# Patient Record
Sex: Female | Born: 1965 | Race: Black or African American | Hispanic: No | Marital: Single | Smoking: Current every day smoker
Health system: Southern US, Community
[De-identification: ages and names within clinical notes are randomized; demographics above are authoritative.]

## PROBLEM LIST (undated history)

## (undated) DIAGNOSIS — E8881 Metabolic syndrome: Secondary | ICD-10-CM

## (undated) DIAGNOSIS — Z923 Personal history of irradiation: Secondary | ICD-10-CM

## (undated) DIAGNOSIS — G4700 Insomnia, unspecified: Secondary | ICD-10-CM

## (undated) DIAGNOSIS — R519 Headache, unspecified: Secondary | ICD-10-CM

## (undated) DIAGNOSIS — R51 Headache: Secondary | ICD-10-CM

## (undated) DIAGNOSIS — C50919 Malignant neoplasm of unspecified site of unspecified female breast: Secondary | ICD-10-CM

## (undated) DIAGNOSIS — G629 Polyneuropathy, unspecified: Secondary | ICD-10-CM

## (undated) DIAGNOSIS — Z9221 Personal history of antineoplastic chemotherapy: Secondary | ICD-10-CM

## (undated) DIAGNOSIS — E669 Obesity, unspecified: Secondary | ICD-10-CM

## (undated) DIAGNOSIS — T7840XA Allergy, unspecified, initial encounter: Secondary | ICD-10-CM

## (undated) DIAGNOSIS — M1990 Unspecified osteoarthritis, unspecified site: Secondary | ICD-10-CM

## (undated) DIAGNOSIS — I10 Essential (primary) hypertension: Secondary | ICD-10-CM

## (undated) DIAGNOSIS — J449 Chronic obstructive pulmonary disease, unspecified: Secondary | ICD-10-CM

## (undated) HISTORY — DX: Insomnia, unspecified: G47.00

## (undated) HISTORY — DX: Polyneuropathy, unspecified: G62.9

## (undated) HISTORY — DX: Allergy, unspecified, initial encounter: T78.40XA

## (undated) HISTORY — DX: Metabolic syndrome: E88.81

## (undated) HISTORY — DX: Metabolic syndrome: E88.810

## (undated) HISTORY — PX: BREAST SURGERY: SHX581

## (undated) HISTORY — DX: Unspecified osteoarthritis, unspecified site: M19.90

## (undated) HISTORY — DX: Chronic obstructive pulmonary disease, unspecified: J44.9

## (undated) HISTORY — DX: Essential (primary) hypertension: I10

## (undated) HISTORY — DX: Obesity, unspecified: E66.9

---

## 2005-07-23 HISTORY — PX: BLADDER SURGERY: SHX569

## 2006-01-10 ENCOUNTER — Ambulatory Visit: Payer: Self-pay

## 2008-03-19 ENCOUNTER — Ambulatory Visit: Payer: Self-pay | Admitting: Family Medicine

## 2011-02-21 DIAGNOSIS — C50212 Malignant neoplasm of upper-inner quadrant of left female breast: Secondary | ICD-10-CM | POA: Insufficient documentation

## 2011-05-02 ENCOUNTER — Ambulatory Visit: Payer: Self-pay | Admitting: Family Medicine

## 2011-07-03 ENCOUNTER — Ambulatory Visit: Payer: Self-pay | Admitting: Surgery

## 2011-07-12 LAB — PATHOLOGY REPORT

## 2011-07-13 ENCOUNTER — Ambulatory Visit: Payer: Self-pay | Admitting: Oncology

## 2011-07-24 ENCOUNTER — Ambulatory Visit: Payer: Self-pay | Admitting: Oncology

## 2011-07-24 DIAGNOSIS — C50919 Malignant neoplasm of unspecified site of unspecified female breast: Secondary | ICD-10-CM

## 2011-07-24 HISTORY — PX: BREAST LUMPECTOMY: SHX2

## 2011-07-24 HISTORY — PX: BREAST BIOPSY: SHX20

## 2011-07-24 HISTORY — DX: Malignant neoplasm of unspecified site of unspecified female breast: C50.919

## 2011-07-27 ENCOUNTER — Ambulatory Visit: Payer: Self-pay | Admitting: Surgery

## 2011-07-27 DIAGNOSIS — I10 Essential (primary) hypertension: Secondary | ICD-10-CM

## 2011-07-27 LAB — CBC WITH DIFFERENTIAL/PLATELET
Basophil #: 0 10*3/uL (ref 0.0–0.1)
HGB: 12.6 g/dL (ref 12.0–16.0)
Lymphocyte #: 2.3 10*3/uL (ref 1.0–3.6)
MCHC: 32.9 g/dL (ref 32.0–36.0)
MCV: 86 fL (ref 80–100)
Monocyte %: 7.4 %
Neutrophil #: 4.7 10*3/uL (ref 1.4–6.5)
Neutrophil %: 59.1 %
RBC: 4.48 10*6/uL (ref 3.80–5.20)
RDW: 15.4 % — ABNORMAL HIGH (ref 11.5–14.5)

## 2011-07-27 LAB — PREGNANCY, URINE: Pregnancy Test, Urine: NEGATIVE m[IU]/mL

## 2011-07-27 LAB — BASIC METABOLIC PANEL
Anion Gap: 8 (ref 7–16)
BUN: 10 mg/dL (ref 7–18)
Co2: 27 mmol/L (ref 21–32)
Creatinine: 0.84 mg/dL (ref 0.60–1.30)
EGFR (African American): >60
EGFR (Non-African Amer.): >60
Glucose: 101 mg/dL — ABNORMAL HIGH (ref 65–99)
Osmolality: 284 (ref 275–301)
Sodium: 143 mmol/L (ref 136–145)

## 2011-08-24 ENCOUNTER — Ambulatory Visit: Payer: Self-pay | Admitting: Oncology

## 2011-08-30 ENCOUNTER — Ambulatory Visit: Payer: Self-pay | Admitting: Surgery

## 2011-08-30 LAB — CBC WITH DIFFERENTIAL/PLATELET
Basophil #: 0.1 10*3/uL (ref 0.0–0.1)
Basophil %: 0.6 %
Eosinophil #: 0.4 10*3/uL (ref 0.0–0.7)
Eosinophil %: 3.3 %
HGB: 12.9 g/dL (ref 12.0–16.0)
Lymphocyte #: 3.2 10*3/uL (ref 1.0–3.6)
Lymphocyte %: 29.3 %
MCH: 28.2 pg (ref 26.0–34.0)
Monocyte #: 0.6 10*3/uL (ref 0.0–0.7)
Monocyte %: 5.3 %
Neutrophil %: 61.5 %
Platelet: 275 10*3/uL (ref 150–440)
RBC: 4.56 10*6/uL (ref 3.80–5.20)
RDW: 14.8 % — ABNORMAL HIGH (ref 11.5–14.5)
WBC: 10.8 10*3/uL (ref 3.6–11.0)

## 2011-08-30 LAB — BASIC METABOLIC PANEL
Calcium, Total: 8.9 mg/dL (ref 8.5–10.1)
Chloride: 106 mmol/L (ref 98–107)
EGFR (Non-African Amer.): >60

## 2011-08-30 LAB — PREGNANCY, URINE: Pregnancy Test, Urine: NEGATIVE m[IU]/mL

## 2011-09-03 ENCOUNTER — Ambulatory Visit: Payer: Self-pay | Admitting: Surgery

## 2011-09-03 LAB — POTASSIUM: Potassium: 3.9 mmol/L (ref 3.5–5.1)

## 2011-09-03 LAB — PREGNANCY, URINE: Pregnancy Test, Urine: NEGATIVE m[IU]/mL

## 2011-09-14 LAB — BUN: BUN: 14 mg/dL (ref 7–18)

## 2011-09-14 LAB — CREATININE, SERUM
EGFR (African American): >60
EGFR (Non-African Amer.): >60

## 2011-09-19 ENCOUNTER — Ambulatory Visit: Payer: Self-pay | Admitting: Vascular Surgery

## 2011-09-21 ENCOUNTER — Ambulatory Visit: Payer: Self-pay | Admitting: Oncology

## 2011-09-21 LAB — CBC CANCER CENTER
Basophil #: 0.2 x10 3/mm — ABNORMAL HIGH (ref 0.0–0.1)
Basophil %: 1.8 %
Eosinophil #: 0.9 x10 3/mm — ABNORMAL HIGH (ref 0.0–0.7)
Eosinophil %: 9.8 %
HCT: 39.4 % (ref 35.0–47.0)
Lymphocyte #: 2.3 x10 3/mm (ref 1.0–3.6)
Lymphocyte %: 25.7 %
MCH: 27.9 pg (ref 26.0–34.0)
MCV: 84 fL (ref 80–100)
Monocyte #: 0.4 x10 3/mm (ref 0.0–0.7)
Platelet: 253 x10 3/mm (ref 150–440)
RBC: 4.72 10*6/uL (ref 3.80–5.20)
RDW: 15.3 % — ABNORMAL HIGH (ref 11.5–14.5)
WBC: 9.1 x10 3/mm (ref 3.6–11.0)

## 2011-09-21 LAB — COMPREHENSIVE METABOLIC PANEL
Albumin: 3.4 g/dL (ref 3.4–5.0)
Alkaline Phosphatase: 90 U/L (ref 50–136)
BUN: 11 mg/dL (ref 7–18)
Bilirubin,Total: 0.2 mg/dL (ref 0.2–1.0)
Calcium, Total: 8.6 mg/dL (ref 8.5–10.1)
Co2: 25 mmol/L (ref 21–32)
Creatinine: 0.99 mg/dL (ref 0.60–1.30)
EGFR (African American): >60
Osmolality: 282 (ref 275–301)
SGOT(AST): 20 U/L (ref 15–37)
SGPT (ALT): 35 U/L
Total Protein: 7 g/dL (ref 6.4–8.2)

## 2011-09-28 LAB — CBC CANCER CENTER
Basophil #: 0.1 x10 3/mm (ref 0.0–0.1)
Basophil %: 1.3 %
Eosinophil #: 0.2 x10 3/mm (ref 0.0–0.7)
HCT: 37.2 % (ref 35.0–47.0)
HGB: 12.6 g/dL (ref 12.0–16.0)
Lymphocyte #: 2.1 x10 3/mm (ref 1.0–3.6)
Lymphocyte %: 45.6 %
MCH: 28.1 pg (ref 26.0–34.0)
MCHC: 33.8 g/dL (ref 32.0–36.0)
Monocyte #: 0.1 x10 3/mm (ref 0.0–0.7)
Neutrophil #: 2.1 x10 3/mm (ref 1.4–6.5)
Neutrophil %: 46 %
RDW: 14.8 % — ABNORMAL HIGH (ref 11.5–14.5)

## 2011-09-28 LAB — BASIC METABOLIC PANEL
BUN: 11 mg/dL (ref 7–18)
Calcium, Total: 8.3 mg/dL — ABNORMAL LOW (ref 8.5–10.1)
Co2: 24 mmol/L (ref 21–32)
EGFR (Non-African Amer.): >60
Glucose: 120 mg/dL — ABNORMAL HIGH (ref 65–99)
Potassium: 3.5 mmol/L (ref 3.5–5.1)
Sodium: 139 mmol/L (ref 136–145)

## 2011-10-05 LAB — CBC CANCER CENTER
Basophil #: 0 x10 3/mm (ref 0.0–0.1)
Eosinophil #: 0 x10 3/mm (ref 0.0–0.7)
HCT: 36.5 % (ref 35.0–47.0)
Lymphocyte #: 1.9 x10 3/mm (ref 1.0–3.6)
Lymphocyte %: 14.8 %
MCHC: 32.9 g/dL (ref 32.0–36.0)
MCV: 84 fL (ref 80–100)
Neutrophil #: 10.5 x10 3/mm — ABNORMAL HIGH (ref 1.4–6.5)
RDW: 14.9 % — ABNORMAL HIGH (ref 11.5–14.5)

## 2011-10-05 LAB — COMPREHENSIVE METABOLIC PANEL
Albumin: 3.3 g/dL — ABNORMAL LOW (ref 3.4–5.0)
Alkaline Phosphatase: 93 U/L (ref 50–136)
Anion Gap: 11 (ref 7–16)
Calcium, Total: 8 mg/dL — ABNORMAL LOW (ref 8.5–10.1)
EGFR (African American): >60
Glucose: 136 mg/dL — ABNORMAL HIGH (ref 65–99)
SGOT(AST): 20 U/L (ref 15–37)
SGPT (ALT): 32 U/L

## 2011-10-18 LAB — CBC CANCER CENTER
Basophil #: 0 x10 3/mm (ref 0.0–0.1)
Eosinophil #: 0 x10 3/mm (ref 0.0–0.7)
Eosinophil %: 0.1 %
HCT: 37.4 % (ref 35.0–47.0)
HGB: 12.5 g/dL (ref 12.0–16.0)
Lymphocyte %: 12.2 %
MCH: 27.9 pg (ref 26.0–34.0)
MCHC: 33.5 g/dL (ref 32.0–36.0)
Monocyte #: 0.9 x10 3/mm — ABNORMAL HIGH (ref 0.0–0.7)
Monocyte %: 5.7 %
Neutrophil #: 13.2 x10 3/mm — ABNORMAL HIGH (ref 1.4–6.5)
Neutrophil %: 82 %
Platelet: 199 x10 3/mm (ref 150–440)
RBC: 4.48 10*6/uL (ref 3.80–5.20)
RDW: 15.7 % — ABNORMAL HIGH (ref 11.5–14.5)
WBC: 16.1 x10 3/mm — ABNORMAL HIGH (ref 3.6–11.0)

## 2011-10-18 LAB — COMPREHENSIVE METABOLIC PANEL
Albumin: 3.6 g/dL (ref 3.4–5.0)
BUN: 10 mg/dL (ref 7–18)
Bilirubin,Total: 0.2 mg/dL (ref 0.2–1.0)
Calcium, Total: 8.6 mg/dL (ref 8.5–10.1)
Chloride: 106 mmol/L (ref 98–107)
Co2: 27 mmol/L (ref 21–32)
Creatinine: 0.82 mg/dL (ref 0.60–1.30)
EGFR (African American): >60
Osmolality: 282 (ref 275–301)
Potassium: 4 mmol/L (ref 3.5–5.1)
SGPT (ALT): 38 U/L
Sodium: 142 mmol/L (ref 136–145)
Total Protein: 6.8 g/dL (ref 6.4–8.2)

## 2011-10-22 ENCOUNTER — Ambulatory Visit: Payer: Self-pay | Admitting: Oncology

## 2011-10-25 LAB — CBC CANCER CENTER
Basophil #: 0 x10 3/mm (ref 0.0–0.1)
Eosinophil #: 0 x10 3/mm (ref 0.0–0.7)
HCT: 35.3 % (ref 35.0–47.0)
HGB: 12 g/dL (ref 12.0–16.0)
MCH: 28.1 pg (ref 26.0–34.0)
MCHC: 33.9 g/dL (ref 32.0–36.0)
MCV: 83 fL (ref 80–100)
Monocyte #: 0.2 x10 3/mm (ref 0.0–0.7)
Monocyte %: 3.4 %
Neutrophil #: 3.4 x10 3/mm (ref 1.4–6.5)
Neutrophil %: 70.8 %
Platelet: 224 x10 3/mm (ref 150–440)
RBC: 4.26 10*6/uL (ref 3.80–5.20)
RDW: 16 % — ABNORMAL HIGH (ref 11.5–14.5)

## 2011-11-01 LAB — COMPREHENSIVE METABOLIC PANEL
Alkaline Phosphatase: 94 U/L (ref 50–136)
BUN: 11 mg/dL (ref 7–18)
Bilirubin,Total: 0.1 mg/dL — ABNORMAL LOW (ref 0.2–1.0)
Calcium, Total: 8.6 mg/dL (ref 8.5–10.1)
Chloride: 107 mmol/L (ref 98–107)
Co2: 27 mmol/L (ref 21–32)
Creatinine: 0.7 mg/dL (ref 0.60–1.30)
EGFR (Non-African Amer.): >60
Glucose: 97 mg/dL (ref 65–99)
Osmolality: 282 (ref 275–301)
Potassium: 4 mmol/L (ref 3.5–5.1)
SGOT(AST): 13 U/L — ABNORMAL LOW (ref 15–37)
SGPT (ALT): 24 U/L
Sodium: 142 mmol/L (ref 136–145)

## 2011-11-01 LAB — CBC CANCER CENTER
Basophil #: 0.1 x10 3/mm (ref 0.0–0.1)
Eosinophil #: 0.1 x10 3/mm (ref 0.0–0.7)
Lymphocyte #: 1.8 x10 3/mm (ref 1.0–3.6)
Lymphocyte %: 11.4 %
MCH: 27.5 pg (ref 26.0–34.0)
MCHC: 32.7 g/dL (ref 32.0–36.0)
Monocyte %: 8 %
Neutrophil #: 12.9 x10 3/mm — ABNORMAL HIGH (ref 1.4–6.5)
Neutrophil %: 79.9 %
Platelet: 218 x10 3/mm (ref 150–440)
RBC: 4.24 10*6/uL (ref 3.80–5.20)
WBC: 16.2 x10 3/mm — ABNORMAL HIGH (ref 3.6–11.0)

## 2011-11-08 LAB — CBC CANCER CENTER
Basophil %: 1 %
Eosinophil %: 0.6 %
HGB: 11.4 g/dL — ABNORMAL LOW (ref 12.0–16.0)
Lymphocyte #: 1.2 x10 3/mm (ref 1.0–3.6)
Lymphocyte %: 27.2 %
MCH: 27.4 pg (ref 26.0–34.0)
MCHC: 32.9 g/dL (ref 32.0–36.0)
MCV: 83 fL (ref 80–100)
Monocyte #: 0.3 x10 3/mm (ref 0.2–0.9)
Monocyte %: 6.7 %
Neutrophil #: 2.8 x10 3/mm (ref 1.4–6.5)
Neutrophil %: 64.5 %
Platelet: 222 x10 3/mm (ref 150–440)
WBC: 4.3 x10 3/mm (ref 3.6–11.0)

## 2011-11-15 LAB — CBC CANCER CENTER
Basophil #: 0 x10 3/mm (ref 0.0–0.1)
Eosinophil #: 0 x10 3/mm (ref 0.0–0.7)
HCT: 35.4 % (ref 35.0–47.0)
HGB: 11.1 g/dL — ABNORMAL LOW (ref 12.0–16.0)
Lymphocyte #: 1.8 x10 3/mm (ref 1.0–3.6)
MCHC: 31.5 g/dL — ABNORMAL LOW (ref 32.0–36.0)
Monocyte #: 1.4 x10 3/mm — ABNORMAL HIGH (ref 0.2–0.9)
Monocyte %: 9.5 %
Neutrophil #: 11.3 x10 3/mm — ABNORMAL HIGH (ref 1.4–6.5)
Neutrophil %: 77.4 %
Platelet: 203 x10 3/mm (ref 150–440)
RDW: 17.5 % — ABNORMAL HIGH (ref 11.5–14.5)
WBC: 14.6 x10 3/mm — ABNORMAL HIGH (ref 3.6–11.0)

## 2011-11-15 LAB — COMPREHENSIVE METABOLIC PANEL
Albumin: 3.4 g/dL (ref 3.4–5.0)
Alkaline Phosphatase: 92 U/L (ref 50–136)
BUN: 10 mg/dL (ref 7–18)
Bilirubin,Total: 0.2 mg/dL (ref 0.2–1.0)
Calcium, Total: 8.5 mg/dL (ref 8.5–10.1)
Chloride: 106 mmol/L (ref 98–107)
Co2: 26 mmol/L (ref 21–32)
Creatinine: 0.66 mg/dL (ref 0.60–1.30)
EGFR (Non-African Amer.): >60
Glucose: 87 mg/dL (ref 65–99)
Potassium: 3.8 mmol/L (ref 3.5–5.1)
SGOT(AST): 12 U/L — ABNORMAL LOW (ref 15–37)
SGPT (ALT): 23 U/L
Total Protein: 6.4 g/dL (ref 6.4–8.2)

## 2011-11-21 ENCOUNTER — Ambulatory Visit: Payer: Self-pay | Admitting: Oncology

## 2011-11-22 LAB — CBC CANCER CENTER
Basophil #: 0.1 x10 3/mm (ref 0.0–0.1)
Eosinophil #: 0.1 x10 3/mm (ref 0.0–0.7)
HGB: 11.2 g/dL — ABNORMAL LOW (ref 12.0–16.0)
Lymphocyte #: 1.4 x10 3/mm (ref 1.0–3.6)
Lymphocyte %: 23.4 %
MCH: 27.7 pg (ref 26.0–34.0)
Neutrophil #: 3.8 x10 3/mm (ref 1.4–6.5)
RDW: 18.2 % — ABNORMAL HIGH (ref 11.5–14.5)

## 2011-11-22 LAB — COMPREHENSIVE METABOLIC PANEL
Alkaline Phosphatase: 77 U/L (ref 50–136)
Anion Gap: 9 (ref 7–16)
BUN: 14 mg/dL (ref 7–18)
Bilirubin,Total: 0.4 mg/dL (ref 0.2–1.0)
Co2: 28 mmol/L (ref 21–32)
Creatinine: 0.82 mg/dL (ref 0.60–1.30)
EGFR (Non-African Amer.): >60
Glucose: 109 mg/dL — ABNORMAL HIGH (ref 65–99)
Sodium: 141 mmol/L (ref 136–145)
Total Protein: 6.5 g/dL (ref 6.4–8.2)

## 2011-11-29 LAB — CBC CANCER CENTER
Basophil %: 1.4 %
Eosinophil #: 0.3 x10 3/mm (ref 0.0–0.7)
Eosinophil %: 3.2 %
HGB: 11.5 g/dL — ABNORMAL LOW (ref 12.0–16.0)
Lymphocyte %: 20.3 %
MCH: 28.5 pg (ref 26.0–34.0)
Monocyte %: 8.3 %
Neutrophil #: 5.4 x10 3/mm (ref 1.4–6.5)
Neutrophil %: 66.8 %
RDW: 20.1 % — ABNORMAL HIGH (ref 11.5–14.5)
WBC: 8.1 x10 3/mm (ref 3.6–11.0)

## 2011-11-29 LAB — COMPREHENSIVE METABOLIC PANEL
BUN: 11 mg/dL (ref 7–18)
Bilirubin,Total: 0.3 mg/dL (ref 0.2–1.0)
Calcium, Total: 8.4 mg/dL — ABNORMAL LOW (ref 8.5–10.1)
Chloride: 105 mmol/L (ref 98–107)
Co2: 27 mmol/L (ref 21–32)
Glucose: 112 mg/dL — ABNORMAL HIGH (ref 65–99)
Potassium: 3.9 mmol/L (ref 3.5–5.1)
SGOT(AST): 37 U/L (ref 15–37)
SGPT (ALT): 82 U/L — ABNORMAL HIGH

## 2011-12-06 LAB — CBC CANCER CENTER
Basophil #: 0 x10 3/mm (ref 0.0–0.1)
Basophil %: 0.2 %
Eosinophil #: 0.4 x10 3/mm (ref 0.0–0.7)
Eosinophil %: 3.9 %
HCT: 35.6 % (ref 35.0–47.0)
HGB: 11.6 g/dL — ABNORMAL LOW (ref 12.0–16.0)
Lymphocyte #: 1.9 x10 3/mm (ref 1.0–3.6)
MCH: 28.4 pg (ref 26.0–34.0)
MCV: 87 fL (ref 80–100)
Monocyte #: 0.8 x10 3/mm (ref 0.2–0.9)
Neutrophil #: 5.9 x10 3/mm (ref 1.4–6.5)
Neutrophil %: 65.8 %
Platelet: 278 x10 3/mm (ref 150–440)
RBC: 4.1 10*6/uL (ref 3.80–5.20)
RDW: 20 % — ABNORMAL HIGH (ref 11.5–14.5)
WBC: 9 x10 3/mm (ref 3.6–11.0)

## 2011-12-06 LAB — COMPREHENSIVE METABOLIC PANEL
Anion Gap: 10 (ref 7–16)
BUN: 12 mg/dL (ref 7–18)
Creatinine: 0.77 mg/dL (ref 0.60–1.30)
EGFR (Non-African Amer.): >60
SGOT(AST): 39 U/L — ABNORMAL HIGH (ref 15–37)
Total Protein: 6.9 g/dL (ref 6.4–8.2)

## 2011-12-13 LAB — CBC CANCER CENTER
Basophil %: 0.8 %
Eosinophil #: 0.4 x10 3/mm (ref 0.0–0.7)
Eosinophil %: 3.9 %
HCT: 37 % (ref 35.0–47.0)
MCH: 27.8 pg (ref 26.0–34.0)
Monocyte #: 0.8 x10 3/mm (ref 0.2–0.9)
Monocyte %: 8.4 %
Neutrophil #: 6.5 x10 3/mm (ref 1.4–6.5)
Neutrophil %: 69 %
Platelet: 268 x10 3/mm (ref 150–440)
RDW: 19.8 % — ABNORMAL HIGH (ref 11.5–14.5)

## 2011-12-13 LAB — COMPREHENSIVE METABOLIC PANEL
Alkaline Phosphatase: 89 U/L (ref 50–136)
Bilirubin,Total: 0.3 mg/dL (ref 0.2–1.0)
Calcium, Total: 8.9 mg/dL (ref 8.5–10.1)
Creatinine: 0.72 mg/dL (ref 0.60–1.30)
EGFR (African American): >60
EGFR (Non-African Amer.): >60
Glucose: 124 mg/dL — ABNORMAL HIGH (ref 65–99)
Osmolality: 285 (ref 275–301)
SGOT(AST): 28 U/L (ref 15–37)
Total Protein: 6.9 g/dL (ref 6.4–8.2)

## 2011-12-21 LAB — CBC CANCER CENTER
Basophil #: 0.1 x10 3/mm (ref 0.0–0.1)
Basophil %: 0.5 %
Eosinophil %: 2.4 %
HCT: 36 % (ref 35.0–47.0)
HGB: 11.7 g/dL — ABNORMAL LOW (ref 12.0–16.0)
Lymphocyte #: 1.9 x10 3/mm (ref 1.0–3.6)
Lymphocyte %: 18.8 %
Monocyte %: 6.1 %
Neutrophil %: 72.2 %
Platelet: 318 x10 3/mm (ref 150–440)
WBC: 10.1 x10 3/mm (ref 3.6–11.0)

## 2011-12-21 LAB — COMPREHENSIVE METABOLIC PANEL
Albumin: 3.5 g/dL (ref 3.4–5.0)
Anion Gap: 9 (ref 7–16)
BUN: 10 mg/dL (ref 7–18)
Chloride: 108 mmol/L — ABNORMAL HIGH (ref 98–107)
Creatinine: 0.65 mg/dL (ref 0.60–1.30)
EGFR (African American): >60
Glucose: 137 mg/dL — ABNORMAL HIGH (ref 65–99)
Osmolality: 284 (ref 275–301)
SGOT(AST): 35 U/L (ref 15–37)
SGPT (ALT): 58 U/L

## 2011-12-22 ENCOUNTER — Ambulatory Visit: Payer: Self-pay | Admitting: Oncology

## 2011-12-28 LAB — CBC CANCER CENTER
Basophil #: 0.1 x10 3/mm (ref 0.0–0.1)
Eosinophil %: 3.5 %
HGB: 11.9 g/dL — ABNORMAL LOW (ref 12.0–16.0)
Lymphocyte #: 1.6 x10 3/mm (ref 1.0–3.6)
MCHC: 32.3 g/dL (ref 32.0–36.0)
MCV: 89 fL (ref 80–100)
Neutrophil #: 4.1 x10 3/mm (ref 1.4–6.5)
Neutrophil %: 62.8 %
Platelet: 307 x10 3/mm (ref 150–440)
RBC: 4.13 10*6/uL (ref 3.80–5.20)
RDW: 18.7 % — ABNORMAL HIGH (ref 11.5–14.5)

## 2011-12-28 LAB — COMPREHENSIVE METABOLIC PANEL
Albumin: 3.5 g/dL (ref 3.4–5.0)
Anion Gap: 7 (ref 7–16)
Calcium, Total: 8.9 mg/dL (ref 8.5–10.1)
Creatinine: 0.68 mg/dL (ref 0.60–1.30)
EGFR (African American): >60
Potassium: 3.9 mmol/L (ref 3.5–5.1)
SGOT(AST): 29 U/L (ref 15–37)
SGPT (ALT): 61 U/L
Total Protein: 6.7 g/dL (ref 6.4–8.2)

## 2012-01-04 LAB — CBC CANCER CENTER
HCT: 37.1 % (ref 35.0–47.0)
HGB: 12 g/dL (ref 12.0–16.0)
Lymphocyte #: 1.8 x10 3/mm (ref 1.0–3.6)
Lymphocyte %: 24 %
MCH: 28.9 pg (ref 26.0–34.0)
Monocyte #: 0.5 x10 3/mm (ref 0.2–0.9)
Neutrophil #: 4.9 x10 3/mm (ref 1.4–6.5)
Platelet: 309 x10 3/mm (ref 150–440)
RBC: 4.15 10*6/uL (ref 3.80–5.20)
RDW: 18.8 % — ABNORMAL HIGH (ref 11.5–14.5)

## 2012-01-04 LAB — COMPREHENSIVE METABOLIC PANEL
BUN: 11 mg/dL (ref 7–18)
Calcium, Total: 8.7 mg/dL (ref 8.5–10.1)
Chloride: 105 mmol/L (ref 98–107)
Co2: 27 mmol/L (ref 21–32)
EGFR (Non-African Amer.): >60
Glucose: 128 mg/dL — ABNORMAL HIGH (ref 65–99)
Osmolality: 280 (ref 275–301)
Potassium: 3.9 mmol/L (ref 3.5–5.1)
SGOT(AST): 39 U/L — ABNORMAL HIGH (ref 15–37)
SGPT (ALT): 77 U/L
Sodium: 140 mmol/L (ref 136–145)
Total Protein: 6.8 g/dL (ref 6.4–8.2)

## 2012-01-11 LAB — COMPREHENSIVE METABOLIC PANEL
Albumin: 3.6 g/dL (ref 3.4–5.0)
Alkaline Phosphatase: 85 U/L (ref 50–136)
Anion Gap: 7 (ref 7–16)
BUN: 15 mg/dL (ref 7–18)
Bilirubin,Total: 0.3 mg/dL (ref 0.2–1.0)
Chloride: 103 mmol/L (ref 98–107)
Creatinine: 0.87 mg/dL (ref 0.60–1.30)
Osmolality: 279 (ref 275–301)
Potassium: 4 mmol/L (ref 3.5–5.1)
SGOT(AST): 21 U/L (ref 15–37)
Total Protein: 7 g/dL (ref 6.4–8.2)

## 2012-01-11 LAB — CBC CANCER CENTER
Basophil #: 0 x10 3/mm (ref 0.0–0.1)
Eosinophil #: 0.1 x10 3/mm (ref 0.0–0.7)
Eosinophil %: 1.6 %
HCT: 39.3 % (ref 35.0–47.0)
Lymphocyte %: 27.5 %
MCHC: 33 g/dL (ref 32.0–36.0)
MCV: 90 fL (ref 80–100)
Monocyte %: 6.7 %
Neutrophil %: 64 %
Platelet: 345 x10 3/mm (ref 150–440)
RBC: 4.39 10*6/uL (ref 3.80–5.20)
RDW: 18.3 % — ABNORMAL HIGH (ref 11.5–14.5)
WBC: 8.5 x10 3/mm (ref 3.6–11.0)

## 2012-01-17 LAB — COMPREHENSIVE METABOLIC PANEL
Albumin: 3.4 g/dL (ref 3.4–5.0)
Alkaline Phosphatase: 87 U/L (ref 50–136)
Anion Gap: 8 (ref 7–16)
BUN: 13 mg/dL (ref 7–18)
Calcium, Total: 8.5 mg/dL (ref 8.5–10.1)
Chloride: 105 mmol/L (ref 98–107)
Co2: 27 mmol/L (ref 21–32)
Creatinine: 0.77 mg/dL (ref 0.60–1.30)
EGFR (African American): >60
EGFR (Non-African Amer.): >60
Osmolality: 281 (ref 275–301)
SGOT(AST): 21 U/L (ref 15–37)
SGPT (ALT): 43 U/L
Sodium: 140 mmol/L (ref 136–145)
Total Protein: 6.5 g/dL (ref 6.4–8.2)

## 2012-01-17 LAB — CBC CANCER CENTER
Basophil #: 0.1 x10 3/mm (ref 0.0–0.1)
Basophil %: 1 %
Eosinophil %: 1.5 %
HCT: 37.7 % (ref 35.0–47.0)
Lymphocyte #: 2 x10 3/mm (ref 1.0–3.6)
MCH: 29 pg (ref 26.0–34.0)
MCHC: 31.9 g/dL — ABNORMAL LOW (ref 32.0–36.0)
MCV: 91 fL (ref 80–100)
Monocyte #: 0.4 x10 3/mm (ref 0.2–0.9)
Neutrophil %: 60.9 %
Platelet: 306 x10 3/mm (ref 150–440)
WBC: 6.5 x10 3/mm (ref 3.6–11.0)

## 2012-01-21 ENCOUNTER — Ambulatory Visit: Payer: Self-pay | Admitting: Oncology

## 2012-01-25 LAB — CBC CANCER CENTER
Basophil #: 0 x10 3/mm (ref 0.0–0.1)
Basophil %: 0.4 %
Eosinophil #: 0.2 x10 3/mm (ref 0.0–0.7)
Eosinophil %: 2.2 %
HGB: 11.6 g/dL — ABNORMAL LOW (ref 12.0–16.0)
Lymphocyte %: 21.3 %
MCV: 91 fL (ref 80–100)
Monocyte %: 5.3 %
Neutrophil %: 70.8 %
RBC: 3.98 10*6/uL (ref 3.80–5.20)
WBC: 8.4 x10 3/mm (ref 3.6–11.0)

## 2012-01-25 LAB — COMPREHENSIVE METABOLIC PANEL
Anion Gap: 9 (ref 7–16)
Bilirubin,Total: 0.2 mg/dL (ref 0.2–1.0)
Calcium, Total: 8.7 mg/dL (ref 8.5–10.1)
Chloride: 105 mmol/L (ref 98–107)
Co2: 25 mmol/L (ref 21–32)
Creatinine: 0.87 mg/dL (ref 0.60–1.30)
EGFR (African American): >60
EGFR (Non-African Amer.): >60
Glucose: 148 mg/dL — ABNORMAL HIGH (ref 65–99)
Osmolality: 279 (ref 275–301)
Potassium: 3.6 mmol/L (ref 3.5–5.1)
Sodium: 139 mmol/L (ref 136–145)

## 2012-02-20 LAB — CBC CANCER CENTER
Basophil #: 0.1 x10 3/mm (ref 0.0–0.1)
Eosinophil %: 6.5 %
HGB: 13.3 g/dL (ref 12.0–16.0)
Lymphocyte #: 2.2 x10 3/mm (ref 1.0–3.6)
Lymphocyte %: 26.1 %
MCH: 29.9 pg (ref 26.0–34.0)
MCV: 88 fL (ref 80–100)
Monocyte %: 6.1 %
Neutrophil %: 60.6 %
Platelet: 327 x10 3/mm (ref 150–440)
RBC: 4.45 10*6/uL (ref 3.80–5.20)
RDW: 14.3 % (ref 11.5–14.5)
WBC: 8.4 x10 3/mm (ref 3.6–11.0)

## 2012-02-21 ENCOUNTER — Ambulatory Visit: Payer: Self-pay | Admitting: Oncology

## 2012-03-05 LAB — CBC CANCER CENTER
Basophil %: 1.1 %
Eosinophil #: 0.3 x10 3/mm (ref 0.0–0.7)
HCT: 41.5 % (ref 35.0–47.0)
HGB: 13.2 g/dL (ref 12.0–16.0)
Lymphocyte %: 22.6 %
MCHC: 31.8 g/dL — ABNORMAL LOW (ref 32.0–36.0)
MCV: 88 fL (ref 80–100)
Monocyte %: 5.6 %
Neutrophil #: 4.6 x10 3/mm (ref 1.4–6.5)
Neutrophil %: 65.8 %
WBC: 6.9 x10 3/mm (ref 3.6–11.0)

## 2012-03-12 LAB — CBC CANCER CENTER
Basophil #: 0 x10 3/mm (ref 0.0–0.1)
Eosinophil #: 0.3 x10 3/mm (ref 0.0–0.7)
Eosinophil %: 5 %
HCT: 41.1 % (ref 35.0–47.0)
MCH: 28.2 pg (ref 26.0–34.0)
MCHC: 32.2 g/dL (ref 32.0–36.0)
MCV: 88 fL (ref 80–100)
Monocyte #: 0.3 x10 3/mm (ref 0.2–0.9)
Neutrophil %: 71 %
Platelet: 258 x10 3/mm (ref 150–440)
RDW: 14.1 % (ref 11.5–14.5)
WBC: 6.6 x10 3/mm (ref 3.6–11.0)

## 2012-03-19 LAB — CBC CANCER CENTER
Eosinophil %: 5.3 %
HCT: 41.2 % (ref 35.0–47.0)
Lymphocyte #: 1.1 x10 3/mm (ref 1.0–3.6)
Lymphocyte %: 17.2 %
MCV: 87 fL (ref 80–100)
Monocyte %: 6.2 %
Neutrophil #: 4.5 x10 3/mm (ref 1.4–6.5)
Platelet: 251 x10 3/mm (ref 150–440)
RBC: 4.72 10*6/uL (ref 3.80–5.20)
RDW: 14.4 % (ref 11.5–14.5)
WBC: 6.3 x10 3/mm (ref 3.6–11.0)

## 2012-03-23 ENCOUNTER — Ambulatory Visit: Payer: Self-pay | Admitting: Oncology

## 2012-03-26 LAB — CBC CANCER CENTER
Basophil #: 0.1 x10 3/mm (ref 0.0–0.1)
Eosinophil #: 0.3 x10 3/mm (ref 0.0–0.7)
Eosinophil %: 4.9 %
HCT: 40.9 % (ref 35.0–47.0)
Lymphocyte #: 1.1 x10 3/mm (ref 1.0–3.6)
MCHC: 31.8 g/dL — ABNORMAL LOW (ref 32.0–36.0)
MCV: 87 fL (ref 80–100)
Monocyte %: 8.5 %
Neutrophil #: 4.7 x10 3/mm (ref 1.4–6.5)
Neutrophil %: 69.9 %
Platelet: 264 x10 3/mm (ref 150–440)
RDW: 14.4 % (ref 11.5–14.5)
WBC: 6.7 x10 3/mm (ref 3.6–11.0)

## 2012-04-02 LAB — CBC CANCER CENTER
Basophil %: 1.3 %
Eosinophil #: 0.3 x10 3/mm (ref 0.0–0.7)
Eosinophil %: 4.7 %
HCT: 40.3 % (ref 35.0–47.0)
HGB: 12.9 g/dL (ref 12.0–16.0)
MCH: 27.8 pg (ref 26.0–34.0)
MCHC: 32.1 g/dL (ref 32.0–36.0)
Monocyte #: 0.6 x10 3/mm (ref 0.2–0.9)
Monocyte %: 10 %
Neutrophil #: 4.2 x10 3/mm (ref 1.4–6.5)
Neutrophil %: 66.7 %
RBC: 4.65 10*6/uL (ref 3.80–5.20)
RDW: 14.4 % (ref 11.5–14.5)

## 2012-04-09 LAB — CBC CANCER CENTER
Basophil %: 0.8 %
Eosinophil #: 0.3 x10 3/mm (ref 0.0–0.7)
Eosinophil %: 3.8 %
HCT: 39.2 % (ref 35.0–47.0)
Lymphocyte #: 1.2 x10 3/mm (ref 1.0–3.6)
MCV: 85 fL (ref 80–100)
Monocyte #: 0.7 x10 3/mm (ref 0.2–0.9)
Neutrophil #: 4.7 x10 3/mm (ref 1.4–6.5)
Neutrophil %: 67.9 %
Platelet: 248 x10 3/mm (ref 150–440)
RBC: 4.6 10*6/uL (ref 3.80–5.20)
RDW: 14.7 % — ABNORMAL HIGH (ref 11.5–14.5)
WBC: 6.9 x10 3/mm (ref 3.6–11.0)

## 2012-04-22 ENCOUNTER — Ambulatory Visit: Payer: Self-pay | Admitting: Oncology

## 2012-05-08 ENCOUNTER — Ambulatory Visit: Payer: Self-pay | Admitting: Family Medicine

## 2012-05-17 IMAGING — US US NEEDLE LOCALIZATION*L*
1 series · 4 of 4 positions shown · non-contrast
Comparison: none

REASON FOR EXAM: L  breast mass L partial mastectomy SN bx  422am
surgery at 11am
COMMENTS:

[Series 1: us needle localization*left* · 4 of 4 slices shown]
[im 1/4]
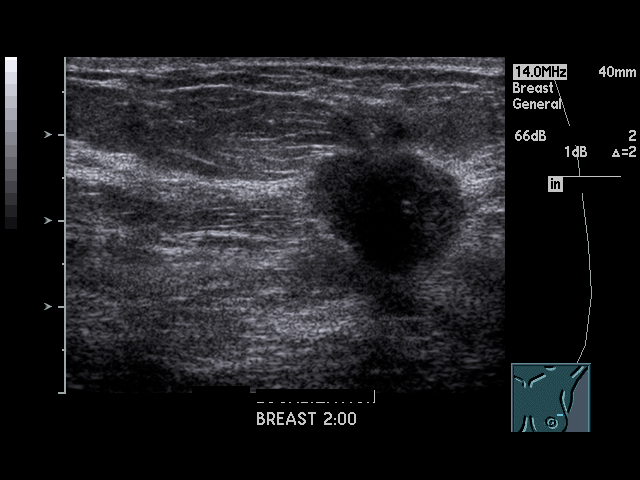
[im 2/4]
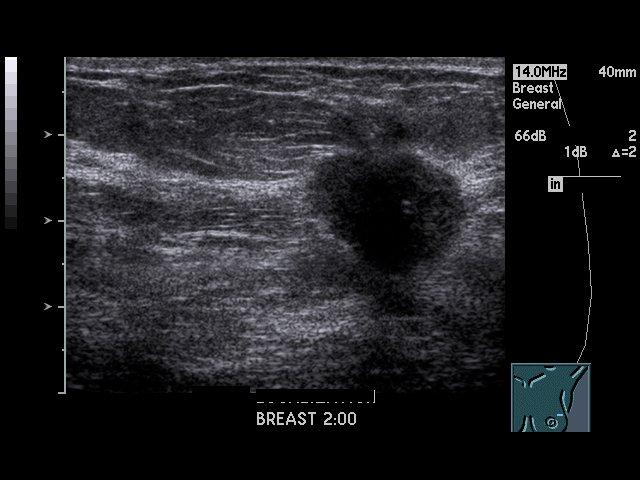
[im 3/4]
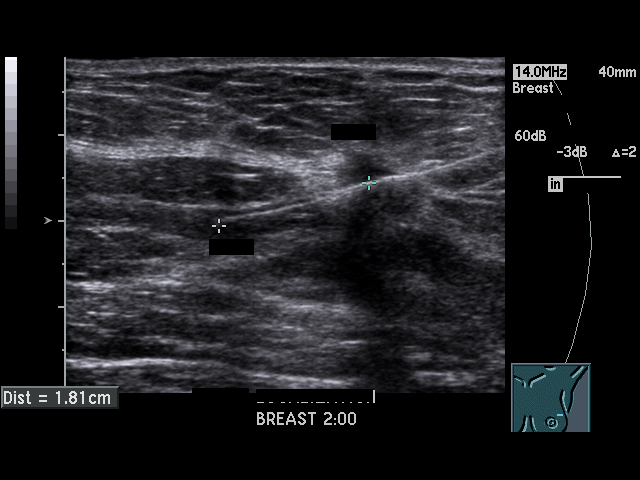
[im 4/4]
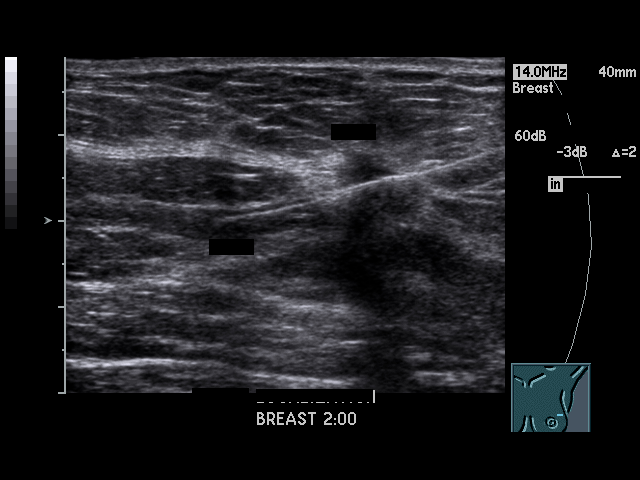

[4 of 4 positions shown; findings below may reference images not displayed]

PROCEDURE:     US  - US GUIDED NEEDLE LOCAL L BREAST  - September 03, 2011 [DATE]

RESULT:

Ultrasound directed needle localization was performed for a left breast
lesion. This was performed under sterile technique with local anesthesia of
1% Lidocaine. Kopans needle hook-wire was advanced into the left breast
lesion and the hook-wire left in place. There were no complications.
IMPRESSION: Successful needle localization.

## 2012-05-23 ENCOUNTER — Ambulatory Visit: Payer: Self-pay | Admitting: Oncology

## 2012-05-29 ENCOUNTER — Encounter: Payer: Self-pay | Admitting: Radiation Oncology

## 2012-06-22 ENCOUNTER — Ambulatory Visit: Payer: Self-pay | Admitting: Oncology

## 2012-06-22 ENCOUNTER — Encounter: Payer: Self-pay | Admitting: Radiation Oncology

## 2012-07-23 ENCOUNTER — Ambulatory Visit: Payer: Self-pay | Admitting: Oncology

## 2012-07-23 ENCOUNTER — Encounter: Payer: Self-pay | Admitting: Radiation Oncology

## 2012-08-12 ENCOUNTER — Ambulatory Visit: Payer: Self-pay | Admitting: Surgery

## 2012-08-23 ENCOUNTER — Ambulatory Visit: Payer: Self-pay | Admitting: Oncology

## 2012-08-23 ENCOUNTER — Encounter: Payer: Self-pay | Admitting: Radiation Oncology

## 2012-09-09 LAB — LIPID PANEL
Cholesterol: 149 mg/dL (ref 0–200)
HDL: 40 mg/dL (ref 35–70)
LDL Cholesterol: 94 mg/dL
TRIGLYCERIDES: 77 mg/dL (ref 40–160)

## 2012-09-20 ENCOUNTER — Ambulatory Visit: Payer: Self-pay | Admitting: Oncology

## 2012-10-21 ENCOUNTER — Ambulatory Visit: Payer: Self-pay | Admitting: Oncology

## 2012-11-20 ENCOUNTER — Ambulatory Visit: Payer: Self-pay | Admitting: Oncology

## 2012-12-11 LAB — HM PAP SMEAR: HM Pap smear: NORMAL

## 2012-12-21 ENCOUNTER — Ambulatory Visit: Payer: Self-pay | Admitting: Oncology

## 2013-01-20 ENCOUNTER — Ambulatory Visit: Payer: Self-pay | Admitting: Oncology

## 2013-02-20 ENCOUNTER — Ambulatory Visit: Payer: Self-pay | Admitting: Oncology

## 2013-04-21 ENCOUNTER — Ambulatory Visit: Payer: Self-pay | Admitting: Oncology

## 2013-05-05 ENCOUNTER — Ambulatory Visit: Payer: Self-pay | Admitting: Oncology

## 2013-05-06 LAB — CANCER ANTIGEN 27.29: CA 27.29: 9.7 U/mL (ref 0.0–38.6)

## 2013-05-14 ENCOUNTER — Ambulatory Visit: Payer: Self-pay | Admitting: Vascular Surgery

## 2013-05-23 ENCOUNTER — Ambulatory Visit: Payer: Self-pay | Admitting: Oncology

## 2013-08-13 ENCOUNTER — Ambulatory Visit: Payer: Self-pay | Admitting: Oncology

## 2013-08-16 LAB — HM MAMMOGRAPHY: HM MAMMO: NORMAL

## 2013-09-04 ENCOUNTER — Ambulatory Visit: Payer: Self-pay | Admitting: Oncology

## 2013-09-07 LAB — CANCER ANTIGEN 27.29: CA 27.29: 13.6 U/mL

## 2013-09-20 ENCOUNTER — Ambulatory Visit: Payer: Self-pay | Admitting: Oncology

## 2013-10-06 LAB — HEMOGLOBIN A1C: Hgb A1c MFr Bld: 5.8 % (ref 4.0–6.0)

## 2014-03-19 ENCOUNTER — Ambulatory Visit: Payer: Self-pay | Admitting: Oncology

## 2014-03-22 LAB — CANCER ANTIGEN 27.29: CA 27.29: 6.6 U/mL (ref 0.0–38.6)

## 2014-03-23 ENCOUNTER — Ambulatory Visit: Payer: Self-pay | Admitting: Oncology

## 2014-09-29 ENCOUNTER — Ambulatory Visit
Admit: 2014-09-29 | Disposition: A | Discharge status: Home / Self Care | Payer: Self-pay | Attending: Oncology | Admitting: Oncology

## 2014-10-22 ENCOUNTER — Ambulatory Visit
Admit: 2014-10-22 | Disposition: A | Discharge status: Home / Self Care | Payer: Self-pay | Attending: Oncology | Admitting: Oncology

## 2014-11-12 NOTE — Op Note (Signed)
PATIENT NAME:  Brenda Jones, Brenda Jones MR#:  [id number] DATE OF BIRTH:  [birth year]  DATE OF PROCEDURE:  05/14/2013  PREOPERATIVE DIAGNOSIS: 1.  Breast cancer, completed chemotherapy.   POSTOPERATIVE DIAGNOSIS:   1.  Breast cancer, completed chemotherapy.  PROCEDURE: Removal of right jugular Port-A-Cath.   SURGEON: Leotis Pain, M.D.   ANESTHESIA: Local with monitored conscious sedation.   ESTIMATED BLOOD LOSS: Minimal.   INDICATION FOR PROCEDURE: A 49 year old [place] American female who has completed her therapy for breast cancer and desires to have her port removed.   DESCRIPTION OF PROCEDURE: The patient's right chest was sterilely prepped and draped and a sterile surgical field was created. The area was copiously anesthetized with 1% lidocaine. The previous subclavicular incision was then reopened. The catheter and port were dissected out, and the sutures securing the port to the chest wall were removed. The port and the catheter were then removed in their entirety without difficulty with gentle traction. The wound was then closed with 3-0 Vicryl and 4-0 Monocryl, and Dermabond was placed as a dressing. The patient tolerated the procedure well and was taken to the recovery room in stable condition.    ____________________________ Algernon Huxley, MD jsd:dmm D: 05/14/2013 09:42:25 ET T: 05/14/2013 10:04:06 ET JOB#: [id number]  cc: Algernon Huxley, MD, <Dictator> Algernon Huxley MD ELECTRONICALLY SIGNED 05/20/2013 13:49

## 2014-11-14 NOTE — Op Note (Signed)
PATIENT NAME:  Brenda Jones, Brenda Jones MR#:  [id number] DATE OF BIRTH:  1966-05-02  DATE OF PROCEDURE:  09/03/2011  PREOPERATIVE DIAGNOSIS: Left breast carcinoma.   POSTOPERATIVE DIAGNOSIS: Left breast carcinoma.   PROCEDURE:  1. Left breast partial mastectomy with ultrasound-guided needle placement.  2. Left axillary lymph node dissection.   SURGEON: Hortencia Conradi, M.D.   ASSISTANT: Nestor Lewandowsky, M.D.   TYPE OF ANESTHESIA: General and local.   SPECIMENS:  Portion of left breast, axillary contents.   DRAINS: Jackson-Pratt axilla.   ESTIMATED BLOOD LOSS: 50 mL.   DESCRIPTION OF PROCEDURE: With the patient in the supine position, general anesthesia with LMA airway was induced. The patient's left arm, axilla, and left breast were sterilely prepped and draped with ChloraPrep solution. Time-out procedure was observed.   Interrogation with the hand-held Neoprobe in the axilla demonstrated no counts through the skin. This correlated to the absence of radiotracer seen in the axilla following injection of the breast.   The needle entrance site was in the left upper outer quadrant. A curvilinear incision was fashioned over the area of the mass with a scalpel and flaps were raised. The wire was incorporated into the incision. A circumferential excision of breast tissue surrounding the mass and encompassing the wire entrance site was fashioned with electrocautery and the specimen was oriented for pathology with margin markers.   An additional anterior margin was excised and submitted to pathology following a 2-mm margin.   The axillary contents were then delivered through the same incision by incising the lateral border of the pectoralis major muscle and identifying the course of the axillary vein and its inferior border. Subscapularis muscle was exposed along with the course of the thoracodorsal nerve and long thoracic nerve. Bulky lymphadenopathy was encountered and this was swept inferiorly with care to  avoid injury to the nerves and vessels. The axillary contents were delivered with a combination of cautery and point application of the Harmonic focus device.   With the axillary contents delivered and hemostasis being obtained on the operative field, a 19-mm Blake drain was directed into the axillary contents and exited the lower chest. Drain site was secured with nylon. Several hemoclips were placed at the base of the biopsy cavity for future mammographic detection. With hemostasis being ensured on the operative field, the wound was then closed with interrupted 3-0 Vicryls in the breast layer and interrupted inverted 3-0 Vicryl in the deep dermal layer followed by running 4-0 Monocryl. Benzoin, Steri-Strips, 4 x 4's, and Tegaderm, and a compressive postmastectomy bra was applied and the patient was subsequently taken to the recovery room, extubated, and taken to anesthesia in satisfactory and stable condition.  ____________________________ Jeannette How Marina Gravel, MD mab:bjt D: 09/04/2011 09:42:15 ET T: 09/04/2011 10:15:33 ET JOB#: [id number]  cc: Elta Guadeloupe A. Marina Gravel, MD, <Dictator> Kathlene November. Grayland Ormond, MD Hortencia Conradi MD ELECTRONICALLY SIGNED 09/05/2011 16:11

## 2014-11-14 NOTE — Op Note (Signed)
PATIENT NAME:  Brenda Jones, BLATZ MR#:  [id number] DATE OF BIRTH:  22-Jul-1966  DATE OF PROCEDURE:  09/19/2011  PREOPERATIVE DIAGNOSIS: Breast cancer.   POSTOPERATIVE DIAGNOSIS:  Breast cancer.  PROCEDURE: 1. Ultrasound guidance for vascular access, right jugular vein.  2. Fluoroscopic guidance for placement of catheter.  3. Placement of CT compatible Infuse-a-Port, right jugular vein.   SURGEON: Algernon Huxley, M.D.   ANESTHESIA: Local with moderate conscious sedation.   ESTIMATED BLOOD LOSS: Minimal.   INDICATION FOR PROCEDURE: This is a 49 year old [place] American female with recently diagnosed left breast cancer. She needs a Port-A-Cath for chemotherapy. Risks and benefits were discussed. Informed consent was obtained.   DESCRIPTION OF PROCEDURE: The patient was brought to the vascular/interventional radiology suite. The right neck and chest were sterilely prepped and draped and a sterile surgical field was created. Due to poor anatomic landmarks, ultrasound was used to visualize a patent right jugular vein. This was then accessed under direct ultrasound guidance without difficulty with a Seldinger needle and a J-wire was placed. Permanent image was recorded. A peel-away sheath was placed over the wire and the wire and dilator were removed. I then anesthetized an area approximately two fingerbreadths below the right clavicle. A transverse incision was created and dissected down creating an inferior pocket. The port was then secured with two Prolene sutures to the chest wall and the catheter was connected to the port and tunneled from the subclavicular incision to the access site. Fluoroscopic guidance was used to cut the catheter to an appropriate length. This was then placed through the peel-away sheath and the peel-away sheath was removed. The catheter tip was found to be in good location just into the right atrium without kink. The incision was copiously irrigated with antibiotic-impregnated  saline and then closed with a running 3-0 Vicryl and 4-0 Monocryl. A single 4-0 Monocryl was used to close the access incision. The Breese needle was then used to access the port. It withdrew blood well and flushed easily with heparinized saline. Dermabond was placed as a dressing. The patient tolerated the procedure well and was taken to the recovery room in stable condition.     ____________________________ Algernon Huxley, MD jsd:bjt D: 09/19/2011 13:39:32 ET T: 09/19/2011 14:02:38 ET JOB#: [id number]  cc: Algernon Huxley, MD, <Dictator> Kathlene November. Grayland Ormond, MD Bethena Roys. Ancil Boozer, MD Algernon Huxley MD ELECTRONICALLY SIGNED 09/20/2011 12:08

## 2015-01-08 ENCOUNTER — Encounter: Payer: Self-pay | Admitting: Family Medicine

## 2015-01-08 DIAGNOSIS — T451X5A Adverse effect of antineoplastic and immunosuppressive drugs, initial encounter: Secondary | ICD-10-CM | POA: Insufficient documentation

## 2015-01-08 DIAGNOSIS — G4700 Insomnia, unspecified: Secondary | ICD-10-CM | POA: Insufficient documentation

## 2015-01-08 DIAGNOSIS — G620 Drug-induced polyneuropathy: Secondary | ICD-10-CM | POA: Insufficient documentation

## 2015-01-08 DIAGNOSIS — E785 Hyperlipidemia, unspecified: Secondary | ICD-10-CM | POA: Insufficient documentation

## 2015-01-08 DIAGNOSIS — E8881 Metabolic syndrome: Secondary | ICD-10-CM | POA: Insufficient documentation

## 2015-01-08 DIAGNOSIS — G63 Polyneuropathy in diseases classified elsewhere: Secondary | ICD-10-CM

## 2015-01-08 DIAGNOSIS — M2550 Pain in unspecified joint: Secondary | ICD-10-CM | POA: Insufficient documentation

## 2015-01-08 DIAGNOSIS — I10 Essential (primary) hypertension: Secondary | ICD-10-CM | POA: Insufficient documentation

## 2015-01-08 DIAGNOSIS — J449 Chronic obstructive pulmonary disease, unspecified: Secondary | ICD-10-CM | POA: Insufficient documentation

## 2015-01-08 DIAGNOSIS — J302 Other seasonal allergic rhinitis: Secondary | ICD-10-CM | POA: Insufficient documentation

## 2015-01-08 DIAGNOSIS — Z6835 Body mass index (BMI) 35.0-35.9, adult: Secondary | ICD-10-CM

## 2015-01-11 ENCOUNTER — Encounter: Payer: Self-pay | Admitting: Family Medicine

## 2015-02-08 ENCOUNTER — Ambulatory Visit: Payer: Self-pay | Admitting: Family Medicine

## 2015-02-10 ENCOUNTER — Other Ambulatory Visit: Payer: Self-pay

## 2015-02-10 MED ORDER — ZOLPIDEM TARTRATE 10 MG PO TABS: 10.0000 mg | ORAL_TABLET | Freq: Every day | ORAL | Status: DC

## 2015-02-10 NOTE — Telephone Encounter (Signed)
Pt would like refill

## 2015-02-10 NOTE — Telephone Encounter (Signed)
Patient requesting refill. 

## 2015-02-12 MED ORDER — ZOLPIDEM TARTRATE 10 MG PO TABS: 10.0000 mg | ORAL_TABLET | Freq: Every day | ORAL | Status: DC

## 2015-03-14 ENCOUNTER — Encounter: Payer: Self-pay | Admitting: Family Medicine

## 2015-03-17 ENCOUNTER — Ambulatory Visit (INDEPENDENT_AMBULATORY_CARE_PROVIDER_SITE_OTHER): Payer: PRIVATE HEALTH INSURANCE | Admitting: Family Medicine

## 2015-03-17 ENCOUNTER — Encounter: Payer: Self-pay | Admitting: Family Medicine

## 2015-03-17 VITALS — BP 134/70 | HR 109 | Temp 98.6°F | Resp 20 | Ht 65.0 in | Wt 213.0 lb

## 2015-03-17 DIAGNOSIS — Z23 Encounter for immunization: Secondary | ICD-10-CM

## 2015-03-17 DIAGNOSIS — E668 Other obesity: Secondary | ICD-10-CM | POA: Diagnosis not present

## 2015-03-17 DIAGNOSIS — Z13220 Encounter for screening for lipoid disorders: Secondary | ICD-10-CM

## 2015-03-17 DIAGNOSIS — Z716 Tobacco abuse counseling: Secondary | ICD-10-CM

## 2015-03-17 DIAGNOSIS — I10 Essential (primary) hypertension: Secondary | ICD-10-CM

## 2015-03-17 DIAGNOSIS — E8881 Metabolic syndrome: Secondary | ICD-10-CM

## 2015-03-17 DIAGNOSIS — Z1211 Encounter for screening for malignant neoplasm of colon: Secondary | ICD-10-CM | POA: Diagnosis not present

## 2015-03-17 DIAGNOSIS — Z79899 Other long term (current) drug therapy: Secondary | ICD-10-CM

## 2015-03-17 DIAGNOSIS — C50912 Malignant neoplasm of unspecified site of left female breast: Secondary | ICD-10-CM | POA: Diagnosis not present

## 2015-03-17 DIAGNOSIS — IMO0002 Reserved for concepts with insufficient information to code with codable children: Secondary | ICD-10-CM

## 2015-03-17 DIAGNOSIS — Z0000 Encounter for general adult medical examination without abnormal findings: Secondary | ICD-10-CM

## 2015-03-17 DIAGNOSIS — Z114 Encounter for screening for human immunodeficiency virus [HIV]: Secondary | ICD-10-CM | POA: Diagnosis not present

## 2015-03-17 DIAGNOSIS — Z124 Encounter for screening for malignant neoplasm of cervix: Secondary | ICD-10-CM | POA: Diagnosis not present

## 2015-03-17 DIAGNOSIS — Z01419 Encounter for gynecological examination (general) (routine) without abnormal findings: Secondary | ICD-10-CM

## 2015-03-17 MED ORDER — TELMISARTAN-HCTZ 40-12.5 MG PO TABS: 1.0000 | ORAL_TABLET | Freq: Every day | ORAL | Status: DC

## 2015-03-17 MED ORDER — CYCLOBENZAPRINE HCL 10 MG PO TABS: 10.0000 mg | ORAL_TABLET | Freq: Every day | ORAL | Status: DC

## 2015-03-17 MED ORDER — ZOLPIDEM TARTRATE 10 MG PO TABS: 10.0000 mg | ORAL_TABLET | Freq: Every day | ORAL | Status: DC

## 2015-03-17 MED ORDER — ANASTROZOLE 1 MG PO TABS: 1.0000 mg | ORAL_TABLET | Freq: Every day | ORAL | Status: DC

## 2015-03-17 NOTE — Progress Notes (Signed)
Name: Brenda Jones   MRN: [id number]    DOB: 06-23-1966   Date:03/17/2015       Progress Note  Subjective  Chief Complaint  Chief Complaint  Patient presents with  . 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 Grayland Ormond yearly and he orders her mammogram. Due for colon cancer screening. She missed her last regular follow and needs some prescriptions filled.   Patient Active Problem List   Diagnosis Date Noted  . Breast carcinoma 01/08/2015  . Benign essential HTN 01/08/2015  . Insomnia, persistent 01/08/2015  . COPD, mild 01/08/2015  . HLD (hyperlipidemia) 01/08/2015  . Dysmetabolic syndrome 30/86/5784  . Adult BMI 30+ 01/08/2015  . Allergic rhinitis 01/08/2015  . Secondary peripheral neuropathy 01/08/2015  . Arthralgia of multiple joints 01/08/2015    Past Surgical History  Procedure Laterality Date  . Breast surgery    . Breast lumpectomy  2013  . Bladder surgery  2007    Family History  Problem Relation Age of Onset  . Heart disease Mother   . Diabetes Father   . Hypertension Father     Social History   Social History  . Marital Status: Single    Spouse Name: N/A  . Number of Children: N/A  . Years of Education: N/A   Occupational History  . Not on file.   Social History Main Topics  . Smoking status: Current Every Day Smoker -- 0.25 packs/day for 35 years    Types: Cigarettes  . Smokeless tobacco: Not on file  . Alcohol Use: No  . Drug Use: No  . Sexual Activity: No   Other Topics Concern  . Not on file   Social History Narrative     Current outpatient prescriptions:  .  anastrozole (ARIMIDEX) 1 MG tablet, Take 1 tablet (1 mg total) by mouth daily., Disp: 30 tablet, Rfl: 0 .  aspirin 81 MG tablet, Take 1 tablet by mouth daily., Disp: , Rfl:  .  cyclobenzaprine (FLEXERIL) 10 MG tablet,  Take 1 tablet (10 mg total) by mouth at bedtime., Disp: 30 tablet, Rfl: 0 .  fluticasone (FLONASE) 50 MCG/ACT nasal spray, Place 2 sprays into the nose as needed., Disp: , Rfl:  .  Hydrocodone-Acetaminophen (VICODIN) 5-300 MG TABS, Take 1 tablet by mouth as needed., Disp: , Rfl:  .  Ipratropium-Albuterol (COMBIVENT RESPIMAT) 20-100 MCG/ACT AERS respimat, Inhale 1 puff into the lungs as needed., Disp: , Rfl:  .  loratadine (CLARITIN) 10 MG tablet, Take 1 tablet by mouth as needed., Disp: , Rfl:  .  nortriptyline (PAMELOR) 25 MG capsule, Take 1 capsule by mouth daily., Disp: , Rfl:  .  telmisartan-hydrochlorothiazide (MICARDIS HCT) 40-12.5 MG per tablet, Take 1 tablet by mouth daily., Disp: 30 tablet, Rfl: 0 .  tiotropium (SPIRIVA HANDIHALER) 18 MCG inhalation capsule, Place 1 puff into inhaler and inhale as needed., Disp: , Rfl:  .  zolpidem (AMBIEN) 10 MG tablet, Take 1 tablet (10 mg total) by mouth at bedtime., Disp: 30 tablet, Rfl: 0  Allergies  Allergen Reactions  . Ace Inhibitors Cough     ROS  Constitutional: Negative for fever or weight change.  Respiratory: Negative for cough and shortness of breath.   Cardiovascular: Negative for chest pain or palpitations.  Gastrointestinal: Negative for abdominal pain, no bowel changes.  Musculoskeletal:  Negative for gait problem or joint swelling.  Skin: Negative for rash.  Neurological: Negative for dizziness or headache. 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  Objective  Filed Vitals:   03/17/15 0833  BP: 134/70  Pulse: 109  Temp: 98.6 F (37 C)  TempSrc: Oral  Resp: 20  Height: 5\' 5"  (1.651 m)  Weight: 213 lb (96.616 kg)  SpO2: 97%    Body mass index is 35.44 kg/(m^2).  Physical Exam  Constitutional: Patient appears well-developed and well-nourished. No distress.   HENT: Head: Normocephalic and atraumatic. Ears: B TMs ok, no erythema or effusion; Nose: Nose normal. Mouth/Throat: Oropharynx is clear and moist. No oropharyngeal exudate.  Eyes: Conjunctivae and EOM are normal. Pupils are equal, round, and reactive to light. No scleral icterus.  Neck: Normal range of motion. Neck supple. No JVD present. No thyromegaly present.  Cardiovascular: Normal rate, regular rhythm and normal heart sounds.  No murmur heard. No BLE edema. Pulmonary/Chest: Effort normal and breath sounds normal. No respiratory distress. Abdominal: Soft. Bowel sounds are normal, no distension. There is no tenderness. no masses Breast: no lumps or masses, no nipple discharge or rashes FEMALE GENITALIA:  External genitalia normal External urethra normal Vaginal vault normal without discharge or lesions Cervix normal without discharge or lesions Bimanual exam normal without masses RECTAL: not done Musculoskeletal: Normal range of motion, no joint effusions. No gross deformities Neurological: he is alert and oriented to person, place, and time. No cranial nerve deficit. Coordination, balance, strength, speech and gait are normal.  Skin: Skin is warm and dry. No rash noted. No erythema.  Psychiatric: Patient has a normal mood and affect. behavior is normal. Judgment and thought content normal.   PHQ2/9: Depression screen PHQ 2/9 03/17/2015  Decreased Interest 0  Down, Depressed, Hopeless 0  PHQ - 2 Score 0    Fall Risk: Fall Risk  03/17/2015  Falls in the past year? No     Assessment & Plan  1. Well woman exam Discussed importance of 150 minutes of physical activity weekly, eat two servings of fish weekly, eat one serving of tree nuts ( cashews, pistachios, pecans, almonds.Marland Kitchen) every other day, eat 6 servings of fruit/vegetables daily and drink plenty of water and avoid sweet beverages.   2. Benign essential HTN  - Comprehensive metabolic panel  3. Dysmetabolic syndrome  -  Hemoglobin A1c  4. Lipid screening  - Lipid panel  5. Long-term use of high-risk medication  - CBC with Differential/Platelet  6. Colon cancer screening  - Ambulatory referral to Gastroenterology  7. Carcinoma of left breast Gets mammogram ordered by oncologist  8. Cervical cancer screening  - Pap IG, CT/NG NAA, and HPV (high risk)  9. Screening for HIV (human immunodeficiency virus)  - HIV antibody  10. Needs flu shot  - Flu Vaccine QUAD 36+ mos IM - not given - patient refused   11. Encounter for tobacco use cessation counseling She is down to 2-3 cigarettes daily, used to smoke up to 2 packs daily for many years, but is cutting down since diagnosed with breast cancer in 02/2011. Explained to her that she is not addictited to nicotine, but she may want to transition to nicotine gum and see if she is able to completely stop smoking  12. Adult BMI 30+ Discussed with the patient the risk  posed by an increased BMI. Discussed importance of portion control, calorie counting and at least 150 minutes of physical activity weekly. Avoid sweet beverages and drink more water. Eat at least 6 servings of fruit and vegetables daily

## 2015-03-22 ENCOUNTER — Telehealth: Payer: Self-pay | Admitting: Gastroenterology

## 2015-03-22 ENCOUNTER — Encounter: Payer: Self-pay | Admitting: Family Medicine

## 2015-03-22 ENCOUNTER — Other Ambulatory Visit: Payer: Self-pay

## 2015-03-22 DIAGNOSIS — R87810 Cervical high risk human papillomavirus (HPV) DNA test positive: Secondary | ICD-10-CM | POA: Insufficient documentation

## 2015-03-22 LAB — PAP IG, CT-NG NAA, HPV HIGH-RISK
HPV, HIGH-RISK: POSITIVE — AB
PAP Smear Comment: 0

## 2015-03-22 NOTE — Telephone Encounter (Signed)
Gastroenterology Pre-Procedure Review  Request Date:04-04-2015 Requesting Physician: Dr. Ancil Boozer  PATIENT REVIEW QUESTIONS: The patient responded to the following health history questions as indicated:    1. Are you having any GI issues? no 2. Do you have a personal history of Polyps? no 3. Do you have a family history of Colon Cancer or Polyps? no 4. Diabetes Mellitus? no 5. Joint replacements in the past 12 months?no 6. Major health problems in the past 3 months?no 7. Any artificial heart valves, MVP, or defibrillator?no    MEDICATIONS & ALLERGIES:    Patient reports the following regarding taking any anticoagulation/antiplatelet therapy:   Plavix, Coumadin, Eliquis, Xarelto, Lovenox, Pradaxa, Brilinta, or Effient? no Aspirin? yes (Daily)  Patient confirms/reports the following medications:  Current Outpatient Prescriptions  Medication Sig Dispense Refill   anastrozole (ARIMIDEX) 1 MG tablet Take 1 tablet (1 mg total) by mouth daily. 30 tablet 0   aspirin 81 MG tablet Take 1 tablet by mouth daily.     cyclobenzaprine (FLEXERIL) 10 MG tablet Take 1 tablet (10 mg total) by mouth at bedtime. 30 tablet 0   fluticasone (FLONASE) 50 MCG/ACT nasal spray Place 2 sprays into the nose as needed.     Hydrocodone-Acetaminophen (VICODIN) 5-300 MG TABS Take 1 tablet by mouth as needed.     Ipratropium-Albuterol (COMBIVENT RESPIMAT) 20-100 MCG/ACT AERS respimat Inhale 1 puff into the lungs as needed.     loratadine (CLARITIN) 10 MG tablet Take 1 tablet by mouth as needed.     nortriptyline (PAMELOR) 25 MG capsule Take 1 capsule by mouth daily.     telmisartan-hydrochlorothiazide (MICARDIS HCT) 40-12.5 MG per tablet Take 1 tablet by mouth daily. 30 tablet 0   tiotropium (SPIRIVA HANDIHALER) 18 MCG inhalation capsule Place 1 puff into inhaler and inhale as needed.     zolpidem (AMBIEN) 10 MG tablet Take 1 tablet (10 mg total) by mouth at bedtime. 30 tablet 0   No current  facility-administered medications for this visit.    Patient confirms/reports the following allergies:  Allergies  Allergen Reactions   Ace Inhibitors Cough    No orders of the defined types were placed in this encounter.    AUTHORIZATION INFORMATION Primary Insurance: 1D#: Group #:  Secondary Insurance: 1D#: Group #:  SCHEDULE INFORMATION: Date: 04-04-2015 Time: Location:[facility]

## 2015-03-23 NOTE — Progress Notes (Signed)
Pt.notified

## 2015-03-29 ENCOUNTER — Encounter: Payer: Self-pay | Admitting: *Deleted

## 2015-03-31 NOTE — Discharge Instructions (Signed)

## 2015-04-04 ENCOUNTER — Other Ambulatory Visit: Payer: Self-pay | Admitting: Gastroenterology

## 2015-04-04 ENCOUNTER — Encounter: Payer: Self-pay | Admitting: Anesthesiology

## 2015-04-04 ENCOUNTER — Ambulatory Visit: Payer: No Typology Code available for payment source | Admitting: Anesthesiology

## 2015-04-04 ENCOUNTER — Encounter
Admission: RE | Disposition: A | Discharge status: Home / Self Care | Payer: Self-pay | Source: Ambulatory Visit | Attending: Gastroenterology

## 2015-04-04 ENCOUNTER — Ambulatory Visit
Admission: RE | Admit: 2015-04-04 | Discharge: 2015-04-04 | Disposition: A | Discharge status: Home / Self Care | Payer: No Typology Code available for payment source | Source: Ambulatory Visit | Attending: Gastroenterology | Admitting: Gastroenterology

## 2015-04-04 DIAGNOSIS — Z1211 Encounter for screening for malignant neoplasm of colon: Secondary | ICD-10-CM | POA: Insufficient documentation

## 2015-04-04 DIAGNOSIS — E669 Obesity, unspecified: Secondary | ICD-10-CM | POA: Diagnosis not present

## 2015-04-04 DIAGNOSIS — G629 Polyneuropathy, unspecified: Secondary | ICD-10-CM | POA: Diagnosis not present

## 2015-04-04 DIAGNOSIS — Z888 Allergy status to other drugs, medicaments and biological substances status: Secondary | ICD-10-CM | POA: Insufficient documentation

## 2015-04-04 DIAGNOSIS — J449 Chronic obstructive pulmonary disease, unspecified: Secondary | ICD-10-CM | POA: Diagnosis not present

## 2015-04-04 DIAGNOSIS — Z7982 Long term (current) use of aspirin: Secondary | ICD-10-CM | POA: Insufficient documentation

## 2015-04-04 DIAGNOSIS — Z833 Family history of diabetes mellitus: Secondary | ICD-10-CM | POA: Insufficient documentation

## 2015-04-04 DIAGNOSIS — G4700 Insomnia, unspecified: Secondary | ICD-10-CM | POA: Insufficient documentation

## 2015-04-04 DIAGNOSIS — I10 Essential (primary) hypertension: Secondary | ICD-10-CM | POA: Diagnosis not present

## 2015-04-04 DIAGNOSIS — F17210 Nicotine dependence, cigarettes, uncomplicated: Secondary | ICD-10-CM | POA: Diagnosis not present

## 2015-04-04 DIAGNOSIS — Z8249 Family history of ischemic heart disease and other diseases of the circulatory system: Secondary | ICD-10-CM | POA: Insufficient documentation

## 2015-04-04 DIAGNOSIS — K621 Rectal polyp: Secondary | ICD-10-CM | POA: Diagnosis not present

## 2015-04-04 DIAGNOSIS — M1990 Unspecified osteoarthritis, unspecified site: Secondary | ICD-10-CM | POA: Diagnosis not present

## 2015-04-04 DIAGNOSIS — Z853 Personal history of malignant neoplasm of breast: Secondary | ICD-10-CM | POA: Diagnosis not present

## 2015-04-04 DIAGNOSIS — R51 Headache: Secondary | ICD-10-CM | POA: Insufficient documentation

## 2015-04-04 DIAGNOSIS — E8881 Metabolic syndrome: Secondary | ICD-10-CM | POA: Diagnosis not present

## 2015-04-04 DIAGNOSIS — K5730 Diverticulosis of large intestine without perforation or abscess without bleeding: Secondary | ICD-10-CM | POA: Diagnosis not present

## 2015-04-04 DIAGNOSIS — D124 Benign neoplasm of descending colon: Secondary | ICD-10-CM | POA: Diagnosis not present

## 2015-04-04 DIAGNOSIS — Z6833 Body mass index (BMI) 33.0-33.9, adult: Secondary | ICD-10-CM | POA: Insufficient documentation

## 2015-04-04 DIAGNOSIS — Z79899 Other long term (current) drug therapy: Secondary | ICD-10-CM | POA: Diagnosis not present

## 2015-04-04 HISTORY — DX: Headache, unspecified: R51.9

## 2015-04-04 HISTORY — DX: Headache: R51

## 2015-04-04 HISTORY — DX: Malignant neoplasm of unspecified site of unspecified female breast: C50.919

## 2015-04-04 HISTORY — PX: COLONOSCOPY WITH PROPOFOL: SHX5780

## 2015-04-04 HISTORY — PX: POLYPECTOMY: SHX5525

## 2015-04-04 SURGERY — COLONOSCOPY WITH PROPOFOL
Anesthesia: Monitor Anesthesia Care | Wound class: Contaminated

## 2015-04-04 MED ORDER — PROPOFOL 10 MG/ML IV BOLUS
INTRAVENOUS | Status: DC | PRN
  Administered 2015-04-04: 20 mg via INTRAVENOUS
  Administered 2015-04-04 (×4): 50 mg via INTRAVENOUS
  Administered 2015-04-04: 20 mg via INTRAVENOUS
  Administered 2015-04-04: 50 mg via INTRAVENOUS

## 2015-04-04 MED ORDER — LIDOCAINE HCL (CARDIAC) 20 MG/ML IV SOLN
INTRAVENOUS | Status: DC | PRN
  Administered 2015-04-04: 40 mg via INTRAVENOUS

## 2015-04-04 MED ORDER — STERILE WATER FOR IRRIGATION IR SOLN
Status: DC | PRN
  Administered 2015-04-04: 08:00:00

## 2015-04-04 MED ORDER — LACTATED RINGERS IV SOLN
INTRAVENOUS | Status: DC
  Administered 2015-04-04: 08:00:00 via INTRAVENOUS

## 2015-04-04 SURGICAL SUPPLY — 28 items

## 2015-04-04 NOTE — Op Note (Signed)
[facility] Patient Name: Brenda Jones Procedure Date: 04/04/2015 7:53 AM MRN: [id number] Account #: [id number] Date of Birth: [birth year] Admit Type: Outpatient Age: 49 Room: [facility] OR ROOM 01 Gender: Female Note Status: Finalized Procedure:         Colonoscopy Indications:       Screening for colorectal malignant neoplasm Providers:         [name], MD Referring MD:      Bethena Roys. Sowles, MD (Referring MD) Medicines:         Propofol per Anesthesia Complications:     No immediate complications. Procedure:         Pre-Anesthesia Assessment:                    - Prior to the procedure, a History and Physical was                     performed, and patient medications and allergies were                     reviewed. The patient's tolerance of previous anesthesia                     was also reviewed. The risks and benefits of the procedure                     and the sedation options and risks were discussed with the                     patient. All questions were answered, and informed consent                     was obtained. Prior Anticoagulants: The patient has taken                     no previous anticoagulant or antiplatelet agents. ASA                     Grade Assessment: II - A patient with mild systemic                     disease. After reviewing the risks and benefits, the                     patient was deemed in satisfactory condition to undergo                     the procedure.                    After obtaining informed consent, the colonoscope was                     passed under direct vision. Throughout the procedure, the                     patient's blood pressure, pulse, and oxygen saturations                     were monitored continuously. The Olympus CF H180AL                     colonoscope (S#: [id number]) was introduced through the anus  and advanced to the the cecum, identified by appendiceal                 orifice and ileocecal valve. The colonoscopy was performed                     without difficulty. The patient tolerated the procedure                     well. The quality of the bowel preparation was excellent. Findings:      The perianal and digital rectal examinations were normal.      A few small-mouthed diverticula were found in the entire colon.      Two sessile polyps were found in the descending colon. The polyps were 2       to 3 mm in size. These polyps were removed with a cold biopsy forceps.       Resection and retrieval were complete.      A 6 mm polyp was found in the descending colon. The polyp was       pedunculated. The polyp was removed with a cold snare. Resection and       retrieval were complete.      Three sessile polyps were found in the rectum. The polyps were 3 to 4 mm       in size. These polyps were removed with a cold biopsy forceps. Resection       and retrieval were complete. Impression:        - Diverticulosis in the entire examined colon.                    - Two 2 to 3 mm polyps in the descending colon. Resected                     and retrieved.                    - One 6 mm polyp in the descending colon. Resected and                     retrieved.                    - Three 3 to 4 mm polyps in the rectum. Resected and                     retrieved. Recommendation:    - Await pathology results.                    - Repeat colonoscopy in 5 years if polyp adenoma and 10                     years if hyperplastic Procedure Code(s): --- Professional ---                    [phone], Colonoscopy, flexible; with removal of tumor(s),                     polyp(s), or other lesion(s) by snare technique                    45380, 59, Colonoscopy, flexible; with biopsy, single or                     multiple Diagnosis Code(s): ---  Professional ---                    Z12.11, Encounter for screening for malignant neoplasm of                     colon                     D12.4, Benign neoplasm of descending colon                    K62.1, Rectal polyp CPT copyright 2014 [employer]. All rights reserved. The codes documented in this report are preliminary and upon coder review may  be revised to meet current compliance requirements. [name], MD 04/04/2015 8:15:39 AM This report has been signed electronically. Number of Addenda: 0 Note Initiated On: 04/04/2015 7:53 AM Scope Withdrawal Time: 0 hours 9 minutes 23 seconds  Total Procedure Duration: 0 hours 10 minutes 47 seconds       [facility]

## 2015-04-04 NOTE — Anesthesia Preprocedure Evaluation (Signed)
Anesthesia Evaluation  Patient identified by MRN, date of birth, ID band Patient awake    Reviewed: Allergy & Precautions, H&P , NPO status , Patient's Chart, lab work & pertinent test results, reviewed documented beta blocker date and time   Airway Mallampati: II  TM Distance: >3 FB Neck ROM: full    Dental no notable dental hx.    Pulmonary COPD, Current Smoker,    Pulmonary exam normal breath sounds clear to auscultation       Cardiovascular Exercise Tolerance: Good hypertension,  Rhythm:regular Rate:Normal     Neuro/Psych  Headaches, negative psych ROS   GI/Hepatic negative GI ROS, Neg liver ROS,   Endo/Other  negative endocrine ROS  Renal/GU negative Renal ROS  negative genitourinary   Musculoskeletal   Abdominal   Peds  Hematology negative hematology ROS (+)   Anesthesia Other Findings   Reproductive/Obstetrics negative OB ROS                             Anesthesia Physical Anesthesia Plan  ASA: II  Anesthesia Plan: MAC   Post-op Pain Management:    Induction:   Airway Management Planned:   Additional Equipment:   Intra-op Plan:   Post-operative Plan:   Informed Consent: I have reviewed the patients History and Physical, chart, labs and discussed the procedure including the risks, benefits and alternatives for the proposed anesthesia with the patient or authorized representative who has indicated his/her understanding and acceptance.     Plan Discussed with: CRNA  Anesthesia Plan Comments:         Anesthesia Quick Evaluation

## 2015-04-04 NOTE — Anesthesia Procedure Notes (Signed)
Procedure Name: MAC Performed by: Mahoganie Basher Pre-anesthesia Checklist: Patient identified, Emergency Drugs available, Suction available, Timeout performed and Patient being monitored Patient Re-evaluated:Patient Re-evaluated prior to inductionOxygen Delivery Method: Nasal cannula Placement Confirmation: positive ETCO2     

## 2015-04-04 NOTE — Anesthesia Postprocedure Evaluation (Signed)
  Anesthesia Post-op Note  Patient: Brenda Jones  Procedure(s) Performed: Procedure(s): COLONOSCOPY WITH PROPOFOL (N/A) POLYPECTOMY  Anesthesia type:MAC  Patient location: PACU  Post pain: Pain level controlled  Post assessment: Post-op Vital signs reviewed, Patient's Cardiovascular Status Stable, Respiratory Function Stable, Patent Airway and No signs of Nausea or vomiting  Post vital signs: Reviewed and stable  Last Vitals:  Filed Vitals:   04/04/15 0826  BP: 102/63  Pulse: 104  Temp:   Resp: 31    Level of consciousness: awake, alert  and patient cooperative  Complications: No apparent anesthesia complications

## 2015-04-04 NOTE — Transfer of Care (Signed)
Immediate Anesthesia Transfer of Care Note  Patient: Brenda Jones  Procedure(s) Performed: Procedure(s): COLONOSCOPY WITH PROPOFOL (N/A) POLYPECTOMY  Patient Location: PACU  Anesthesia Type: MAC  Level of Consciousness: awake, alert  and patient cooperative  Airway and Oxygen Therapy: Patient Spontanous Breathing and Patient connected to supplemental oxygen  Post-op Assessment: Post-op Vital signs reviewed, Patient's Cardiovascular Status Stable, Respiratory Function Stable, Patent Airway and No signs of Nausea or vomiting  Post-op Vital Signs: Reviewed and stable  Complications: No apparent anesthesia complications

## 2015-04-04 NOTE — H&P (Signed)
[facility]  [address] [place], [place] [address] Phone: [phone] Fax : [phone]  Primary Care Physician:  Loistine Chance, MD Primary Gastroenterologist:  Dr. Allen Norris  Pre-Procedure History & Physical: HPI:  Brenda Jones is a 49 y.o. female is here for a screening colonoscopy.   Past Medical History  Diagnosis Date  . Allergy   . COPD (chronic obstructive pulmonary disease)   . Hypertension   . Metabolic syndrome   . Insomnia   . Arthritis   . Obesity   . Peripheral neuropathy   . Breast CA   . Headache     occasional - thinks it is from BP meds    Past Surgical History  Procedure Laterality Date  . Bladder surgery  2007  . Breast surgery    . Breast lumpectomy  2013    Prior to Admission medications   Medication Sig Start Date End Date Taking? Authorizing Provider  anastrozole (ARIMIDEX) 1 MG tablet Take 1 tablet (1 mg total) by mouth daily. 03/17/15  Yes Steele Sizer, MD  aspirin 81 MG tablet Take 1 tablet by mouth daily. 05/05/08  Yes Historical Provider, MD  nortriptyline (PAMELOR) 25 MG capsule Take 1 capsule by mouth daily. 11/08/14  Yes Historical Provider, MD  ranitidine (ZANTAC) 75 MG tablet Take 75 mg by mouth as needed for heartburn.   Yes Historical Provider, MD  telmisartan-hydrochlorothiazide (MICARDIS HCT) 40-12.5 MG per tablet Take 1 tablet by mouth daily. 03/17/15  Yes Steele Sizer, MD  zolpidem (AMBIEN) 10 MG tablet Take 1 tablet (10 mg total) by mouth at bedtime. 03/17/15  Yes Steele Sizer, MD  cyclobenzaprine (FLEXERIL) 10 MG tablet Take 1 tablet (10 mg total) by mouth at bedtime. 03/17/15   Steele Sizer, MD  fluticasone (FLONASE) 50 MCG/ACT nasal spray Place 2 sprays into the nose as needed. 10/06/13   Historical Provider, MD  Hydrocodone-Acetaminophen (VICODIN) 5-300 MG TABS Take 1 tablet by mouth as needed. 11/08/14   Historical Provider, MD  Ipratropium-Albuterol (COMBIVENT RESPIMAT) 20-100 MCG/ACT AERS respimat  Inhale 1 puff into the lungs as needed. 11/08/14   Historical Provider, MD  loratadine (CLARITIN) 10 MG tablet Take 1 tablet by mouth as needed. 10/06/13   Historical Provider, MD  tiotropium (SPIRIVA HANDIHALER) 18 MCG inhalation capsule Place 1 puff into inhaler and inhale as needed. 10/06/13   Historical Provider, MD    Allergies as of 03/22/2015 - Review Complete 03/17/2015  Allergen Reaction Noted  . Ace inhibitors Cough 01/07/2015    Family History  Problem Relation Age of Onset  . Heart disease Mother   . Diabetes Father   . Hypertension Father     Social History   Social History  . Marital Status: Single    Spouse Name: N/A  . Number of Children: N/A  . Years of Education: N/A   Occupational History  . Not on file.   Social History Main Topics  . Smoking status: Current Every Day Smoker -- 0.25 packs/day for 35 years    Types: Cigarettes  . Smokeless tobacco: Not on file  . Alcohol Use: No  . Drug Use: No  . Sexual Activity: No   Other Topics Concern  . Not on file   Social History Narrative    Review of Systems: See HPI, otherwise negative ROS  Physical Exam: BP 110/72 mmHg  Pulse 75  Temp(Src) 97.5 F (36.4 C)  Resp 16  Ht 5\' 5"  (1.651 m)  Wt 204 lb (92.534  kg)  BMI 33.95 kg/m2  SpO2 98% General:   Alert,  pleasant and cooperative in NAD Head:  Normocephalic and atraumatic. Neck:  Supple; no masses or thyromegaly. Lungs:  Clear throughout to auscultation.    Heart:  Regular rate and rhythm. Abdomen:  Soft, nontender and nondistended. Normal bowel sounds, without guarding, and without rebound.   Neurologic:  Alert and  oriented x4;  grossly normal neurologically.  Impression/Plan: [name] is now here to undergo a screening colonoscopy.  Risks, benefits, and alternatives regarding colonoscopy have been reviewed with the patient.  Questions have been answered.  All parties agreeable.

## 2015-04-05 ENCOUNTER — Encounter: Payer: Self-pay | Admitting: Gastroenterology

## 2015-04-07 ENCOUNTER — Encounter: Payer: Self-pay | Admitting: Gastroenterology

## 2015-04-08 ENCOUNTER — Other Ambulatory Visit: Payer: Self-pay | Admitting: *Deleted

## 2015-04-08 DIAGNOSIS — C50912 Malignant neoplasm of unspecified site of left female breast: Secondary | ICD-10-CM

## 2015-04-11 ENCOUNTER — Inpatient Hospital Stay: Payer: No Typology Code available for payment source | Attending: Oncology

## 2015-04-11 ENCOUNTER — Inpatient Hospital Stay (HOSPITAL_BASED_OUTPATIENT_CLINIC_OR_DEPARTMENT_OTHER): Payer: No Typology Code available for payment source | Admitting: Oncology

## 2015-04-11 VITALS — BP 138/83 | HR 84 | Temp 96.0°F | Resp 16 | Wt 209.4 lb

## 2015-04-11 DIAGNOSIS — M8580 Other specified disorders of bone density and structure, unspecified site: Secondary | ICD-10-CM

## 2015-04-11 DIAGNOSIS — Z7982 Long term (current) use of aspirin: Secondary | ICD-10-CM | POA: Insufficient documentation

## 2015-04-11 DIAGNOSIS — F17210 Nicotine dependence, cigarettes, uncomplicated: Secondary | ICD-10-CM

## 2015-04-11 DIAGNOSIS — C50912 Malignant neoplasm of unspecified site of left female breast: Secondary | ICD-10-CM

## 2015-04-11 DIAGNOSIS — Z170 Estrogen receptor positive status [ER+]: Secondary | ICD-10-CM | POA: Diagnosis not present

## 2015-04-11 DIAGNOSIS — G4700 Insomnia, unspecified: Secondary | ICD-10-CM | POA: Diagnosis not present

## 2015-04-11 DIAGNOSIS — M129 Arthropathy, unspecified: Secondary | ICD-10-CM | POA: Insufficient documentation

## 2015-04-11 DIAGNOSIS — J449 Chronic obstructive pulmonary disease, unspecified: Secondary | ICD-10-CM | POA: Diagnosis not present

## 2015-04-11 DIAGNOSIS — Z79811 Long term (current) use of aromatase inhibitors: Secondary | ICD-10-CM | POA: Diagnosis not present

## 2015-04-11 DIAGNOSIS — G629 Polyneuropathy, unspecified: Secondary | ICD-10-CM

## 2015-04-11 DIAGNOSIS — Z79899 Other long term (current) drug therapy: Secondary | ICD-10-CM | POA: Insufficient documentation

## 2015-04-11 DIAGNOSIS — E669 Obesity, unspecified: Secondary | ICD-10-CM | POA: Diagnosis not present

## 2015-04-11 DIAGNOSIS — I10 Essential (primary) hypertension: Secondary | ICD-10-CM | POA: Insufficient documentation

## 2015-04-11 NOTE — Progress Notes (Signed)
Patient's last mammogram and Bone density test was on 11/05/14.

## 2015-04-12 LAB — CANCER ANTIGEN 27.29: CA 27.29: 5.2 U/mL (ref 0.0–38.6)

## 2015-04-17 NOTE — Progress Notes (Signed)
[facility]  [phone] [phone]  ID: Brenda Jones OB: 01/12/1966  MR#: [id number]  CSN#:[id number]  Patient Care Team: Krichna Sowles, MD as PCP - General (Family Medicine)  CHIEF COMPLAINT:  Chief Complaint  Patient presents with  . Breast Cancer    INTERVAL HISTORY: Patient returns to clinic today for routine 6 month evaluation.  She continues to have peripheral neuropathy that is essentially unchanged. She is tolerating letrozole well without significant side effects.  She has had no recent fevers or illnesses.  She denies any pain.  She has a good appetite and denies weight loss.  She has no chest pain or shortness of breath.  She denies any nausea, vomiting, constipation, or diarrhea.  She has no urinary complaints.  Patient offers no further specific complaints today.  REVIEW OF SYSTEMS:   Review of Systems  Constitutional: Negative.   Respiratory: Negative.   Cardiovascular: Negative.   Neurological: Positive for sensory change.    As per HPI. Otherwise, a complete review of systems is negatve.  PAST MEDICAL HISTORY: Past Medical History  Diagnosis Date  . Allergy   . COPD (chronic obstructive pulmonary disease)   . Hypertension   . Metabolic syndrome   . Insomnia   . Arthritis   . Obesity   . Peripheral neuropathy   . Breast CA   . Headache     occasional - thinks it is from BP meds    PAST SURGICAL HISTORY: Past Surgical History  Procedure Laterality Date  . Bladder surgery  2007  . Breast surgery    . Breast lumpectomy  2013  . Colonoscopy with propofol N/A 04/04/2015    Procedure: COLONOSCOPY WITH PROPOFOL;  Surgeon: Darren Wohl, MD;  Location: [facility];  Service: Endoscopy;  Laterality: N/A;  . Polypectomy  04/04/2015    Procedure: POLYPECTOMY;  Surgeon: Darren Wohl, MD;  Location: [facility];  Service: Endoscopy;;    FAMILY HISTORY Family History  Problem Relation Age of Onset  .  Heart disease Mother   . Diabetes Father   . Hypertension Father        ADVANCED DIRECTIVES:    HEALTH MAINTENANCE: Social History  Substance Use Topics  . Smoking status: Current Every Day Smoker -- 0.25 packs/day for 35 years    Types: Cigarettes  . Smokeless tobacco: Not on file  . Alcohol Use: No     Colonoscopy:  PAP:  Bone density:  Lipid panel:  Allergies  Allergen Reactions  . Ace Inhibitors Cough    Current Outpatient Prescriptions  Medication Sig Dispense Refill  . anastrozole (ARIMIDEX) 1 MG tablet Take 1 tablet (1 mg total) by mouth daily. 30 tablet 0  . aspirin 81 MG tablet Take 1 tablet by mouth daily.    . cyclobenzaprine (FLEXERIL) 10 MG tablet Take 1 tablet (10 mg total) by mouth at bedtime. 30 tablet 0  . fluticasone (FLONASE) 50 MCG/ACT nasal spray Place 2 sprays into the nose as needed.    . Hydrocodone-Acetaminophen (VICODIN) 5-300 MG TABS Take 1 tablet by mouth as needed.    . Ipratropium-Albuterol (COMBIVENT RESPIMAT) 20-100 MCG/ACT AERS respimat Inhale 1 puff into the lungs as needed.    . loratadine (CLARITIN) 10 MG tablet Take 1 tablet by mouth as needed.    . nortriptyline (PAMELOR) 25 MG capsule Take 1 capsule by mouth daily.    . ranitidine (ZANTAC) 75 MG tablet Take 75 mg by mouth as needed for heartburn.    .   telmisartan-hydrochlorothiazide (MICARDIS HCT) 40-12.5 MG per tablet Take 1 tablet by mouth daily. 30 tablet 0  . tiotropium (SPIRIVA HANDIHALER) 18 MCG inhalation capsule Place 1 puff into inhaler and inhale as needed.    . zolpidem (AMBIEN) 10 MG tablet Take 1 tablet (10 mg total) by mouth at bedtime. 30 tablet 0   No current facility-administered medications for this visit.    OBJECTIVE: Filed Vitals:   04/11/15 1048  BP: 138/83  Pulse: 84  Temp: 96 F (35.6 C)  Resp: 16     Body mass index is 34.85 kg/(m^2).    ECOG FS:0 - Asymptomatic  General: Well-developed, well-nourished, no acute distress. Eyes: Pink conjunctiva,  anicteric sclera. Breasts: Bilateral breast and axilla without lumps or masses. Lungs: Clear to auscultation bilaterally. Heart: Regular rate and rhythm. No rubs, murmurs, or gallops. Abdomen: Soft, nontender, nondistended. No organomegaly noted, normoactive bowel sounds. Musculoskeletal: No edema, cyanosis, or clubbing. Neuro: Alert, answering all questions appropriately. Cranial nerves grossly intact. Skin: No rashes or petechiae noted. Psych: Normal affect.   LAB RESULTS:  Lab Results  Component Value Date   NA 139 01/25/2012   K 3.6 01/25/2012   CL 105 01/25/2012   CO2 25 01/25/2012   GLUCOSE 148* 01/25/2012   BUN 10 01/25/2012   CREATININE 0.87 01/25/2012   CALCIUM 8.7 01/25/2012   PROT 6.4 01/25/2012   ALBUMIN 3.2* 01/25/2012   AST 20 01/25/2012   ALT 37 01/25/2012   ALKPHOS 91 01/25/2012   BILITOT 0.2 01/25/2012   GFRNONAA >60 01/25/2012   GFRAA >60 01/25/2012    Lab Results  Component Value Date   WBC 6.9 04/09/2012   NEUTROABS 4.7 04/09/2012   HGB 12.6 04/09/2012   HCT 39.2 04/09/2012   MCV 85 04/09/2012   PLT 248 04/09/2012     STUDIES: No results found.  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   PLAN:    1.  Breast cancer: No evidence of disease.  Continue anastrozole completing in September of 2018.  Her most recent mammogram on October 26, 2014 was reported as BI-RADS 2. Return to clinic in April 2017 with repeat mammogram and further evaluation. 2. Osteopenia: Bone mineral density in September 2014 was reported -1.0, repeat bone mineral density on October 26, 2014 was reported -2.0. Repeat in one year in April 2017.  Continue calcium and vitamin D supplementation.   3. Peripheral neuropathy: Stable. Previously, patient did not complete the recommended treatment of Taxol secondary to worsening peripheral neuropathy.   Patient expressed understanding and was in agreement with this plan. She also understands that She  can call clinic at any time with any questions, concerns, or complaints.    Timothy J Finnegan, MD   04/17/2015 12:12 PM      

## 2015-04-18 ENCOUNTER — Ambulatory Visit (INDEPENDENT_AMBULATORY_CARE_PROVIDER_SITE_OTHER): Payer: PRIVATE HEALTH INSURANCE | Admitting: Family Medicine

## 2015-04-18 ENCOUNTER — Encounter: Payer: Self-pay | Admitting: Family Medicine

## 2015-04-18 VITALS — BP 128/68 | HR 96 | Temp 98.2°F | Resp 18 | Ht 65.0 in | Wt 206.9 lb

## 2015-04-18 DIAGNOSIS — M899 Disorder of bone, unspecified: Secondary | ICD-10-CM | POA: Diagnosis not present

## 2015-04-18 DIAGNOSIS — E8881 Metabolic syndrome: Secondary | ICD-10-CM

## 2015-04-18 DIAGNOSIS — K219 Gastro-esophageal reflux disease without esophagitis: Secondary | ICD-10-CM

## 2015-04-18 DIAGNOSIS — G609 Hereditary and idiopathic neuropathy, unspecified: Secondary | ICD-10-CM | POA: Diagnosis not present

## 2015-04-18 DIAGNOSIS — G4700 Insomnia, unspecified: Secondary | ICD-10-CM | POA: Diagnosis not present

## 2015-04-18 DIAGNOSIS — I10 Essential (primary) hypertension: Secondary | ICD-10-CM | POA: Diagnosis not present

## 2015-04-18 DIAGNOSIS — J449 Chronic obstructive pulmonary disease, unspecified: Secondary | ICD-10-CM | POA: Diagnosis not present

## 2015-04-18 DIAGNOSIS — M62838 Other muscle spasm: Secondary | ICD-10-CM | POA: Diagnosis not present

## 2015-04-18 DIAGNOSIS — J302 Other seasonal allergic rhinitis: Secondary | ICD-10-CM | POA: Diagnosis not present

## 2015-04-18 DIAGNOSIS — M8580 Other specified disorders of bone density and structure, unspecified site: Secondary | ICD-10-CM | POA: Insufficient documentation

## 2015-04-18 DIAGNOSIS — E785 Hyperlipidemia, unspecified: Secondary | ICD-10-CM | POA: Diagnosis not present

## 2015-04-18 DIAGNOSIS — C50912 Malignant neoplasm of unspecified site of left female breast: Secondary | ICD-10-CM | POA: Diagnosis not present

## 2015-04-18 DIAGNOSIS — IMO0002 Reserved for concepts with insufficient information to code with codable children: Secondary | ICD-10-CM

## 2015-04-18 DIAGNOSIS — G63 Polyneuropathy in diseases classified elsewhere: Secondary | ICD-10-CM

## 2015-04-18 MED ORDER — TELMISARTAN-HCTZ 40-12.5 MG PO TABS: 1.0000 | ORAL_TABLET | Freq: Every day | ORAL | Status: DC

## 2015-04-18 MED ORDER — RANITIDINE HCL 75 MG PO TABS: 300.0000 mg | ORAL_TABLET | ORAL | Status: DC | PRN

## 2015-04-18 MED ORDER — ZOLPIDEM TARTRATE 10 MG PO TABS: 10.0000 mg | ORAL_TABLET | Freq: Every day | ORAL | Status: DC

## 2015-04-18 MED ORDER — LORATADINE 10 MG PO TABS: 10.0000 mg | ORAL_TABLET | ORAL | Status: DC | PRN

## 2015-04-18 MED ORDER — CYCLOBENZAPRINE HCL 10 MG PO TABS: 10.0000 mg | ORAL_TABLET | Freq: Every day | ORAL | Status: DC

## 2015-04-18 MED ORDER — NORTRIPTYLINE HCL 25 MG PO CAPS: 25.0000 mg | ORAL_CAPSULE | Freq: Every day | ORAL | Status: DC

## 2015-04-18 MED ORDER — HYDROCODONE-ACETAMINOPHEN 5-300 MG PO TABS: 1.0000 | ORAL_TABLET | Freq: Every day | ORAL | Status: DC | PRN

## 2015-04-18 NOTE — Progress Notes (Signed)
Name: Brenda Jones   MRN: [id number]    DOB: 1965/10/03   Date:04/18/2015       Progress Note  Subjective  Chief Complaint  Chief Complaint  Patient presents with  . Medication Management    1 month F/U  . Hypertension  . Insomnia    Improving with Medication, with medication-sleeps 5-6 nightly due to shift starts at 3 a.m.   . Gastrophageal Reflux    Still having burning in chest, but takes medication PRN  . COPD    Improving with Inhalers    HPI  HTN: taking bp medication daily, and denies side effects of medication. BP has been at goal. No dizziness, chest pain, no palpitation  Osteopenia: secondary to cancer therapy. Seen by Dr. Grayland Ormond, currently only calcium plus D, will have repeat bone density next April.   Insomnia: medication helps her fall and stay asleep, but wakes up at 2 am to go to work, and sometimes does not have enough hours to sleep. Explained that she needs to take medication ate least 6 hours before waking up time  GERD: she states has symptoms about once a week, usually when eats tomato based meals. She would like to take daily medication, but advised her to take it prn, or to avoid food that triggers symptoms.    COPD: she is down to one or two cigarettes daily, feeling better, off Spiriva and using Combivent prn.  No SOB with activity, denies daily cough.  Muscles Spasms: got much worse with cancer therapy - in 2013 and takes Cyclobenzaprine to control symptoms  Breast cancer left: seeing Dr. Grayland Ormond, s/p lumpectomy still on oral suppressive medication   Peripheral Neuropathy: still has daily pain on both legs and feet also on left arm. She takes Pamelor at night, but also takes hydrodocodone prn for pain. The pain is secondary to left breast lumpectomy - causes left arm soreness and also chemotherapy  Patient Active Problem List   Diagnosis Date Noted  . Osteopenia due to cancer therapy 04/18/2015  . GERD (gastroesophageal reflux disease) 04/18/2015   . Muscle spasm 04/18/2015  . Benign neoplasm of descending colon   . Rectal polyp   . Cervical high risk HPV (human papillomavirus) test positive 03/22/2015  . Benign essential HTN 01/08/2015  . Insomnia, persistent 01/08/2015  . COPD, mild 01/08/2015  . HLD (hyperlipidemia) 01/08/2015  . Dysmetabolic syndrome 33/82/5053  . Adult BMI 30+ 01/08/2015  . Seasonal allergic rhinitis 01/08/2015  . Secondary peripheral neuropathy 01/08/2015  . Arthralgia of multiple joints 01/08/2015  . Breast carcinoma 02/21/2011    Past Surgical History  Procedure Laterality Date  . Bladder surgery  2007  . Breast surgery    . Breast lumpectomy  2013  . Colonoscopy with propofol N/A 04/04/2015    Procedure: COLONOSCOPY WITH PROPOFOL;  Surgeon: [name], MD;  Location: [facility];  Service: Endoscopy;  Laterality: N/A;  . Polypectomy  04/04/2015    Procedure: POLYPECTOMY;  Surgeon: [name], MD;  Location: [facility];  Service: Endoscopy;;    Family History  Problem Relation Age of Onset  . Heart disease Mother   . Diabetes Father   . Hypertension Father     Social History   Social History  . Marital Status: Single    Spouse Name: N/A  . Number of Children: N/A  . Years of Education: N/A   Occupational History  . Not on file.   Social History Main Topics  . Smoking status:  Current Every Day Smoker -- 0.25 packs/day for 35 years    Types: Cigarettes  . Smokeless tobacco: Not on file  . Alcohol Use: No  . Drug Use: No  . Sexual Activity: No   Other Topics Concern  . Not on file   Social History Narrative     Current outpatient prescriptions:  .  anastrozole (ARIMIDEX) 1 MG tablet, Take 1 tablet (1 mg total) by mouth daily., Disp: 30 tablet, Rfl: 0 .  aspirin 81 MG tablet, Take 1 tablet by mouth daily., Disp: , Rfl:  .  cyclobenzaprine (FLEXERIL) 10 MG tablet, Take 1 tablet (10 mg total) by mouth at bedtime., Disp: 30 tablet, Rfl: 2 .  fluticasone  (FLONASE) 50 MCG/ACT nasal spray, Place 2 sprays into the nose as needed., Disp: , Rfl:  .  Hydrocodone-Acetaminophen (VICODIN) 5-300 MG TABS, Take 1 tablet by mouth daily as needed., Disp: 30 each, Rfl: 0 .  Ipratropium-Albuterol (COMBIVENT RESPIMAT) 20-100 MCG/ACT AERS respimat, Inhale 1 puff into the lungs as needed., Disp: , Rfl:  .  loratadine (CLARITIN) 10 MG tablet, Take 1 tablet (10 mg total) by mouth as needed for allergies., Disp: 30 tablet, Rfl: 2 .  nortriptyline (PAMELOR) 25 MG capsule, Take 1 capsule (25 mg total) by mouth daily., Disp: 30 capsule, Rfl: 2 .  ranitidine (ZANTAC) 75 MG tablet, Take 4 tablets (300 mg total) by mouth as needed for heartburn., Disp: 30 tablet, Rfl: 2 .  telmisartan-hydrochlorothiazide (MICARDIS HCT) 40-12.5 MG per tablet, Take 1 tablet by mouth daily., Disp: 30 tablet, Rfl: 2 .  tiotropium (SPIRIVA HANDIHALER) 18 MCG inhalation capsule, Place 1 puff into inhaler and inhale as needed., Disp: , Rfl:  .  zolpidem (AMBIEN) 10 MG tablet, Take 1 tablet (10 mg total) by mouth at bedtime., Disp: 30 tablet, Rfl: 2  Allergies  Allergen Reactions  . Ace Inhibitors Cough     ROS  Constitutional: Negative for fever , mild  weight change.  Respiratory: Negative for cough and shortness of breath.   Cardiovascular: Negative for chest pain or palpitations.  Gastrointestinal: Positive  for abdominal pain ( mild discomfort since colonoscopy a couple of weeks ago - advised to discuss it with Dr. Durwin Reges ) , no bowel changes.  Musculoskeletal: Negative for gait problem or joint swelling.  Skin: Negative for rash.  Neurological: Negative for dizziness or headache.  No other specific complaints in a complete review of systems (except as listed in HPI above).  Objective  Filed Vitals:   04/18/15 0829  BP: 128/68  Pulse: 96  Temp: 98.2 F (36.8 C)  TempSrc: Oral  Resp: 18  Height: 5\' 5"  (1.651 m)  Weight: 206 lb 14.4 oz (93.849 kg)  SpO2: 97%    Body mass  index is 34.43 kg/(m^2).  Physical Exam  Constitutional: Patient appears well-developed and well-nourished. Obese No distress.  HEENT: head atraumatic, normocephalic, pupils equal and reactive to light, neck supple, throat within normal limits Cardiovascular: Normal rate, regular rhythm and normal heart sounds.  No murmur heard. No BLE edema. Pulmonary/Chest: Effort normal and breath sounds normal. No respiratory distress. Abdominal: Soft.  There is mild diffuse discomfort, no guarding or rebound Psychiatric: Patient has a normal mood and affect. behavior is normal. Judgment and thought content normal.  Recent Results (from the past 2160 hour(s))  Pap IG, CT/NG NAA, and HPV (high risk)     Status: Abnormal   Collection Time: 03/17/15 12:00 AM  Result Value Ref Range  DIAGNOSIS: Comment     Comment: NEGATIVE FOR INTRAEPITHELIAL LESION AND MALIGNANCY. FUNGAL ORGANISMS MORPHOLOGICALLY CONSISTENT WITH CANDIDA SPECIES ARE PRESENT. THIS SPECIMEN WAS RESCREENED AS PART OF OUR QUALITY CONTROL PROGRAM.    Specimen adequacy: Comment     Comment: Satisfactory for evaluation. Endocervical and/or squamous metaplastic cells (endocervical component) are present.    CLINICIAN PROVIDED ICD10: Comment     Comment: Z12.4   Performed by: Comment     Comment: Crissie Figures, Cytotechnologist (ASCP)   QC reviewed by: Comment     Comment: Ranae Palms, Supervisory Cytotechnologist (ASCP)   PAP SMEAR COMMENT .    PATHOLOGIST PROVIDED ICD10: Comment     Comment: R87.5   Note: Comment     Comment: The Pap smear is a screening test designed to aid in the detection of premalignant and malignant conditions of the uterine cervix.  It is not a diagnostic procedure and should not be used as the sole means of detecting cervical cancer.  Both false-positive and false-negative reports do occur.    Test Methodology Comment     Comment: This liquid based ThinPrep(R) pap test was screened with the use of an  image guided system.    HPV, high-risk Positive (A) Negative    Comment: This high-risk HPV test detects thirteen high-risk types (16/18/31/33/35/39/45/51/52/56/58/59/68) without differentiation.   Cancer antigen 27.29     Status: None   Collection Time: 04/11/15 10:16 AM  Result Value Ref Range   CA 27.29 5.2 0.0 - 38.6 U/mL    Comment: (NOTE) [facility]/ACS methodology Performed At: [facility] [place], [place] [id number] Lindon Romp MD GY:[id number]      PHQ2/9: Depression screen [facility] 2/9 03/17/2015  Decreased Interest 0  Down, Depressed, Hopeless 0  PHQ - 2 Score 0    Fall Risk: Fall Risk  03/17/2015  Falls in the past year? No     Assessment & Plan  1. Benign essential HTN  At goal, continue medication  - telmisartan-hydrochlorothiazide (MICARDIS HCT) 40-12.5 MG per tablet; Take 1 tablet by mouth daily.  Dispense: 30 tablet; Refill: 2  2. Osteopenia due to cancer therapy  Will have repeat bone density ordered by Dr. Grayland Ormond next April   3. COPD, mild   doing better since she cut down on cigarette smoking, not ready to quit, but was advised to do so  4. Insomnia, persistent  - zolpidem (AMBIEN) 10 MG tablet; Take 1 tablet (10 mg total) by mouth at bedtime.  Dispense: 30 tablet; Refill: 2  5. Secondary peripheral neuropathy  - nortriptyline (PAMELOR) 25 MG capsule; Take 1 capsule (25 mg total) by mouth daily.  Dispense: 30 capsule; Refill: 2 - Hydrocodone-Acetaminophen (VICODIN) 5-300 MG TABS; Take 1 tablet by mouth daily as needed.  Dispense: 30 each; Refill: 0  6. Carcinoma of left breast   continue oncologist follow up  7. Dysmetabolic syndrome  Needs to have labs done  8. HLD (hyperlipidemia)  Needs to have labs done  9. Gastroesophageal reflux disease without esophagitis  - ranitidine (ZANTAC) 75 MG tablet; Take 4 tablets (300 mg total) by mouth as needed for heartburn.  Dispense: 30 tablet; Refill: 2  10.  Muscle spasm  - cyclobenzaprine (FLEXERIL) 10 MG tablet; Take 1 tablet (10 mg total) by mouth at bedtime.  Dispense: 30 tablet; Refill: 2  11. Seasonal allergic rhinitis  - loratadine (CLARITIN) 10 MG tablet; Take 1 tablet (10 mg total) by mouth as needed for allergies.  Dispense: 30 tablet; Refill: 2

## 2015-05-16 ENCOUNTER — Other Ambulatory Visit: Payer: Self-pay | Admitting: *Deleted

## 2015-05-16 MED ORDER — ANASTROZOLE 1 MG PO TABS: 1.0000 mg | ORAL_TABLET | Freq: Every day | ORAL | Status: DC

## 2015-05-16 NOTE — Addendum Note (Signed)
Addended by: Betti Cruz on: 05/16/2015 11:33 AM   Modules accepted: Orders

## 2015-06-30 ENCOUNTER — Other Ambulatory Visit: Payer: Self-pay | Admitting: Family Medicine

## 2015-06-30 MED ORDER — VALSARTAN-HYDROCHLOROTHIAZIDE 160-12.5 MG PO TABS
1.0000 | ORAL_TABLET | Freq: Every day | ORAL | Status: DC
Start: 2015-06-30 — End: 2015-08-04

## 2015-07-19 ENCOUNTER — Ambulatory Visit: Payer: PRIVATE HEALTH INSURANCE | Admitting: Family Medicine

## 2015-07-29 ENCOUNTER — Telehealth: Payer: Self-pay | Admitting: Family Medicine

## 2015-07-29 NOTE — Telephone Encounter (Signed)
Patient had a appointment schedule for 07/19/2015-Medication Refill, but was a no show and would now like a refill of her Ambien.

## 2015-07-29 NOTE — Telephone Encounter (Signed)
Patient was informed and schedule a appointment on 08/03/14 for her medication refills.

## 2015-07-29 NOTE — Telephone Encounter (Signed)
Controlled medication, needs to be seen

## 2015-07-29 NOTE — Telephone Encounter (Signed)
Requesting a refill on Ambien please send to [facility]. She is completely out

## 2015-08-04 ENCOUNTER — Ambulatory Visit (INDEPENDENT_AMBULATORY_CARE_PROVIDER_SITE_OTHER): Payer: 59 | Admitting: Family Medicine

## 2015-08-04 ENCOUNTER — Encounter: Payer: Self-pay | Admitting: Family Medicine

## 2015-08-04 VITALS — BP 118/62 | HR 130 | Temp 98.3°F | Resp 18 | Ht 65.0 in | Wt 211.5 lb

## 2015-08-04 DIAGNOSIS — M899 Disorder of bone, unspecified: Secondary | ICD-10-CM | POA: Diagnosis not present

## 2015-08-04 DIAGNOSIS — Z114 Encounter for screening for human immunodeficiency virus [HIV]: Secondary | ICD-10-CM | POA: Diagnosis not present

## 2015-08-04 DIAGNOSIS — G63 Polyneuropathy in diseases classified elsewhere: Secondary | ICD-10-CM

## 2015-08-04 DIAGNOSIS — G4700 Insomnia, unspecified: Secondary | ICD-10-CM | POA: Diagnosis not present

## 2015-08-04 DIAGNOSIS — I10 Essential (primary) hypertension: Secondary | ICD-10-CM | POA: Diagnosis not present

## 2015-08-04 DIAGNOSIS — C50912 Malignant neoplasm of unspecified site of left female breast: Secondary | ICD-10-CM | POA: Diagnosis not present

## 2015-08-04 DIAGNOSIS — J449 Chronic obstructive pulmonary disease, unspecified: Secondary | ICD-10-CM

## 2015-08-04 DIAGNOSIS — E8881 Metabolic syndrome: Secondary | ICD-10-CM

## 2015-08-04 DIAGNOSIS — M8580 Other specified disorders of bone density and structure, unspecified site: Secondary | ICD-10-CM

## 2015-08-04 DIAGNOSIS — E785 Hyperlipidemia, unspecified: Secondary | ICD-10-CM | POA: Diagnosis not present

## 2015-08-04 DIAGNOSIS — G609 Hereditary and idiopathic neuropathy, unspecified: Secondary | ICD-10-CM

## 2015-08-04 DIAGNOSIS — Z720 Tobacco use: Secondary | ICD-10-CM

## 2015-08-04 DIAGNOSIS — M62838 Other muscle spasm: Secondary | ICD-10-CM

## 2015-08-04 DIAGNOSIS — IMO0002 Reserved for concepts with insufficient information to code with codable children: Secondary | ICD-10-CM

## 2015-08-04 MED ORDER — GABAPENTIN 100 MG PO CAPS: 100.0000 mg | ORAL_CAPSULE | Freq: Three times a day (TID) | ORAL | Status: DC

## 2015-08-04 MED ORDER — VALSARTAN-HYDROCHLOROTHIAZIDE 80-12.5 MG PO TABS: 1.0000 | ORAL_TABLET | Freq: Every day | ORAL | Status: DC

## 2015-08-04 MED ORDER — HYDROCODONE-ACETAMINOPHEN 5-300 MG PO TABS: 1.0000 | ORAL_TABLET | Freq: Every day | ORAL | Status: DC | PRN

## 2015-08-04 MED ORDER — ZOLPIDEM TARTRATE 10 MG PO TABS: 10.0000 mg | ORAL_TABLET | Freq: Every day | ORAL | Status: DC

## 2015-08-04 MED ORDER — NORTRIPTYLINE HCL 25 MG PO CAPS: 25.0000 mg | ORAL_CAPSULE | Freq: Every day | ORAL | Status: DC

## 2015-08-04 MED ORDER — CYCLOBENZAPRINE HCL 10 MG PO TABS: 10.0000 mg | ORAL_TABLET | Freq: Every day | ORAL | Status: DC

## 2015-08-04 NOTE — Progress Notes (Signed)
Name: Brenda Jones   MRN: [id number]    DOB: 25-Jul-1965   Date:08/04/2015       Progress Note  Subjective  Chief Complaint  Chief Complaint  Patient presents with  . Medication Refill    4 month F/U  . Hypertension    Has been lighthead occasionally  . Insomnia    Well controlled with medication but ran out due to missing her appointment and has been trouble falling staying asleep will wake up every 2 hours with no medication. Patient has to be at work at 3 a.m.  . Allergic Rhinitis     Stable  . COPD    Well controlled with inhalers  . Gastroesophageal Reflux    Stable with medication-acid reflux pills  . Peripheral Neuropathy    Unchanged  . Spasms    Unchanged    HPI  HTN: taking bp medication daily, she has been noticing some dizziness- usually after she takes medication in am's, no  chest pain, no palpitation. 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 Seen by Dr. Grayland Ormond, currently only calcium plus D, will have repeat bone density next April, we will check vitamin D level today  Insomnia: medication helps her fall and stay asleep, but wakes up at 2 am to go to work. She has been doing better about going to be earlier and getting at least 7 hours of sleep   COPD: she is still smoking, now about 3 a day, feeling better, off Spiriva and using Combivent prn. No SOB with activity, denies daily cough, no wheezing.  Muscles Spasms: got much worse with cancer therapy - in 2013 and takes Cyclobenzaprine to control symptoms. It can be on her trunk or legs, due for labs today  Breast cancer left: seeing Dr. Grayland Ormond, s/p lumpectomy still on oral suppressive medication, mammogram is up to date  Peripheral Neuropathy: still has daily pain on both legs and feet also on left arm. She takes Pamelor at night, but also takes hydrodocodone prn for pain. The pain is secondary to left breast lumpectomy - causes left arm soreness  and also chemotherapy. She states pain is a 7/10 average.   Patient Active Problem List   Diagnosis Date Noted  . Osteopenia due to cancer therapy 04/18/2015  . GERD (gastroesophageal reflux disease) 04/18/2015  . Muscle spasm 04/18/2015  . Benign neoplasm of descending colon   . Rectal polyp   . Cervical high risk HPV (human papillomavirus) test positive 03/22/2015  . Benign essential HTN 01/08/2015  . Insomnia, persistent 01/08/2015  . COPD, mild ([place]) 01/08/2015  . HLD (hyperlipidemia) 01/08/2015  . Dysmetabolic syndrome [id number]  . Adult BMI 30+ 01/08/2015  . Seasonal allergic rhinitis 01/08/2015  . Secondary peripheral neuropathy 01/08/2015  . Arthralgia of multiple joints 01/08/2015  . Breast carcinoma ([place]) 02/21/2011    Past Surgical History  Procedure Laterality Date  . Bladder surgery  2007  . Breast surgery    . Breast lumpectomy  2013  . Colonoscopy with propofol N/A 04/04/2015    Procedure: COLONOSCOPY WITH PROPOFOL;  Surgeon: [name], MD;  Location: [place];  Service: Endoscopy;  Laterality: N/A;  . Polypectomy  04/04/2015    Procedure: POLYPECTOMY;  Surgeon: [name], MD;  Location: [place];  Service: Endoscopy;;    Family History  Problem Relation Age of Onset  . Heart disease Mother   . Diabetes Father   . Hypertension  Father     Social History   Social History  . Marital Status: Single    Spouse Name: N/A  . Number of Children: N/A  . Years of Education: N/A   Occupational History  . Not on file.   Social History Main Topics  . Smoking status: Current Every Day Smoker -- 0.25 packs/day for 35 years    Types: Cigarettes  . Smokeless tobacco: Not on file  . Alcohol Use: No  . Drug Use: No  . Sexual Activity: No   Other Topics Concern  . Not on file   Social History Narrative     Current outpatient prescriptions:  .  anastrozole (ARIMIDEX) 1 MG tablet, Take 1 tablet (1 mg total) by mouth daily., Disp: 90  tablet, Rfl: 1 .  aspirin 81 MG tablet, Take 1 tablet by mouth daily., Disp: , Rfl:  .  cyclobenzaprine (FLEXERIL) 10 MG tablet, Take 1 tablet (10 mg total) by mouth at bedtime., Disp: 30 tablet, Rfl: 2 .  fluticasone (FLONASE) 50 MCG/ACT nasal spray, Place 2 sprays into the nose as needed., Disp: , Rfl:  .  Hydrocodone-Acetaminophen (VICODIN) 5-300 MG TABS, Take 1 tablet by mouth daily as needed., Disp: 30 each, Rfl: 0 .  Ipratropium-Albuterol (COMBIVENT RESPIMAT) 20-100 MCG/ACT AERS respimat, Inhale 1 puff into the lungs as needed., Disp: , Rfl:  .  loratadine (CLARITIN) 10 MG tablet, Take 1 tablet (10 mg total) by mouth as needed for allergies., Disp: 30 tablet, Rfl: 2 .  nortriptyline (PAMELOR) 25 MG capsule, Take 1 capsule (25 mg total) by mouth daily., Disp: 30 capsule, Rfl: 2 .  ranitidine (ZANTAC) 75 MG tablet, Take 4 tablets (300 mg total) by mouth as needed for heartburn., Disp: 30 tablet, Rfl: 2 .  tiotropium (SPIRIVA HANDIHALER) 18 MCG inhalation capsule, Place 1 puff into inhaler and inhale as needed., Disp: , Rfl:  .  zolpidem (AMBIEN) 10 MG tablet, Take 1 tablet (10 mg total) by mouth at bedtime., Disp: 30 tablet, Rfl: 2 .  valsartan-hydrochlorothiazide (DIOVAN-HCT) 80-12.5 MG tablet, Take 1 tablet by mouth daily., Disp: 30 tablet, Rfl: 2  Allergies  Allergen Reactions  . Ace Inhibitors Cough     ROS  Constitutional: Negative for fever or weight change.  Respiratory: Negative for cough and shortness of breath.   Cardiovascular: Negative for chest pain or palpitations.  Gastrointestinal: Negative for abdominal pain, no bowel changes.  Musculoskeletal: Negative for gait problem or joint swelling.  Skin: Negative for rash.  Neurological: Negative for dizziness or headache.  No other specific complaints in a complete review of systems (except as listed in HPI above).  Objective  Filed Vitals:   08/04/15 1102  BP: 118/62  Pulse: 130  Temp: 98.3 F (36.8 C)  TempSrc:  Oral  Resp: 18  Height: 5\' 5"  (1.651 m)  Weight: 211 lb 8 oz (95.936 kg)  SpO2: 98%    Body mass index is 35.2 kg/(m^2).  Physical Exam  Constitutional: Patient appears well-developed and well-nourished. Obese  No distress.  HEENT: head atraumatic, normocephalic, pupils equal and reactive to light,  neck supple, throat within normal limits Cardiovascular: Normal rate, regular rhythm and normal heart sounds.  No murmur heard. No BLE edema. Pulmonary/Chest: Effort normal and breath sounds normal. No respiratory distress. Abdominal: Soft.  There is no tenderness. Psychiatric: Patient has a normal mood and affect. behavior is normal. Judgment and thought content normal.  PHQ2/9: Depression screen [facility] 2/9 08/04/2015 03/17/2015  Decreased Interest 0  0  Down, Depressed, Hopeless 0 0  PHQ - 2 Score 0 0     Fall Risk: Fall Risk  08/04/2015 03/17/2015  Falls in the past year? No No     Functional Status Survey: Is the patient deaf or have difficulty hearing?: No Does the patient have difficulty seeing, even when wearing glasses/contacts?: Yes (glasses) Does the patient have difficulty concentrating, remembering, or making decisions?: No Does the patient have difficulty walking or climbing stairs?: No Does the patient have difficulty dressing or bathing?: No Does the patient have difficulty doing errands alone such as visiting a doctor's office or shopping?: No   Assessment & Plan  1. Benign essential HTN  Adjust dose, she is having dizziness with Micardis 40/12.5 - valsartan-hydrochlorothiazide (DIOVAN-HCT) 80-12.5 MG tablet; Take 1 tablet by mouth daily.  Dispense: 30 tablet; Refill: 2 - Comprehensive metabolic panel  2. Carcinoma of left breast ([place])  Continue follow up with Dr. Grayland Ormond  3. Insomnia, persistent  Doing well at this time - zolpidem (AMBIEN) 10 MG tablet; Take 1 tablet (10 mg total) by mouth at bedtime.  Dispense: 30 tablet; Refill: 2  4. COPD, mild  ([place])  Needs to quit smoking  5. Dysmetabolic syndrome  - Hemoglobin A1c  6. HLD (hyperlipidemia)  - Lipid panel  7. Secondary peripheral neuropathy  She has tried gabapentin but is willing to try it again  - nortriptyline (PAMELOR) 25 MG capsule; Take 1 capsule (25 mg total) by mouth daily.  Dispense: 30 capsule; Refill: 2 - Hydrocodone-Acetaminophen (VICODIN) 5-300 MG TABS; Take 1 tablet by mouth daily as needed.  Dispense: 30 each; Refill: 0 - gabapentin (NEURONTIN) 100 MG capsule; Take 1 capsule (100 mg total) by mouth 3 (three) times daily. Gradually go up on dose to a max of 300 mg three time daily  Dispense: 100 capsule; Refill: 0  8. Muscle spasm  - cyclobenzaprine (FLEXERIL) 10 MG tablet; Take 1 tablet (10 mg total) by mouth at bedtime.  Dispense: 30 tablet; Refill: 2 - Magnesium  9. Tobacco use  - Provide Smoking / Tobacco cessation education.  Provide education material and information on community counseling.  10. Encounter for screening for HIV  - HIV antibody  11. Osteopenia due to cancer therapy  - VITAMIN D 25 Hydroxy (Vit-D Deficiency, Fractures)

## 2015-08-04 NOTE — Patient Instructions (Signed)
Tobacco Use Disorder Tobacco use disorder (TUD) is a mental disorder. It is the long-term use of tobacco in spite of related health problems or difficulty with normal life activities. Tobacco is most commonly smoked as cigarettes and less commonly as cigars or pipes. Smokeless chewing tobacco and snuff are also popular. People with TUD get a feeling of extreme pleasure (euphoria) from using tobacco and have a desire to use it again and again. Repeated use of tobacco can cause problems. The addictive effects of tobacco are due mainly tothe ingredient nicotine. Nicotine also causes a rush of adrenaline (epinephrine) in the body. This leads to increased blood pressure, heart rate, and breathing rate. These changes may cause problems for people with high blood pressure, weak hearts, or lung disease. High doses of nicotine in children and pets can lead to seizures and death.  Tobacco contains a number of other unsafe chemicals. These chemicals are especially harmful when inhaled as smoke and can damage almost every organ in the body. Smokers live shorter lives than nonsmokers and are at risk of dying from a number of diseases and cancers. Tobacco smoke can also cause health problems for nonsmokers (due to inhaling secondhand smoke). Smoking is also a fire hazard.  TUD usually starts in the late teenage years and is most common in young adults between the ages of 18 and 25 years. People who start smoking earlier in life are more likely to continue smoking as adults. TUD is somewhat more common in men than women. People with TUD are at higher risk for using alcohol and other drugs of abuse. RISK FACTORS Risk factors for TUD include:   Having family members with the disorder.  Being around people who use tobacco.  Having an existing mental health issue such as schizophrenia, depression, bipolar disorder, ADHD, or posttraumatic stress disorder (PTSD). SIGNS AND SYMPTOMS  People with tobacco use disorder have  two or more of the following signs and symptoms within 12 months:   Use of more tobacco over a longer period than intended.   Not able to cut down or control tobacco use.   A lot of time spent obtaining or using tobacco.   Strong desire or urge to use tobacco (craving). Cravings may last for 6 months or longer after quitting.  Use of tobacco even when use leads to major problems at work, school, or home.   Use of tobacco even when use leads to relationship problems.   Giving up or cutting down on important life activities because of tobacco use.   Repeatedly using tobacco in situations where it puts you or others in physical danger, like smoking in bed.   Use of tobacco even when it is known that a physical or mental problem is likely related to tobacco use.   Physical problems are numerous and may include chronic bronchitis, emphysema, lung and other cancers, gum disease, high blood pressure, heart disease, and stroke.   Mental problems caused by tobacco may include difficulty sleeping and anxiety.  Need to use greater amounts of tobacco to get the same effect. This means you have developed a tolerance.   Withdrawal symptoms as a result of stopping or rapidly cutting back use. These symptoms may last a month or more after quitting and include the following:   Depressed, anxious, or irritable mood.   Difficulty concentrating.   Increased appetite.  Restlessness or trouble sleeping.   Use of tobacco to avoid withdrawal symptoms. DIAGNOSIS  Tobacco use disorder is diagnosed by   your health care provider. A diagnosis may be made by:  Your health care provider asking questions about your tobacco use and any problems it may be causing.  A physical exam.  Lab tests.  You may be referred to a mental health professional or addiction specialist. The severity of tobacco use disorder depends on the number of signs and symptoms you have:   Mild--Two or three  symptoms.  Moderate--Four or five symptoms.   Severe--Six or more symptoms.  TREATMENT  Many people with tobacco use disorder are unable to quit on their own and need help. Treatment options include the following:  Nicotine replacement therapy (NRT). NRT provides nicotine without the other harmful chemicals in tobacco. NRT gradually lowers the dosage of nicotine in the body and reduces withdrawal symptoms. NRT is available in over-the-counter forms (gum, lozenges, and skin patches) as well as prescription forms (mouth inhaler and nasal spray).  Medicines.This may include:  Antidepressant medicine that may reduce nicotine cravings.  A medicine that acts on nicotine receptors in the brain to reduce cravings and withdrawal symptoms. It may also block the effects of tobacco in people with TUD who relapse.  Counseling or talk therapy. A form of talk therapy called behavioral therapy is commonly used to treat people with TUD. Behavioral therapy looks at triggers for tobacco use, how to avoid them, and how to cope with cravings. It is most effective in person or by phone but is also available in self-help forms (books and Internet websites).  Support groups. These provide emotional support, advice, and guidance for quitting tobacco. The most effective treatment for TUD is usually a combination of medicine, talk therapy, and support groups. HOME CARE INSTRUCTIONS  Keep all follow-up visits as directed by your health care provider. This is important.  Take medicines only as directed by your health care provider.  Check with your health care provider before starting new prescription or over-the-counter medicines. SEEK MEDICAL CARE IF:  You are not able to take your medicines as prescribed.  Treatment is not helping your TUD and your symptoms get worse. SEEK IMMEDIATE MEDICAL CARE IF:  You have serious thoughts about hurting yourself or others.  You have trouble breathing, chest pain,  sudden weakness, or sudden numbness in part of your body.   This information is not intended to replace advice given to you by your health care provider. Make sure you discuss any questions you have with your health care provider.   Document Released: 03/14/2004 Document Revised: 07/30/2014 Document Reviewed: 09/04/2013 [employer] Interactive Patient Education 2016 [employer]  

## 2015-10-27 ENCOUNTER — Ambulatory Visit
Admission: RE | Admit: 2015-10-27 | Discharge: 2015-10-27 | Disposition: A | Discharge status: Home / Self Care | Payer: 59 | Source: Ambulatory Visit | Attending: Oncology | Admitting: Oncology

## 2015-10-27 ENCOUNTER — Other Ambulatory Visit: Payer: Self-pay | Admitting: Oncology

## 2015-10-27 DIAGNOSIS — M8580 Other specified disorders of bone density and structure, unspecified site: Secondary | ICD-10-CM | POA: Insufficient documentation

## 2015-10-27 DIAGNOSIS — C50912 Malignant neoplasm of unspecified site of left female breast: Secondary | ICD-10-CM

## 2015-10-27 DIAGNOSIS — Z853 Personal history of malignant neoplasm of breast: Secondary | ICD-10-CM | POA: Insufficient documentation

## 2015-10-27 DIAGNOSIS — Z13820 Encounter for screening for osteoporosis: Secondary | ICD-10-CM | POA: Insufficient documentation

## 2015-10-27 HISTORY — DX: Malignant neoplasm of unspecified site of unspecified female breast: C50.919

## 2015-11-07 ENCOUNTER — Telehealth: Payer: Self-pay | Admitting: Oncology

## 2015-11-07 ENCOUNTER — Inpatient Hospital Stay: Payer: 59 | Attending: Oncology | Admitting: Oncology

## 2015-11-07 VITALS — BP 143/88 | HR 79 | Temp 96.7°F | Resp 16 | Wt 205.5 lb

## 2015-11-07 DIAGNOSIS — IMO0002 Reserved for concepts with insufficient information to code with codable children: Secondary | ICD-10-CM

## 2015-11-07 DIAGNOSIS — G4700 Insomnia, unspecified: Secondary | ICD-10-CM

## 2015-11-07 DIAGNOSIS — J449 Chronic obstructive pulmonary disease, unspecified: Secondary | ICD-10-CM

## 2015-11-07 DIAGNOSIS — M2550 Pain in unspecified joint: Secondary | ICD-10-CM | POA: Diagnosis not present

## 2015-11-07 DIAGNOSIS — Z170 Estrogen receptor positive status [ER+]: Secondary | ICD-10-CM | POA: Diagnosis not present

## 2015-11-07 DIAGNOSIS — G629 Polyneuropathy, unspecified: Secondary | ICD-10-CM | POA: Diagnosis not present

## 2015-11-07 DIAGNOSIS — E669 Obesity, unspecified: Secondary | ICD-10-CM | POA: Diagnosis not present

## 2015-11-07 DIAGNOSIS — Z79811 Long term (current) use of aromatase inhibitors: Secondary | ICD-10-CM | POA: Diagnosis not present

## 2015-11-07 DIAGNOSIS — Z79899 Other long term (current) drug therapy: Secondary | ICD-10-CM | POA: Diagnosis not present

## 2015-11-07 DIAGNOSIS — F17210 Nicotine dependence, cigarettes, uncomplicated: Secondary | ICD-10-CM

## 2015-11-07 DIAGNOSIS — G63 Polyneuropathy in diseases classified elsewhere: Secondary | ICD-10-CM

## 2015-11-07 DIAGNOSIS — C50912 Malignant neoplasm of unspecified site of left female breast: Secondary | ICD-10-CM

## 2015-11-07 DIAGNOSIS — M8580 Other specified disorders of bone density and structure, unspecified site: Secondary | ICD-10-CM | POA: Diagnosis not present

## 2015-11-07 DIAGNOSIS — I10 Essential (primary) hypertension: Secondary | ICD-10-CM | POA: Diagnosis not present

## 2015-11-07 DIAGNOSIS — Z7982 Long term (current) use of aspirin: Secondary | ICD-10-CM | POA: Diagnosis not present

## 2015-11-07 MED ORDER — ANASTROZOLE 1 MG PO TABS: 1.0000 mg | ORAL_TABLET | Freq: Every day | ORAL | Status: DC

## 2015-11-07 MED ORDER — GABAPENTIN 100 MG PO CAPS: 100.0000 mg | ORAL_CAPSULE | Freq: Three times a day (TID) | ORAL | Status: DC

## 2015-11-07 NOTE — Progress Notes (Signed)
[place]  Telephone:([phone]) [phone] Fax:(336) [phone]  ID: Brenda Jones OB: Dec 05, 1965  MR#: [id number]  BMW#:[id number]  Patient Care Team: Brenda Sizer, MD as PCP - General (Family Medicine)  CHIEF COMPLAINT:  Chief Complaint  Patient presents with  . Breast Cancer    INTERVAL HISTORY: Patient returns to clinic today for routine 6 month evaluation.  She continues to have peripheral neuropathy that is painful throughout the day. She currently takes gabapentin 100 mg in the morning and noon and 300 mg at bedtime.  She is tolerating anastrozole well without significant side effects. Hot flashes and joint pain are minor and tolerable.  She has had no recent fevers or illnesses.  She denies any pain.  She has a good appetite and denies weight loss.  She has no chest pain or shortness of breath.  She denies any nausea, vomiting, constipation, or diarrhea.  She has no urinary complaints.  Patient offers no further specific complaints today.  REVIEW OF SYSTEMS:   Review of Systems  Constitutional: Negative.   Respiratory: Negative.   Cardiovascular: Negative.   Musculoskeletal: Positive for joint pain.  Neurological: Positive for sensory change.       With pain    As per HPI. Otherwise, a complete review of systems is negatve.  PAST MEDICAL HISTORY: Past Medical History  Diagnosis Date  . Allergy   . COPD (chronic obstructive pulmonary disease) ([place])   . Hypertension   . Metabolic syndrome   . Insomnia   . Arthritis   . Obesity   . Peripheral neuropathy ([place])   . Breast CA ([place])   . Headache     occasional - thinks it is from BP meds  . Breast cancer ([place]) 2013    left breast lumpectomy with chemo and rad tx    PAST SURGICAL HISTORY: Past Surgical History  Procedure Laterality Date  . Bladder surgery  2007  . Breast surgery    . Breast lumpectomy  2013  . Colonoscopy with propofol N/A 04/04/2015    Procedure: COLONOSCOPY WITH PROPOFOL;  Surgeon:  Brenda Lame, MD;  Location: [facility];  Service: Endoscopy;  Laterality: N/A;  . Polypectomy  04/04/2015    Procedure: POLYPECTOMY;  Surgeon: Brenda Lame, MD;  Location: [facility];  Service: Endoscopy;;    FAMILY HISTORY Family History  Problem Relation Age of Onset  . Heart disease Mother   . Diabetes Father   . Hypertension Father        ADVANCED DIRECTIVES:    HEALTH MAINTENANCE: Social History  Substance Use Topics  . Smoking status: Current Every Day Smoker -- 0.25 packs/day for 35 years    Types: Cigarettes  . Smokeless tobacco: Not on file  . Alcohol Use: No     Allergies  Allergen Reactions  . Ace Inhibitors Cough    Current Outpatient Prescriptions  Medication Sig Dispense Refill  . anastrozole (ARIMIDEX) 1 MG tablet Take 1 tablet (1 mg total) by mouth daily. 90 tablet 1  . aspirin 81 MG tablet Take 1 tablet by mouth daily.    . cyclobenzaprine (FLEXERIL) 10 MG tablet Take 1 tablet (10 mg total) by mouth at bedtime. 30 tablet 2  . fluticasone (FLONASE) 50 MCG/ACT nasal spray Place 2 sprays into the nose as needed.    . gabapentin (NEURONTIN) 100 MG capsule Take 1 capsule (100 mg total) by mouth 3 (three) times daily. 221m morning and noon, 300 mg at bedtime - may increase to  300 mg TID PRN 90 capsule 1  . Hydrocodone-Acetaminophen (VICODIN) 5-300 MG TABS Take 1 tablet by mouth daily as needed. 30 each 0  . Ipratropium-Albuterol (COMBIVENT RESPIMAT) 20-100 MCG/ACT AERS respimat Inhale 1 puff into the lungs as needed.    . loratadine (CLARITIN) 10 MG tablet Take 1 tablet (10 mg total) by mouth as needed for allergies. 30 tablet 2  . nortriptyline (PAMELOR) 25 MG capsule Take 1 capsule (25 mg total) by mouth daily. 30 capsule 2  . ranitidine (ZANTAC) 75 MG tablet Take 4 tablets (300 mg total) by mouth as needed for heartburn. 30 tablet 2  . tiotropium (SPIRIVA HANDIHALER) 18 MCG inhalation capsule Place 1 puff into inhaler and inhale as needed.      . valsartan-hydrochlorothiazide (DIOVAN-HCT) 80-12.5 MG tablet Take 1 tablet by mouth daily. 30 tablet 2  . zolpidem (AMBIEN) 10 MG tablet Take 1 tablet (10 mg total) by mouth at bedtime. 30 tablet 2   No current facility-administered medications for this visit.    OBJECTIVE: Filed Vitals:   11/07/15 1056  BP: 143/88  Pulse: 79  Temp: 96.7 F (35.9 C)  Resp: 16     Body mass index is 34.19 kg/(m^2).    ECOG FS:0 - Asymptomatic  General: Well-developed, well-nourished, no acute distress. Eyes: Pink conjunctiva, anicteric sclera. Breasts: Bilateral breast and axilla without lumps or masses. Lungs: Clear to auscultation bilaterally. Heart: Regular rate and rhythm. No rubs, murmurs, or gallops. Abdomen: Soft, nontender, nondistended. No organomegaly noted, normoactive bowel sounds. Musculoskeletal: No edema, cyanosis, or clubbing. Neuro: Alert, answering all questions appropriately. Cranial nerves grossly intact. Skin: No rashes or petechiae noted. Psych: Normal affect.   LAB RESULTS:  Lab Results  Component Value Date   NA 139 01/25/2012   K 3.6 01/25/2012   CL 105 01/25/2012   CO2 25 01/25/2012   GLUCOSE 148* 01/25/2012   BUN 10 01/25/2012   CREATININE 0.87 01/25/2012   CALCIUM 8.7 01/25/2012   PROT 6.4 01/25/2012   ALBUMIN 3.2* 01/25/2012   AST 20 01/25/2012   ALT 37 01/25/2012   ALKPHOS 91 01/25/2012   BILITOT 0.2 01/25/2012   GFRNONAA >60 01/25/2012   GFRAA >60 01/25/2012    Lab Results  Component Value Date   WBC 6.9 04/09/2012   NEUTROABS 4.7 04/09/2012   HGB 12.6 04/09/2012   HCT 39.2 04/09/2012   MCV 85 04/09/2012   PLT 248 04/09/2012     STUDIES: Dg Bone Density  10/27/2015  EXAM: DUAL X-RAY ABSORPTIOMETRY (DXA) FOR BONE MINERAL DENSITY IMPRESSION: Dear Dr. Grayland Jones, Your patient Brenda Jones completed a BMD test on 10/27/2015 using the [place] (analysis version: 14.10) manufactured by [employer]. The following summarizes the  results of our evaluation. PATIENT BIOGRAPHICAL: Name: Brenda Jones, Brenda Jones Patient ID: [id number] Birth Date: 02-11-1966 Height: 63.0 in. Gender: Female Exam Date: 10/27/2015 Weight: 211.0 lbs. Indications: Breast CA, Height Loss, High Risk Meds, Postmenopausal, Tobacco User(Current Smoker), Tobacco User (Current Smoker) Fractures: Treatments: Arimidex, ASPRIN 81 MG ASSESSMENT: The BMD measured at AP Spine L2-L4 is 0.953 g/cm2 with a T-score of -2.1. This patient is considered osteopenic according to [facility]) criteria. L-1 was excluded due to degenerative changes. Site Region Measured Measured WHO Young Adult BMD Date       Age      Classification T-score AP Spine L2-L4 10/27/2015 50.8 Osteopenia -2.1 0.953 g/cm2 AP Spine L2-L4 10/26/2014 49.8 Osteopenia -2.0 0.970 g/cm2 AP Spine L2-L4 04/21/2013 48.2 Osteopenia -1.3  1.058 g/cm2 DualFemur Neck Right 10/27/2015 50.8 Normal -0.6 0.952 g/cm2 DualFemur Neck Right 10/26/2014 49.8 Normal -0.4 0.976 g/cm2 DualFemur Neck Right 04/21/2013 48.2 Normal -0.3 1.000 g/cm2 World Health Organization [facility]) criteria for post-menopausal, Caucasian Women: Normal:       T-score at or above -1 SD Osteopenia:   T-score between -1 and -2.5 SD Osteoporosis: T-score at or below -2.5 SD RECOMMENDATIONS: [place] recommends that FDA-approved medical therapies be considered in postmenopausal women and men age 36 or older with a: 1. Hip or vertebral (clinical or morphometric) fracture. 2. T-score of < -2.5 at the spine or hip. 3. Ten-year fracture probability by FRAX of 3% or greater for hip fracture or 20% or greater for major osteoporotic fracture. All treatment decisions require clinical judgment and consideration of individual patient factors, including patient preferences, co-morbidities, previous drug use, risk factors not captured in the FRAX model (e.g. falls, vitamin D deficiency, increased bone turnover, interval significant decline in bone  density) and possible under - or over-estimation of fracture risk by FRAX. All patients should ensure an adequate intake of dietary calcium (1200 mg/d) and vitamin D (800 IU daily) unless contraindicated. FOLLOW-UP: People with diagnosed cases of osteoporosis or at high risk for fracture should have regular bone mineral density tests. For patients eligible for Medicare, routine testing is allowed once every 2 years. The testing frequency can be increased to one year for patients who have rapidly progressing disease, those who are receiving or discontinuing medical therapy to restore bone mass, or have additional risk factors. I have reviewed this report, and agree with the above findings. [facility] Radiology Dear Dr. Grayland Jones, Your patient Katryna Tschirhart completed a FRAX assessment on 10/27/2015 using the [place] (analysis version: 14.10) manufactured by [employer]. The following summarizes the results of our evaluation. PATIENT BIOGRAPHICAL: Name: Vernadette, Stutsman Patient ID: [id number] Birth Date: 1966-07-09 Height:    63.0 in. Gender:     Female    [age]        50.8       Weight:    211.0 lbs. Ethnicity:  Black                            Exam Date: 10/27/2015 FRAX* RESULTS:  (version: 3.5) 10-year Probability of Fracture1 Major Osteoporotic Fracture2 Hip Fracture 1.6% 0.1% Population: [place] (Black) Risk Factors: Tobacco User (Current Smoker) Based on Femur (Right) Neck BMD 1 -The 10-year probability of fracture may be lower than reported if the patient has received treatment. 2 -Major Osteoporotic Fracture: Clinical Spine, Forearm, Hip or Shoulder *FRAX is a Materials engineer of the [employer] for Metabolic Bone Disease, a [place] (WHO) [facility]. ASSESSMENT: The probability of a major osteoporotic fracture is 1.6 within the next ten years. The probability of a hip fracture is 0.1 within the next ten years. . Electronically Signed   By:  Lowella Grip III M.D.   On: 10/27/2015 13:40   Mm Diag Breast Tomo Bilateral  10/27/2015  CLINICAL DATA:  Left lumpectomy.  Annual mammography. EXAM: 2D DIGITAL DIAGNOSTIC BILATERAL MAMMOGRAM WITH CAD AND ADJUNCT TOMO COMPARISON:  Previous exam(s). ACR Breast Density Category b: There are scattered areas of fibroglandular density. FINDINGS: The left lumpectomy site is stable. No mammographic evidence of malignancy. Mammographic images were processed with CAD. IMPRESSION: No mammographic evidence of malignancy. RECOMMENDATION: Annual diagnostic mammography. I have discussed the findings and recommendations  with the patient. Results were also provided in writing at the conclusion of the visit. If applicable, a reminder letter will be sent to the patient regarding the next appointment. BI-RADS CATEGORY  2: Benign. 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   PLAN:    1.  Breast cancer: No evidence of disease.  Continue anastrozole completing in September of 2018.  Anastrozole script sent to pharmacy today. Her most recent mammogram on October 27, 2015 was reported as BI-RADS 2. Repeat in April 2018. Return to clinic in 6 months for further evaluation. 2. Osteopenia: Bone mineral density on October 27, 2015 was reported -2.1 and essentially unchanged. Repeat in one year in April 2018.  Continue calcium and vitamin D supplementation.   3. Peripheral neuropathy: Painful. Increase gabapentin to 200 mg morning and noon and 300 mg at bedtime. Script sent to pharmacy today. Previously, patient did not complete the recommended treatment of Taxol secondary to worsening peripheral neuropathy.   Patient expressed understanding and was in agreement with this plan. She also understands that She can call clinic at any time with any questions, concerns, or complaints.    Mayra Reel, NP   11/07/2015  11:30 AM  Patient was seen and evaluated independently and I agree with the assessment and plan as dictated above.  Lloyd Huger, MD 11/07/2015 12:08 PM

## 2015-11-07 NOTE — Progress Notes (Signed)
Patient still has neuropathy in hands and feet that rates 8/10 on pain scale and she is taking Gabapentin 100mg  1 tab during the day then 3 QHS.

## 2015-11-08 ENCOUNTER — Encounter: Payer: Self-pay | Admitting: Family Medicine

## 2015-11-08 ENCOUNTER — Ambulatory Visit (INDEPENDENT_AMBULATORY_CARE_PROVIDER_SITE_OTHER): Payer: 59 | Admitting: Family Medicine

## 2015-11-08 VITALS — BP 118/68 | HR 111 | Temp 98.2°F | Resp 16 | Ht 65.0 in | Wt 199.6 lb

## 2015-11-08 DIAGNOSIS — K219 Gastro-esophageal reflux disease without esophagitis: Secondary | ICD-10-CM

## 2015-11-08 DIAGNOSIS — J302 Other seasonal allergic rhinitis: Secondary | ICD-10-CM | POA: Diagnosis not present

## 2015-11-08 DIAGNOSIS — J449 Chronic obstructive pulmonary disease, unspecified: Secondary | ICD-10-CM | POA: Diagnosis not present

## 2015-11-08 DIAGNOSIS — M62838 Other muscle spasm: Secondary | ICD-10-CM | POA: Diagnosis not present

## 2015-11-08 DIAGNOSIS — G609 Hereditary and idiopathic neuropathy, unspecified: Secondary | ICD-10-CM | POA: Diagnosis not present

## 2015-11-08 DIAGNOSIS — C50912 Malignant neoplasm of unspecified site of left female breast: Secondary | ICD-10-CM

## 2015-11-08 DIAGNOSIS — I10 Essential (primary) hypertension: Secondary | ICD-10-CM | POA: Diagnosis not present

## 2015-11-08 DIAGNOSIS — E8881 Metabolic syndrome: Secondary | ICD-10-CM

## 2015-11-08 DIAGNOSIS — G4700 Insomnia, unspecified: Secondary | ICD-10-CM

## 2015-11-08 DIAGNOSIS — E785 Hyperlipidemia, unspecified: Secondary | ICD-10-CM | POA: Diagnosis not present

## 2015-11-08 DIAGNOSIS — E668 Other obesity: Secondary | ICD-10-CM

## 2015-11-08 DIAGNOSIS — G63 Polyneuropathy in diseases classified elsewhere: Secondary | ICD-10-CM

## 2015-11-08 DIAGNOSIS — IMO0002 Reserved for concepts with insufficient information to code with codable children: Secondary | ICD-10-CM

## 2015-11-08 MED ORDER — HYDROCHLOROTHIAZIDE 12.5 MG PO TABS: 12.5000 mg | ORAL_TABLET | Freq: Every day | ORAL | Status: DC

## 2015-11-08 MED ORDER — HYDROCODONE-ACETAMINOPHEN 5-300 MG PO TABS: 1.0000 | ORAL_TABLET | Freq: Every day | ORAL | Status: DC | PRN

## 2015-11-08 MED ORDER — RANITIDINE HCL 300 MG PO TABS: 300.0000 mg | ORAL_TABLET | Freq: Two times a day (BID) | ORAL | Status: DC

## 2015-11-08 MED ORDER — ZOLPIDEM TARTRATE 10 MG PO TABS: 10.0000 mg | ORAL_TABLET | Freq: Every day | ORAL | Status: DC

## 2015-11-08 MED ORDER — NORTRIPTYLINE HCL 25 MG PO CAPS: 25.0000 mg | ORAL_CAPSULE | Freq: Every day | ORAL | Status: DC

## 2015-11-08 MED ORDER — CYCLOBENZAPRINE HCL 10 MG PO TABS: 10.0000 mg | ORAL_TABLET | Freq: Every day | ORAL | Status: DC

## 2015-11-08 NOTE — Progress Notes (Signed)
Name: Brenda Jones   MRN: [id number]    DOB: 1965-11-03   Date:11/08/2015       Progress Note  Subjective  Chief Complaint  Chief Complaint  Patient presents with  . Medication Refill  . Hypertension    patient is here for her 86-month f/u  . Insomnia    persistent  . COPD    mild  . Dysmetabolic Syndrome  . Hyperlipidemia  . Secondary peripheral neuropathy  . Spasms  . Tobacco Use  . Osteopenia    due to cancer therapy  . Carcinoma of left breast    HPI  HTN: taking bp medication daily, she has been noticing some dizziness- usually after she takes medication in am's, no chest pain, no palpitation, no SOB. BP is towards low end of normal, we decreased dose of Diovan on her last visit but is still having symptoms , we will change from Diovan HCTZ to HCTZ only and monitor. She lost 13 lbs since last visit , stopped drinking sodas and is doing very well.   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 Seen by Dr. Grayland Ormond, currently only calcium plus D, she had repeat bone density in April and is stable, continue supplements  Insomnia: medication helps her fall and stay asleep, but wakes up at 2 am to go to work. She has been doing better about going to be earlier and getting at least 7 hours of sleep   COPD: she is still smoking, now about 3 a day, feeling better, off Spiriva and using Combivent prn. No SOB with activity, denies daily cough, no wheezing. No recent flares.   Muscles Spasms: got much worse with cancer therapy - in 2013 and takes Cyclobenzaprine to control symptoms. It can be on her trunk or legs, she still needs labs, she will go to [facility] to have it drawn, still has a balance with [facility]  Breast cancer left: seeing Dr. Grayland Ormond, s/p lumpectomy still on oral suppressive medication, mammogram is up to date  Peripheral Neuropathy: still has daily pain on both legs and feet also on left arm. She takes Pamelor at night, but also takes hydrodocodone prn for pain, recently  started on Gabapentin by the cancer center, but has noticed an improvement of symptoms yet. . The pain is secondary to left breast lumpectomy - causes left arm soreness and also chemotherapy. She states pain is a 7-8/10 average  Obesity: she decreased  drinking sodas, used to drink 5-6 daily and is down to 1 daily, and has lost 13 lbs. She has also decreased portion size, trying to lose weight and is doing well .   Patient Active Problem List   Diagnosis Date Noted  . Osteopenia due to cancer therapy 04/18/2015  . GERD (gastroesophageal reflux disease) 04/18/2015  . Muscle spasm 04/18/2015  . Benign neoplasm of descending colon   . Rectal polyp   . Cervical high risk HPV (human papillomavirus) test positive 03/22/2015  . Benign essential HTN 01/08/2015  . Insomnia, persistent 01/08/2015  . COPD, mild ([place]) 01/08/2015  . HLD (hyperlipidemia) 01/08/2015  . Dysmetabolic syndrome [id number]  . Adult BMI 30+ 01/08/2015  . Seasonal allergic rhinitis 01/08/2015  . Secondary peripheral neuropathy 01/08/2015  . Arthralgia of multiple joints 01/08/2015  . Breast carcinoma ([place]) 02/21/2011    Past Surgical History  Procedure Laterality Date  . Bladder surgery  2007  . Breast surgery    . Breast lumpectomy  2013  . Colonoscopy with propofol N/A 04/04/2015  Procedure: COLONOSCOPY WITH PROPOFOL;  Surgeon: [name], MD;  Location: [facility];  Service: Endoscopy;  Laterality: N/A;  . Polypectomy  04/04/2015    Procedure: POLYPECTOMY;  Surgeon: [name], MD;  Location: [place];  Service: Endoscopy;;    Family History  Problem Relation Age of Onset  . Heart disease Mother   . Diabetes Father   . Hypertension Father     Social History   Social History  . Marital Status: Single    Spouse Name: N/A  . Number of Children: N/A  . Years of Education: N/A   Occupational History  . Not on file.   Social History Main Topics  . Smoking status: Current Every Day  Smoker -- 0.25 packs/day for 35 years    Types: Cigarettes  . Smokeless tobacco: Not on file  . Alcohol Use: No  . Drug Use: No  . Sexual Activity: No   Other Topics Concern  . Not on file   Social History Narrative     Current outpatient prescriptions:  .  anastrozole (ARIMIDEX) 1 MG tablet, Take 1 tablet (1 mg total) by mouth daily., Disp: 90 tablet, Rfl: 1 .  aspirin 81 MG tablet, Take 1 tablet by mouth daily., Disp: , Rfl:  .  cyclobenzaprine (FLEXERIL) 10 MG tablet, Take 1 tablet (10 mg total) by mouth at bedtime., Disp: 30 tablet, Rfl: 2 .  fluticasone (FLONASE) 50 MCG/ACT nasal spray, Place 2 sprays into the nose as needed., Disp: , Rfl:  .  gabapentin (NEURONTIN) 100 MG capsule, Take 1 capsule (100 mg total) by mouth 3 (three) times daily. 200mg  morning and noon, 300 mg at bedtime - may increase to 300 mg TID PRN, Disp: 90 capsule, Rfl: 1 .  Hydrocodone-Acetaminophen (VICODIN) 5-300 MG TABS, Take 1 tablet by mouth daily as needed., Disp: 30 each, Rfl: 0 .  Ipratropium-Albuterol (COMBIVENT RESPIMAT) 20-100 MCG/ACT AERS respimat, Inhale 1 puff into the lungs as needed., Disp: , Rfl:  .  loratadine (CLARITIN) 10 MG tablet, Take 1 tablet (10 mg total) by mouth as needed for allergies., Disp: 30 tablet, Rfl: 2 .  nortriptyline (PAMELOR) 25 MG capsule, Take 1 capsule (25 mg total) by mouth daily., Disp: 30 capsule, Rfl: 2 .  ranitidine (ZANTAC) 75 MG tablet, Take 4 tablets (300 mg total) by mouth as needed for heartburn., Disp: 30 tablet, Rfl: 2 .  tiotropium (SPIRIVA HANDIHALER) 18 MCG inhalation capsule, Place 1 puff into inhaler and inhale as needed., Disp: , Rfl:  .  zolpidem (AMBIEN) 10 MG tablet, Take 1 tablet (10 mg total) by mouth at bedtime., Disp: 30 tablet, Rfl: 2 .  hydrochlorothiazide (HYDRODIURIL) 12.5 MG tablet, Take 1 tablet (12.5 mg total) by mouth daily., Disp: 30 tablet, Rfl: 2  Allergies  Allergen Reactions  . Ace Inhibitors Cough     ROS  Constitutional:  Negative for fever, positive  weight change.  Respiratory: Negative for cough and shortness of breath.   Cardiovascular: Negative for chest pain or palpitations.  Gastrointestinal: Negative for abdominal pain, no bowel changes.  Musculoskeletal: Negative for gait problem or joint swelling.  Skin: Negative for rash.  Neurological: Positive  for dizziness ( occasionally ) but has no headache.  No other specific complaints in a complete review of systems (except as listed in HPI above).  Objective  Filed Vitals:   11/08/15 1102  BP: 118/68  Pulse: 111  Temp: 98.2 F (36.8 C)  TempSrc: Oral  Resp: 16  Height: 5\' 5"  (1.651 m)  Weight: 199 lb 9.6 oz (90.538 kg)  SpO2: 98%    Body mass index is 33.22 kg/(m^2).  Physical Exam  Constitutional: Patient appears well-developed and well-nourished. Obese No distress.  HEENT: head atraumatic, normocephalic, pupils equal and reactive to light,  neck supple, throat within normal limits Cardiovascular: Normal rate, regular rhythm and normal heart sounds.  No murmur heard. No BLE edema. Pulmonary/Chest: Effort normal and breath sounds normal. No respiratory distress. Abdominal: Soft.  There is no tenderness. Psychiatric: Patient has a normal mood and affect. behavior is normal. Judgment and thought content normal.  PHQ2/9: Depression screen [facility] 2/9 11/08/2015 08/04/2015 03/17/2015  Decreased Interest 0 0 0  Down, Depressed, Hopeless 0 0 0  PHQ - 2 Score 0 0 0     Fall Risk: Fall Risk  11/08/2015 08/04/2015 03/17/2015  Falls in the past year? No No No     Functional Status Survey: Is the patient deaf or have difficulty hearing?: No Does the patient have difficulty seeing, even when wearing glasses/contacts?: No Does the patient have difficulty concentrating, remembering, or making decisions?: No Does the patient have difficulty walking or climbing stairs?: No Does the patient have difficulty dressing or bathing?: No Does the patient have  difficulty doing errands alone such as visiting a doctor's office or shopping?: No    Assessment & Plan  1. Benign essential HTN  Decrease medication, stop Diovan, continue HCTZ only and monitor bp  - hydrochlorothiazide (HYDRODIURIL) 12.5 MG tablet; Take 1 tablet (12.5 mg total) by mouth daily.  Dispense: 30 tablet; Refill: 2  2. Carcinoma of left breast ([place])  Up to date with follow up with cancer center  3. Insomnia, persistent  - zolpidem (AMBIEN) 10 MG tablet; Take 1 tablet (10 mg total) by mouth at bedtime.  Dispense: 30 tablet; Refill: 2  4. COPD, mild ([place])  Discussed importance of quitting smoking again   5. Dysmetabolic syndrome  Changed diet and is doing well, losing weight   6. HLD (hyperlipidemia)  Lipid panel shows low HDL : to improve HDL patient  needs to eat tree nuts ( pecans/pistachios/almonds ) four times weekly, eat fish two times weekly  and exercise  at least 150 minutes per week  7. Secondary peripheral neuropathy  - Hydrocodone-Acetaminophen (VICODIN) 5-300 MG TABS; Take 1 tablet by mouth daily as needed.  Dispense: 30 each; Refill: 0 - nortriptyline (PAMELOR) 25 MG capsule; Take 1 capsule (25 mg total) by mouth daily.  Dispense: 30 capsule; Refill: 2  8. Seasonal allergic rhinitis  Stable at this time  9. Gastroesophageal reflux disease without esophagitis  Continue Ranitidine  10. Muscle spasm  - cyclobenzaprine (FLEXERIL) 10 MG tablet; Take 1 tablet (10 mg total) by mouth at bedtime.  Dispense: 30 tablet; Refill: 2  11. Adult BMI 30+  Doing well with weight loss

## 2015-11-14 ENCOUNTER — Other Ambulatory Visit: Payer: Self-pay | Admitting: Family Medicine

## 2015-11-14 NOTE — Telephone Encounter (Signed)
Patient requesting refill. 

## 2016-01-19 ENCOUNTER — Other Ambulatory Visit: Payer: Self-pay

## 2016-01-19 DIAGNOSIS — G4700 Insomnia, unspecified: Secondary | ICD-10-CM

## 2016-01-19 MED ORDER — ZOLPIDEM TARTRATE 10 MG PO TABS: 10.0000 mg | ORAL_TABLET | Freq: Every day | ORAL | Status: DC

## 2016-01-19 NOTE — Telephone Encounter (Signed)
Patient requesting refill. 

## 2016-02-07 ENCOUNTER — Ambulatory Visit: Payer: 59 | Admitting: Family Medicine

## 2016-02-24 ENCOUNTER — Encounter: Payer: Self-pay | Admitting: Family Medicine

## 2016-02-24 ENCOUNTER — Ambulatory Visit (INDEPENDENT_AMBULATORY_CARE_PROVIDER_SITE_OTHER): Payer: 59 | Admitting: Family Medicine

## 2016-02-24 ENCOUNTER — Telehealth: Payer: Self-pay

## 2016-02-24 VITALS — BP 126/74 | HR 114 | Temp 97.9°F | Resp 18 | Ht 65.0 in | Wt 202.8 lb

## 2016-02-24 DIAGNOSIS — J449 Chronic obstructive pulmonary disease, unspecified: Secondary | ICD-10-CM

## 2016-02-24 DIAGNOSIS — K219 Gastro-esophageal reflux disease without esophagitis: Secondary | ICD-10-CM | POA: Diagnosis not present

## 2016-02-24 DIAGNOSIS — I10 Essential (primary) hypertension: Secondary | ICD-10-CM | POA: Diagnosis not present

## 2016-02-24 DIAGNOSIS — C50912 Malignant neoplasm of unspecified site of left female breast: Secondary | ICD-10-CM | POA: Diagnosis not present

## 2016-02-24 DIAGNOSIS — E785 Hyperlipidemia, unspecified: Secondary | ICD-10-CM | POA: Diagnosis not present

## 2016-02-24 DIAGNOSIS — Z79899 Other long term (current) drug therapy: Secondary | ICD-10-CM | POA: Diagnosis not present

## 2016-02-24 DIAGNOSIS — E8881 Metabolic syndrome: Secondary | ICD-10-CM | POA: Diagnosis not present

## 2016-02-24 DIAGNOSIS — IMO0002 Reserved for concepts with insufficient information to code with codable children: Secondary | ICD-10-CM

## 2016-02-24 DIAGNOSIS — R000 Tachycardia, unspecified: Secondary | ICD-10-CM

## 2016-02-24 DIAGNOSIS — G609 Hereditary and idiopathic neuropathy, unspecified: Secondary | ICD-10-CM

## 2016-02-24 DIAGNOSIS — E668 Other obesity: Secondary | ICD-10-CM

## 2016-02-24 DIAGNOSIS — M62838 Other muscle spasm: Secondary | ICD-10-CM

## 2016-02-24 DIAGNOSIS — G63 Polyneuropathy in diseases classified elsewhere: Secondary | ICD-10-CM

## 2016-02-24 DIAGNOSIS — G4700 Insomnia, unspecified: Secondary | ICD-10-CM

## 2016-02-24 DIAGNOSIS — Z114 Encounter for screening for human immunodeficiency virus [HIV]: Secondary | ICD-10-CM

## 2016-02-24 MED ORDER — HYDROCODONE-ACETAMINOPHEN 5-300 MG PO TABS: 1.0000 | ORAL_TABLET | Freq: Every day | ORAL | 0 refills | Status: DC | PRN

## 2016-02-24 MED ORDER — CYCLOBENZAPRINE HCL 10 MG PO TABS: 5.0000 mg | ORAL_TABLET | Freq: Every day | ORAL | 2 refills | Status: DC

## 2016-02-24 MED ORDER — GABAPENTIN 300 MG PO CAPS: 300.0000 mg | ORAL_CAPSULE | Freq: Every day | ORAL | 2 refills | Status: DC

## 2016-02-24 MED ORDER — ZOLPIDEM TARTRATE 10 MG PO TABS: 5.0000 mg | ORAL_TABLET | Freq: Every day | ORAL | 2 refills | Status: DC

## 2016-02-24 MED ORDER — HYDROCHLOROTHIAZIDE 12.5 MG PO TABS: 12.5000 mg | ORAL_TABLET | Freq: Every day | ORAL | 2 refills | Status: DC

## 2016-02-24 NOTE — Progress Notes (Signed)
Name: Brenda Jones   MRN: [id number]    DOB: 07/02/1966   Date:02/24/2016       Progress Note  Subjective  Chief Complaint  Chief Complaint  Patient presents with  . Medication Refill    3 month F/U   . Hypertension    Bilateral edema in feet and ankle but feels like it stems from her chemotherapy  . Insomnia    Patient states medication makes her fall asleep in a hour, but has been out of it for the past 3 days and has not been able to sleep  . COPD    mild-well controlled  . Spasms    Unchanged even with medication    HPI  HTN: taking bp medication daily, she has been noticing some dizziness- usually after she takes medication in am's, no chest pain, no palpitation, no SOB. We stopped Diovan, only on HCTZ past few months but still has some dizziness - usually when she stoops. We will try decreasing to half pill of HCTZ until she returns.   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 Seen by Dr. Grayland Ormond, currently only calcium plus D, she had repeat bone density in April and is stable, continue supplements. We will check vitamin D level   Insomnia: medication helps her fall and stay asleep, but wakes up at 2 am to go to work. She has been doing better about going to be earlier and getting at least 7 hours of sleep. Discussed FDA and the need to try going down to 5 mg qhs and she will try.   COPD: she is still smoking, now about 2-3 a day, but also uses e-cigarettes. Feeling better, off Spiriva and using Combivent prn. No SOB with activity, denies daily cough, no wheezing.   Muscles Spasms: got much worse with cancer therapy - in 2013 and takes Cyclobenzaprine to control symptoms. It can be on her trunk or legs, she still needs labs, she will have labs done with [facility], still has a balance with [facility]   Breast cancer left: seeing Dr. Grayland Ormond, s/p lumpectomy still on oral suppressive medication, mammogram is up to date. Still taking Anastrozole.   Peripheral Neuropathy:  still has daily pain on both legs and feet also on left arm. She takes Pamelor at night, but also takes hydrodocodone prn for pain, recently started on Gabapentin by the cancer center, but has noticed an improvement of symptoms yet, she is on 300 mg qhs, but unable to take it before work. Discussed risk of respiratory suppression with all this medications before sleep. So we will stop Pamelor, try to stop Hydrocodone, I will give her only 10 pills, and also take half of Flexeril and half dose of Ambien. . The arm pain is secondary to left breast lumpectomy - causes left arm soreness and also chemotherapy. She states pain right now is 7 /10 average  Obesity: she decreased  drinking sodas, used to drink 5-6 daily and is down to 1 daily,she has lost 13 lbs but is up 2 lbs since last visit. She states she has been feeling hungry at night. We will check for DM  Patient Active Problem List   Diagnosis Date Noted  . Osteopenia due to cancer therapy 04/18/2015  . GERD (gastroesophageal reflux disease) 04/18/2015  . Muscle spasm 04/18/2015  . Benign neoplasm of descending colon   . Rectal polyp   . Cervical high risk HPV (human papillomavirus) test positive 03/22/2015  . Benign essential HTN 01/08/2015  . Insomnia, persistent  01/08/2015  . COPD, mild ([place]) 01/08/2015  . HLD (hyperlipidemia) 01/08/2015  . Dysmetabolic syndrome [id number]  . Adult BMI 30+ 01/08/2015  . Seasonal allergic rhinitis 01/08/2015  . Secondary peripheral neuropathy 01/08/2015  . Arthralgia of multiple joints 01/08/2015  . Breast carcinoma ([place]) 02/21/2011    Past Surgical History:  Procedure Laterality Date  . BLADDER SURGERY  2007  . BREAST LUMPECTOMY  2013  . BREAST SURGERY    . COLONOSCOPY WITH PROPOFOL N/A 04/04/2015   Procedure: COLONOSCOPY WITH PROPOFOL;  Surgeon: [name], MD;  Location: [place];  Service: Endoscopy;  Laterality: N/A;  . POLYPECTOMY  04/04/2015   Procedure: POLYPECTOMY;  Surgeon:  [name], MD;  Location: [place];  Service: Endoscopy;;    Family History  Problem Relation Age of Onset  . Heart disease Mother   . Diabetes Father   . Hypertension Father     Social History   Social History  . Marital status: Single    Spouse name: N/A  . Number of children: N/A  . Years of education: N/A   Occupational History  . Not on file.   Social History Main Topics  . Smoking status: Current Every Day Smoker    Packs/day: 0.25    Years: 35.00    Types: Cigarettes, E-cigarettes  . Smokeless tobacco: Never Used  . Alcohol use No  . Drug use: No  . Sexual activity: No   Other Topics Concern  . Not on file   Social History Narrative  . No narrative on file     Current Outpatient Prescriptions:  .  anastrozole (ARIMIDEX) 1 MG tablet, Take 1 tablet (1 mg total) by mouth daily., Disp: 90 tablet, Rfl: 1 .  aspirin 81 MG tablet, Take 1 tablet by mouth daily., Disp: , Rfl:  .  cyclobenzaprine (FLEXERIL) 10 MG tablet, Take 0.5-1 tablets (5-10 mg total) by mouth at bedtime., Disp: 30 tablet, Rfl: 2 .  fluticasone (FLONASE) 50 MCG/ACT nasal spray, Place 2 sprays into the nose as needed., Disp: , Rfl:  .  gabapentin (NEURONTIN) 300 MG capsule, Take 1 capsule (300 mg total) by mouth at bedtime. 200mg  morning and noon, 300 mg at bedtime - may increase to 300 mg TID PRN, Disp: 30 capsule, Rfl: 2 .  hydrochlorothiazide (HYDRODIURIL) 12.5 MG tablet, Take 1 tablet (12.5 mg total) by mouth daily., Disp: 30 tablet, Rfl: 2 .  Hydrocodone-Acetaminophen (VICODIN) 5-300 MG TABS, Take 1 tablet by mouth daily as needed., Disp: 10 each, Rfl: 0 .  Ipratropium-Albuterol (COMBIVENT RESPIMAT) 20-100 MCG/ACT AERS respimat, Inhale 1 puff into the lungs as needed., Disp: , Rfl:  .  loratadine (CLARITIN) 10 MG tablet, Take 1 tablet (10 mg total) by mouth as needed for allergies., Disp: 30 tablet, Rfl: 2 .  ranitidine (ZANTAC) 300 MG tablet, Take 1 tablet (300 mg total) by mouth 2  (two) times daily., Disp: 60 tablet, Rfl: 3 .  zolpidem (AMBIEN) 10 MG tablet, Take 0.5-1 tablets (5-10 mg total) by mouth at bedtime., Disp: 30 tablet, Rfl: 2  Allergies  Allergen Reactions  . Ace Inhibitors Cough     ROS  Ten systems reviewed and is negative except as mentioned in HPI  Objective  Vitals:   02/24/16 0934  BP: 126/74  Pulse: (!) 114  Resp: 18  Temp: 97.9 F (36.6 C)  TempSrc: Oral  SpO2: 98%  Weight: 202 lb 12.8 oz (92 kg)  Height: 5\' 5"  (1.651 m)  Body mass index is 33.75 kg/m.  Physical Exam  Constitutional: Patient appears well-developed and well-nourished. Obese  No distress.  HEENT: head atraumatic, normocephalic, pupils equal and reactive to light, neck supple, throat within normal limits Cardiovascular: Normal rate, regular rhythm and normal heart sounds.  No murmur heard. No BLE edema. Pulmonary/Chest: Effort normal and breath sounds normal. No respiratory distress. Abdominal: Soft.  There is no tenderness. Psychiatric: Patient has a normal mood and affect. behavior is normal. Judgment and thought content normal.  PHQ2/9: Depression screen [facility] 2/9 02/24/2016 11/08/2015 08/04/2015 03/17/2015  Decreased Interest 0 0 0 0  Down, Depressed, Hopeless 0 0 0 0  PHQ - 2 Score 0 0 0 0    Fall Risk: Fall Risk  02/24/2016 11/08/2015 08/04/2015 03/17/2015  Falls in the past year? No No No No    Functional Status Survey: Is the patient deaf or have difficulty hearing?: No Does the patient have difficulty seeing, even when wearing glasses/contacts?: No Does the patient have difficulty concentrating, remembering, or making decisions?: No Does the patient have difficulty walking or climbing stairs?: No Does the patient have difficulty dressing or bathing?: No Does the patient have difficulty doing errands alone such as visiting a doctor's office or shopping?: No    Assessment & Plan  1. Benign essential HTN  - hydrochlorothiazide (HYDRODIURIL) 12.5 MG  tablet; Take 1 tablet (12.5 mg total) by mouth daily.  Dispense: 30 tablet; Refill: 2 - CBC with Differential/Platelet  2. Carcinoma of left breast ([place])  Continue follow up with Dr. Grayland Ormond  3. COPD, mild (HCC)  Stable, needs to quit smoking  4. Dysmetabolic syndrome  - Hemoglobin A1c  5. HLD (hyperlipidemia)  - Lipid panel  6. Secondary peripheral neuropathy  - Hydrocodone-Acetaminophen (VICODIN) 5-300 MG TABS; Take 1 tablet by mouth daily as needed.  Dispense: 10 each; Refill: 0 - gabapentin (NEURONTIN) 300 MG capsule; Take 1 capsule (300 mg total) by mouth at bedtime. 200mg  morning and noon, 300 mg at bedtime - may increase to 300 mg TID PRN  Dispense: 30 capsule; Refill: 2 - Vitamin B12  7. Gastroesophageal reflux disease without esophagitis  Controlled with medication, down to one daily   8. Muscle spasm  - cyclobenzaprine (FLEXERIL) 10 MG tablet; Take 0.5-1 tablets (5-10 mg total) by mouth at bedtime.  Dispense: 30 tablet; Refill: 2 - VITAMIN D 25 Hydroxy (Vit-D Deficiency, Fractures) - Vitamin B12  9. Adult BMI 30+  Discussed with the patient the risk posed by an increased BMI. Discussed importance of portion control, calorie counting and at least 150 minutes of physical activity weekly. Avoid sweet beverages and drink more water. Eat at least 6 servings of fruit and vegetables daily   10. Screening for HIV (human immunodeficiency virus)  - HIV antibody  11. Insomnia, persistent  - zolpidem (AMBIEN) 10 MG tablet; Take 0.5-1 tablets (5-10 mg total) by mouth at bedtime.  Dispense: 30 tablet; Refill: 2  12. Long-term use of high-risk medication  - COMPLETE METABOLIC PANEL WITH GFR  14. Tachycardia  - TSH

## 2016-02-24 NOTE — Telephone Encounter (Signed)
Patient states all her medications were sent to her pharmacy except her hormone medication, can you please send to her pharmacy. Thanks

## 2016-02-26 ENCOUNTER — Other Ambulatory Visit: Payer: Self-pay | Admitting: Family Medicine

## 2016-02-26 NOTE — Telephone Encounter (Signed)
Anastrozole is prescribed by oncologist. I can't call that in

## 2016-02-27 NOTE — Telephone Encounter (Signed)
Patient notified and will request from Oncologist.

## 2016-03-19 ENCOUNTER — Other Ambulatory Visit: Payer: Self-pay | Admitting: Family Medicine

## 2016-03-19 ENCOUNTER — Encounter: Payer: Self-pay | Admitting: Family Medicine

## 2016-03-19 ENCOUNTER — Ambulatory Visit (INDEPENDENT_AMBULATORY_CARE_PROVIDER_SITE_OTHER): Payer: 59 | Admitting: Family Medicine

## 2016-03-19 VITALS — BP 128/74 | HR 92 | Temp 98.3°F | Ht 64.0 in | Wt 204.0 lb

## 2016-03-19 DIAGNOSIS — Z23 Encounter for immunization: Secondary | ICD-10-CM | POA: Diagnosis not present

## 2016-03-19 DIAGNOSIS — Z0000 Encounter for general adult medical examination without abnormal findings: Secondary | ICD-10-CM

## 2016-03-19 DIAGNOSIS — R87810 Cervical high risk human papillomavirus (HPV) DNA test positive: Secondary | ICD-10-CM | POA: Diagnosis not present

## 2016-03-19 DIAGNOSIS — C50912 Malignant neoplasm of unspecified site of left female breast: Secondary | ICD-10-CM | POA: Diagnosis not present

## 2016-03-19 DIAGNOSIS — Z01419 Encounter for gynecological examination (general) (routine) without abnormal findings: Secondary | ICD-10-CM

## 2016-03-19 LAB — COMPLETE METABOLIC PANEL WITH GFR
ALT: 15 U/L (ref 6–29)
AST: 16 U/L (ref 10–35)
Albumin: 4.2 g/dL (ref 3.6–5.1)
Alkaline Phosphatase: 111 U/L (ref 33–130)
BUN: 12 mg/dL (ref 7–25)
CO2: 26 mmol/L (ref 20–31)
Calcium: 9.6 mg/dL (ref 8.6–10.4)
Chloride: 107 mmol/L (ref 98–110)
Creat: 0.76 mg/dL (ref 0.50–1.05)
GFR, Est African American: >89 mL/min (ref >=60)
GLUCOSE: 94 mg/dL (ref 65–99)
POTASSIUM: 4.1 mmol/L (ref 3.5–5.3)
SODIUM: 140 mmol/L (ref 135–146)
Total Bilirubin: 0.3 mg/dL (ref 0.2–1.2)
Total Protein: 7.1 g/dL (ref 6.1–8.1)

## 2016-03-19 LAB — CBC WITH DIFFERENTIAL/PLATELET
BASOS PCT: 0 %
Basophils Absolute: 0 cells/uL (ref 0–200)
Eosinophils Absolute: 85 cells/uL (ref 15–500)
Eosinophils Relative: 1 %
HEMATOCRIT: 39.1 % (ref 35.0–45.0)
Hemoglobin: 13.1 g/dL (ref 11.7–15.5)
LYMPHS PCT: 34 %
Lymphs Abs: 2890 cells/uL (ref 850–3900)
MCH: 27.3 pg (ref 27.0–33.0)
MCHC: 33.5 g/dL (ref 32.0–36.0)
MCV: 81.6 fL (ref 80.0–100.0)
MONO ABS: 510 {cells}/uL (ref 200–950)
MONOS PCT: 6 %
MPV: 11.2 fL (ref 7.5–12.5)
NEUTROS ABS: 5015 {cells}/uL (ref 1500–7800)
Neutrophils Relative %: 59 %
PLATELETS: 324 10*3/uL (ref 140–400)
RBC: 4.79 MIL/uL (ref 3.80–5.10)
RDW: 15.4 % — AB (ref 11.0–15.0)
WBC: 8.5 10*3/uL (ref 3.8–10.8)

## 2016-03-19 LAB — LIPID PANEL
CHOLESTEROL: 133 mg/dL (ref 125–200)
HDL: 49 mg/dL (ref >=46)
LDL CALC: 69 mg/dL (ref <130)
Total CHOL/HDL Ratio: 2.7 Ratio (ref <=5.0)
Triglycerides: 73 mg/dL (ref <150)
VLDL: 15 mg/dL (ref <30)

## 2016-03-19 LAB — HIV ANTIBODY (ROUTINE TESTING W REFLEX): HIV 1&2 Ab, 4th Generation: NONREACTIVE

## 2016-03-19 LAB — HEMOGLOBIN A1C
Hgb A1c MFr Bld: 5.8 % — ABNORMAL HIGH (ref <5.7)
MEAN PLASMA GLUCOSE: 120 mg/dL

## 2016-03-19 NOTE — Progress Notes (Signed)
Name: Brenda Jones   MRN: [id number]    DOB: [birth year]   Date:03/19/2016       Progress Note  Subjective  Chief Complaint  Chief Complaint  Patient presents with  . Annual Exam    HPI  Well Woman; she is not dating and has not been sexually active for almost 4 years, she stopped having intercourse after she had lumpectomy. She is up to date with colonoscopy and also mammogram, last pap was high risk HPV and we will recheck it today. She is also due for labs. She refused flu shot today. She denies urinary symptoms   Patient Active Problem List   Diagnosis Date Noted  . Osteopenia due to cancer therapy 04/18/2015  . GERD (gastroesophageal reflux disease) 04/18/2015  . Muscle spasm 04/18/2015  . Benign neoplasm of descending colon   . Rectal polyp   . Cervical high risk HPV (human papillomavirus) test positive 03/22/2015  . Benign essential HTN 01/08/2015  . Insomnia, persistent 01/08/2015  . COPD, mild ([place]) 01/08/2015  . HLD (hyperlipidemia) 01/08/2015  . Dysmetabolic syndrome [id number]  . Adult BMI 30+ 01/08/2015  . Seasonal allergic rhinitis 01/08/2015  . Secondary peripheral neuropathy 01/08/2015  . Arthralgia of multiple joints 01/08/2015  . Breast carcinoma ([place]) 02/21/2011    Past Surgical History:  Procedure Laterality Date  . BLADDER SURGERY  2007  . BREAST LUMPECTOMY  2013  . BREAST SURGERY    . COLONOSCOPY WITH PROPOFOL N/A 04/04/2015   Procedure: COLONOSCOPY WITH PROPOFOL;  Surgeon: [name], MD;  Location: [place];  Service: Endoscopy;  Laterality: N/A;  . POLYPECTOMY  04/04/2015   Procedure: POLYPECTOMY;  Surgeon: [name], MD;  Location: [place];  Service: Endoscopy;;    Family History  Problem Relation Age of Onset  . Heart disease Mother   . Diabetes Father   . Hypertension Father     Social History   Social History  . Marital status: Single    Spouse name: N/A  . Number of children: N/A  . Years of education:  N/A   Occupational History  . Not on file.   Social History Main Topics  . Smoking status: Current Every Day Smoker    Packs/day: 0.25    Years: 35.00    Types: Cigarettes, E-cigarettes  . Smokeless tobacco: Never Used  . Alcohol use No  . Drug use: No  . Sexual activity: No   Other Topics Concern  . Not on file   Social History Narrative  . No narrative on file     Current Outpatient Prescriptions:  .  anastrozole (ARIMIDEX) 1 MG tablet, Take 1 tablet (1 mg total) by mouth daily., Disp: 90 tablet, Rfl: 1 .  aspirin 81 MG tablet, Take 1 tablet by mouth daily., Disp: , Rfl:  .  cyclobenzaprine (FLEXERIL) 10 MG tablet, Take 0.5-1 tablets (5-10 mg total) by mouth at bedtime., Disp: 30 tablet, Rfl: 2 .  fluticasone (FLONASE) 50 MCG/ACT nasal spray, Place 2 sprays into the nose as needed., Disp: , Rfl:  .  gabapentin (NEURONTIN) 300 MG capsule, Take 1 capsule (300 mg total) by mouth at bedtime. 200mg  morning and noon, 300 mg at bedtime - may increase to 300 mg TID PRN, Disp: 30 capsule, Rfl: 2 .  hydrochlorothiazide (HYDRODIURIL) 12.5 MG tablet, Take 1 tablet (12.5 mg total) by mouth daily., Disp: 30 tablet, Rfl: 2 .  Hydrocodone-Acetaminophen (VICODIN) 5-300 MG TABS, Take 1 tablet by mouth daily as needed., Disp:  10 each, Rfl: 0 .  Ipratropium-Albuterol (COMBIVENT RESPIMAT) 20-100 MCG/ACT AERS respimat, Inhale 1 puff into the lungs as needed., Disp: , Rfl:  .  loratadine (CLARITIN) 10 MG tablet, Take 1 tablet (10 mg total) by mouth as needed for allergies., Disp: 30 tablet, Rfl: 2 .  ranitidine (ZANTAC) 300 MG tablet, Take 1 tablet (300 mg total) by mouth 2 (two) times daily., Disp: 60 tablet, Rfl: 3 .  zolpidem (AMBIEN) 10 MG tablet, Take 0.5-1 tablets (5-10 mg total) by mouth at bedtime., Disp: 30 tablet, Rfl: 2  Allergies  Allergen Reactions  . Ace Inhibitors Cough     ROS  Constitutional: Negative for fever or weight change.  Respiratory: Negative for cough and shortness  of breath.   Cardiovascular: Negative for chest pain or palpitations.  Gastrointestinal: Negative for abdominal pain, no bowel changes.  Musculoskeletal: Positive  for gait problem ( secondary to feet pain ) or joint swelling.  Skin: Negative for rash.  Neurological: Negative for dizziness, positive for occasional  headache.  No other specific complaints in a complete review of systems (except as listed in HPI above).  Objective  Vitals:   03/19/16 0836  BP: 128/74  Pulse: 92  Temp: 98.3 F (36.8 C)  TempSrc: Oral  SpO2: 97%  Weight: 204 lb (92.5 kg)  Height: 5\' 4"  (1.626 m)    Body mass index is 35.02 kg/m.  Physical Exam  Constitutional: Patient appears well-developed and obese. No distress.  HENT: Head: Normocephalic and atraumatic. Ears: B TMs ok, no erythema or effusion; Nose: Nose normal. Mouth/Throat: Oropharynx is clear and moist. No oropharyngeal exudate.  Eyes: Conjunctivae and EOM are normal. Pupils are equal, round, and reactive to light. No scleral icterus.  Neck: Normal range of motion. Neck supple. No JVD present. No thyromegaly present.  Cardiovascular: Normal rate, regular rhythm and normal heart sounds.  No murmur heard. No BLE edema. Pulmonary/Chest: Effort normal and breath sounds normal. No respiratory distress. Abdominal: Soft. Bowel sounds are normal, no distension. There is no tenderness. no masses Breast: no lumps or masses, no nipple discharge or rashes FEMALE GENITALIA:  External genitalia normal External urethra normal Vaginal vault normal without discharge or lesions Cervix normal without discharge or lesions Bimanual exam normal without masses RECTAL: no rectal masses or hemorrhoids Musculoskeletal: Normal range of motion, no joint effusions. No gross deformities Neurological: he is alert and oriented to person, place, and time. No cranial nerve deficit. Coordination, balance, strength, speech and gait are normal.  Skin: Skin is warm and dry.  No rash noted. No erythema.  Psychiatric: Patient has a normal mood and affect. behavior is normal. Judgment and thought content normal.  PHQ2/9: Depression screen [facility] 2/9 02/24/2016 11/08/2015 08/04/2015 03/17/2015  Decreased Interest 0 0 0 0  Down, Depressed, Hopeless 0 0 0 0  PHQ - 2 Score 0 0 0 0     Fall Risk: Fall Risk  02/24/2016 11/08/2015 08/04/2015 03/17/2015  Falls in the past year? No No No No     Assessment & Plan  1. Well woman exam  Discussed importance of 150 minutes of physical activity weekly, eat two servings of fish weekly, eat one serving of tree nuts ( cashews, pistachios, pecans, almonds.Marland Kitchen) every other day, eat 6 servings of fruit/vegetables daily and drink plenty of water and avoid sweet beverages.  - Lipid panel - Hemoglobin A1c - COMPLETE METABOLIC PANEL WITH GFR - CBC with Differential/Platelet - HIV antibody - VITAMIN D 25 Hydroxy (Vit-D Deficiency,  Fractures)  2. Cervical high risk HPV (human papillomavirus) test positive  - PapLb, HPV, rfx16/18  3. Carcinoma of left breast ([place])  Keep follow up with Dr. Grayland Ormond. Surgery was done by Dr. Marina Gravel

## 2016-03-20 LAB — VITAMIN D 25 HYDROXY (VIT D DEFICIENCY, FRACTURES): VIT D 25 HYDROXY: 34 ng/mL (ref 30–100)

## 2016-03-22 LAB — PAPLB, HPV, RFX16/18
HPV, HIGH-RISK: NEGATIVE
PAP SMEAR COMMENT: 0

## 2016-05-07 NOTE — Progress Notes (Signed)
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  INTERVAL HISTORY: Patient returns to clinic today for routine 6 month evaluation.  She continues to have peripheral neuropathy that is unchanged.  She is tolerating anastrozole well without significant side effects. She does admit to occasional hot flashes that are tolerable. She has no other neurologic complaints. She has had no recent fevers or illnesses. She has a good appetite and denies weight loss. She has no chest pain or shortness of breath. She denies any nausea, vomiting, constipation, or diarrhea. She has no urinary complaints. Patient offers no further specific complaints today.  REVIEW OF SYSTEMS:   Review of Systems  Constitutional: Negative.  Negative for fever, malaise/fatigue and weight loss.  Respiratory: Negative.  Negative for cough and shortness of breath.   Cardiovascular: Negative.  Negative for chest pain and leg swelling.  Gastrointestinal: Negative.  Negative for abdominal pain.  Genitourinary: Negative.   Musculoskeletal: Positive for joint pain.  Neurological: Positive for sensory change. Negative for weakness.       With pain  Psychiatric/Behavioral: Negative.  The patient is not nervous/anxious.     As per HPI. Otherwise, a complete review of systems is negative.  PAST MEDICAL HISTORY: Past Medical History:  Diagnosis Date  . Allergy   . Arthritis   . Breast CA ([place])   . Breast cancer ([place]) 2013   left breast lumpectomy with chemo and rad tx  . COPD (chronic obstructive pulmonary disease) ([place])   . Headache    occasional - thinks it is from BP meds  . Hypertension   . Insomnia   . Metabolic syndrome   . Obesity    . Peripheral neuropathy ([place])     PAST SURGICAL HISTORY: Past Surgical History:  Procedure Laterality Date  . BLADDER SURGERY  2007  . BREAST LUMPECTOMY  2013  . BREAST SURGERY    . COLONOSCOPY WITH PROPOFOL N/A 04/04/2015   Procedure: COLONOSCOPY WITH PROPOFOL;  Surgeon: [name], MD;  Location: [place];  Service: Endoscopy;  Laterality: N/A;  . POLYPECTOMY  04/04/2015   Procedure: POLYPECTOMY;  Surgeon: [name], MD;  Location: [place];  Service: Endoscopy;;    FAMILY HISTORY Family History  Problem Relation Age of Onset  . Heart disease Mother   . Diabetes Father   . Hypertension Father        ADVANCED DIRECTIVES:    HEALTH MAINTENANCE: Social History  Substance Use Topics  . Smoking status: Current Every Day Smoker    Packs/day: 0.25    Years: 35.00    Types: Cigarettes, E-cigarettes  . Smokeless tobacco: Never Used  . Alcohol use No     Allergies  Allergen Reactions  . Ace Inhibitors Cough    Current Outpatient Prescriptions  Medication Sig Dispense Refill  . anastrozole (ARIMIDEX) 1 MG tablet Take 1 tablet (1 mg total) by mouth daily. 90 tablet 1  . aspirin 81 MG tablet Take 1 tablet by mouth daily.    . cyclobenzaprine (FLEXERIL) 10 MG tablet Take 0.5-1 tablets (5-10 mg total) by mouth at bedtime. 30 tablet 2  . fluticasone (FLONASE) 50 MCG/ACT nasal spray Place 2 sprays into the nose as needed.    . gabapentin (  NEURONTIN) 300 MG capsule Take 1 capsule (300 mg total) by mouth at bedtime. '200mg'$  morning and noon, 300 mg at bedtime - may increase to 300 mg TID PRN 30 capsule 2  . hydrochlorothiazide (HYDRODIURIL) 12.5 MG tablet Take 1 tablet (12.5 mg total) by mouth daily. 30 tablet 2  . Hydrocodone-Acetaminophen (VICODIN) 5-300 MG TABS Take 1 tablet by mouth daily as needed. 10 each 0  . Ipratropium-Albuterol (COMBIVENT RESPIMAT) 20-100 MCG/ACT AERS respimat Inhale 1 puff into the lungs as needed.    . loratadine (CLARITIN) 10  MG tablet Take 1 tablet (10 mg total) by mouth as needed for allergies. 30 tablet 2  . ranitidine (ZANTAC) 300 MG tablet Take 1 tablet (300 mg total) by mouth 2 (two) times daily. 60 tablet 3  . zolpidem (AMBIEN) 10 MG tablet Take 0.5-1 tablets (5-10 mg total) by mouth at bedtime. 30 tablet 2   No current facility-administered medications for this visit.     OBJECTIVE: Vitals:   05/08/16 1136  BP: (!) 157/96  Pulse: 92  Resp: 18  Temp: 98.5 F (36.9 C)     Body mass index is 35.17 kg/m.    ECOG FS:0 - Asymptomatic  General: Well-developed, well-nourished, no acute distress. Eyes: Pink conjunctiva, anicteric sclera. Breasts: Exam recently completed by other provider. Lungs: Clear to auscultation bilaterally. Heart: Regular rate and rhythm. No rubs, murmurs, or gallops. Abdomen: Soft, nontender, nondistended. No organomegaly noted, normoactive bowel sounds. Musculoskeletal: No edema, cyanosis, or clubbing. Neuro: Alert, answering all questions appropriately. Cranial nerves grossly intact. Skin: No rashes or petechiae noted. Psych: Normal affect.   LAB RESULTS:  Lab Results  Component Value Date   NA 140 03/19/2016   K 4.1 03/19/2016   CL 107 03/19/2016   CO2 26 03/19/2016   GLUCOSE 94 03/19/2016   BUN 12 03/19/2016   CREATININE 0.76 03/19/2016   CALCIUM 9.6 03/19/2016   PROT 7.1 03/19/2016   ALBUMIN 4.2 03/19/2016   AST 16 03/19/2016   ALT 15 03/19/2016   ALKPHOS 111 03/19/2016   BILITOT 0.3 03/19/2016   GFRNONAA >89 03/19/2016   GFRAA >89 03/19/2016    Lab Results  Component Value Date   WBC 8.5 03/19/2016   NEUTROABS 5,015 03/19/2016   HGB 13.1 03/19/2016   HCT 39.1 03/19/2016   MCV 81.6 03/19/2016   PLT 324 03/19/2016     STUDIES: No results found.  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   PLAN:    1.  Pathologic stage IIb ER/PR+, HER-2 negative carcinoma of the upper  inner quadrant of the left breast: No evidence of disease. Continue 5 years of anastrozole completing in September of 2018. Her most recent mammogram on October 27, 2015 was reported as BI-RADS 2. Repeat in April 2018. Return to clinic in 6 months for further evaluation. 2. Osteopenia: Bone mineral density on October 27, 2015 was reported -2.1 and essentially unchanged. Repeat in one year in April 2018.  Continue calcium and vitamin D supplementation.   3. Peripheral neuropathy: Continue current dose of gabapentin. Have also suggested switching to Lyrica, but patient declined at this time. Consider neurology referral.  Patient expressed understanding and was in agreement with this plan. She also understands that She can call clinic at any time with any questions, concerns, or complaints.    Lloyd Huger, MD   05/11/2016 2:56 PM

## 2016-05-08 ENCOUNTER — Inpatient Hospital Stay: Payer: 59 | Attending: Oncology | Admitting: Oncology

## 2016-05-08 VITALS — BP 157/96 | HR 92 | Temp 98.5°F | Resp 18 | Wt 204.9 lb

## 2016-05-08 DIAGNOSIS — Z170 Estrogen receptor positive status [ER+]: Secondary | ICD-10-CM | POA: Diagnosis not present

## 2016-05-08 DIAGNOSIS — Z79899 Other long term (current) drug therapy: Secondary | ICD-10-CM | POA: Insufficient documentation

## 2016-05-08 DIAGNOSIS — M2550 Pain in unspecified joint: Secondary | ICD-10-CM | POA: Insufficient documentation

## 2016-05-08 DIAGNOSIS — C50212 Malignant neoplasm of upper-inner quadrant of left female breast: Secondary | ICD-10-CM | POA: Diagnosis present

## 2016-05-08 DIAGNOSIS — Z79811 Long term (current) use of aromatase inhibitors: Secondary | ICD-10-CM | POA: Diagnosis not present

## 2016-05-08 DIAGNOSIS — E669 Obesity, unspecified: Secondary | ICD-10-CM

## 2016-05-08 DIAGNOSIS — M129 Arthropathy, unspecified: Secondary | ICD-10-CM | POA: Diagnosis not present

## 2016-05-08 DIAGNOSIS — F17210 Nicotine dependence, cigarettes, uncomplicated: Secondary | ICD-10-CM

## 2016-05-08 DIAGNOSIS — Z7982 Long term (current) use of aspirin: Secondary | ICD-10-CM | POA: Insufficient documentation

## 2016-05-08 DIAGNOSIS — M8580 Other specified disorders of bone density and structure, unspecified site: Secondary | ICD-10-CM | POA: Diagnosis not present

## 2016-05-08 DIAGNOSIS — G629 Polyneuropathy, unspecified: Secondary | ICD-10-CM | POA: Insufficient documentation

## 2016-05-08 DIAGNOSIS — I10 Essential (primary) hypertension: Secondary | ICD-10-CM | POA: Diagnosis not present

## 2016-05-08 DIAGNOSIS — R232 Flushing: Secondary | ICD-10-CM

## 2016-05-08 DIAGNOSIS — G4700 Insomnia, unspecified: Secondary | ICD-10-CM

## 2016-05-08 DIAGNOSIS — J449 Chronic obstructive pulmonary disease, unspecified: Secondary | ICD-10-CM | POA: Diagnosis not present

## 2016-05-08 NOTE — Progress Notes (Signed)
Complains of neuropathy in both feet. Had physical with PCP at end of August and breast exam was performed at that time.

## 2016-05-28 ENCOUNTER — Encounter: Payer: Self-pay | Admitting: Family Medicine

## 2016-05-28 ENCOUNTER — Ambulatory Visit (INDEPENDENT_AMBULATORY_CARE_PROVIDER_SITE_OTHER): Payer: 59 | Admitting: Family Medicine

## 2016-05-28 VITALS — BP 142/88 | HR 85 | Temp 99.2°F | Resp 16 | Ht 64.0 in | Wt 201.0 lb

## 2016-05-28 DIAGNOSIS — G4700 Insomnia, unspecified: Secondary | ICD-10-CM

## 2016-05-28 DIAGNOSIS — K219 Gastro-esophageal reflux disease without esophagitis: Secondary | ICD-10-CM | POA: Diagnosis not present

## 2016-05-28 DIAGNOSIS — E782 Mixed hyperlipidemia: Secondary | ICD-10-CM | POA: Diagnosis not present

## 2016-05-28 DIAGNOSIS — G609 Hereditary and idiopathic neuropathy, unspecified: Secondary | ICD-10-CM

## 2016-05-28 DIAGNOSIS — E8881 Metabolic syndrome: Secondary | ICD-10-CM

## 2016-05-28 DIAGNOSIS — I10 Essential (primary) hypertension: Secondary | ICD-10-CM | POA: Diagnosis not present

## 2016-05-28 DIAGNOSIS — M8580 Other specified disorders of bone density and structure, unspecified site: Secondary | ICD-10-CM | POA: Diagnosis not present

## 2016-05-28 DIAGNOSIS — IMO0002 Reserved for concepts with insufficient information to code with codable children: Secondary | ICD-10-CM

## 2016-05-28 DIAGNOSIS — J449 Chronic obstructive pulmonary disease, unspecified: Secondary | ICD-10-CM

## 2016-05-28 DIAGNOSIS — C50212 Malignant neoplasm of upper-inner quadrant of left female breast: Secondary | ICD-10-CM | POA: Diagnosis not present

## 2016-05-28 DIAGNOSIS — M62838 Other muscle spasm: Secondary | ICD-10-CM

## 2016-05-28 DIAGNOSIS — G63 Polyneuropathy in diseases classified elsewhere: Secondary | ICD-10-CM

## 2016-05-28 MED ORDER — RANITIDINE HCL 300 MG PO TABS: 300.0000 mg | ORAL_TABLET | Freq: Two times a day (BID) | ORAL | 2 refills | Status: DC

## 2016-05-28 MED ORDER — COENZYME Q10 100 MG PO CAPS: 100.0000 mg | ORAL_CAPSULE | Freq: Every day | ORAL | 5 refills | Status: DC

## 2016-05-28 MED ORDER — HYDROCHLOROTHIAZIDE 12.5 MG PO TABS
12.5000 mg | ORAL_TABLET | Freq: Every day | ORAL | 2 refills | Status: DC
Start: 2016-05-28 — End: 2016-08-29

## 2016-05-28 MED ORDER — ZOLPIDEM TARTRATE 10 MG PO TABS: 5.0000 mg | ORAL_TABLET | Freq: Every day | ORAL | 2 refills | Status: DC

## 2016-05-28 MED ORDER — MAGNESIUM 200 MG PO TABS: 1.0000 | ORAL_TABLET | Freq: Every day | ORAL | 5 refills | Status: DC

## 2016-05-28 MED ORDER — CYCLOBENZAPRINE HCL 10 MG PO TABS: 5.0000 mg | ORAL_TABLET | Freq: Every day | ORAL | 2 refills | Status: DC

## 2016-05-28 NOTE — Progress Notes (Signed)
Name: Brenda Jones   MRN: [id number]    DOB: 04-27-1966   Date:05/28/2016       Progress Note  Subjective  Chief Complaint  Chief Complaint  Patient presents with  . Hypertension  . Medication Refill  . Insomnia    HPI  HTN: taking bp medication daily,however did not take it this am because she was out of medication. BP was high during Oncologist visit - she states because co-pay price with sub-specialist is higher and she got upset. No longer dizziness since we stopped Diovan and she is currently only taking HCTZ. 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 Seen by Dr. Grayland Ormond, currently only calcium plus D, she had repeat bone density in April and is stable, continue supplements. Last vitamin D level was at goal   Insomnia: medication helps her fall and stay asleep, but wakes up at 2 am to go to work. She has been doing better about going to be earlier and getting at least 7 hours of sleep. Discussed FDA and the need to try going down to 5 mg qhs but she states it took her too long to fall asleep with lower dose  COPD: she is still smoking, now about 2-3 cigarettes a day, but also uses e-cigarettes. Feeling better, off Spiriva and using Combivent prn. No SOB with activity, denies daily cough, no wheezing.   Muscles Spasms: got much worse with cancer therapy - in 2013 and takes Cyclobenzaprine to control symptoms. It can be on her trunk or legs, discussed adding Magnesium and Co-Q 10 daily to see if it improves symptoms.   Breast cancer left: seeing Dr. Grayland Ormond, s/p lumpectomy still on oral suppressive medication, mammogram is up to date. Still taking Anastrozole.   Peripheral Neuropathy: still has daily pain on both legs and feet also also tips of fingers. She is on Gabapentin, but unable to take it before work, she has not noticed much of an improvement and discussed switching to Lyrica, but she is worried about cost, she does not want to see  neurologist   She states pain right now is 7 /10 average  Obesity: she decreased drinking sodas, used to drink 5-6 daily and is down to 1 daily,she has lost 13 bs but stable weight now. No polyphagia, polydipsia or polyuria  Patient Active Problem List   Diagnosis Date Noted  . Osteopenia due to cancer therapy 04/18/2015  . GERD (gastroesophageal reflux disease) 04/18/2015  . Muscle spasm 04/18/2015  . Benign neoplasm of descending colon   . Rectal polyp   . Cervical high risk HPV (human papillomavirus) test positive 03/22/2015  . Benign essential HTN 01/08/2015  . Insomnia, persistent 01/08/2015  . COPD, mild ([place]) 01/08/2015  . HLD (hyperlipidemia) 01/08/2015  . Dysmetabolic syndrome 55/20/8022  . Adult BMI 30+ 01/08/2015  . Seasonal allergic rhinitis 01/08/2015  . Secondary peripheral neuropathy 01/08/2015  . Arthralgia of multiple joints 01/08/2015  . Primary cancer of upper inner quadrant of left female breast ([place]) 02/21/2011    Past Surgical History:  Procedure Laterality Date  . BLADDER SURGERY  2007  . BREAST LUMPECTOMY  2013  . BREAST SURGERY    . COLONOSCOPY WITH PROPOFOL N/A 04/04/2015   Procedure: COLONOSCOPY WITH PROPOFOL;  Surgeon: [name], MD;  Location: [facility];  Service: Endoscopy;  Laterality: N/A;  . POLYPECTOMY  04/04/2015   Procedure: POLYPECTOMY;  Surgeon: [name], MD;  Location: [facility];  Service: Endoscopy;;  Family History  Problem Relation Age of Onset  . Heart disease Mother   . Diabetes Father   . Hypertension Father     Social History   Social History  . Marital status: Single    Spouse name: N/A  . Number of children: N/A  . Years of education: N/A   Occupational History  . Not on file.   Social History Main Topics  . Smoking status: Current Every Day Smoker    Packs/day: 0.25    Years: 35.00    Types: Cigarettes, E-cigarettes  . Smokeless tobacco: Never Used  . Alcohol use No  . Drug use: No   . Sexual activity: No   Other Topics Concern  . Not on file   Social History Narrative  . No narrative on file     Current Outpatient Prescriptions:  .  anastrozole (ARIMIDEX) 1 MG tablet, Take 1 tablet (1 mg total) by mouth daily., Disp: 90 tablet, Rfl: 1 .  aspirin 81 MG tablet, Take 1 tablet by mouth daily., Disp: , Rfl:  .  cyclobenzaprine (FLEXERIL) 10 MG tablet, Take 0.5-1 tablets (5-10 mg total) by mouth at bedtime., Disp: 30 tablet, Rfl: 2 .  fluticasone (FLONASE) 50 MCG/ACT nasal spray, Place 2 sprays into the nose as needed., Disp: , Rfl:  .  gabapentin (NEURONTIN) 300 MG capsule, Take 1 capsule (300 mg total) by mouth at bedtime. 250m morning and noon, 300 mg at bedtime - may increase to 300 mg TID PRN, Disp: 30 capsule, Rfl: 2 .  hydrochlorothiazide (HYDRODIURIL) 12.5 MG tablet, Take 1 tablet (12.5 mg total) by mouth daily., Disp: 30 tablet, Rfl: 2 .  Hydrocodone-Acetaminophen (VICODIN) 5-300 MG TABS, Take 1 tablet by mouth daily as needed., Disp: 10 each, Rfl: 0 .  Ipratropium-Albuterol (COMBIVENT RESPIMAT) 20-100 MCG/ACT AERS respimat, Inhale 1 puff into the lungs as needed., Disp: , Rfl:  .  loratadine (CLARITIN) 10 MG tablet, Take 1 tablet (10 mg total) by mouth as needed for allergies., Disp: 30 tablet, Rfl: 2 .  ranitidine (ZANTAC) 300 MG tablet, Take 1 tablet (300 mg total) by mouth 2 (two) times daily., Disp: 60 tablet, Rfl: 3 .  zolpidem (AMBIEN) 10 MG tablet, Take 0.5-1 tablets (5-10 mg total) by mouth at bedtime., Disp: 30 tablet, Rfl: 2  Allergies  Allergen Reactions  . Ace Inhibitors Cough     ROS  Constitutional: Negative for fever or significant weight change.  Respiratory: Negative for cough and shortness of breath.   Cardiovascular: Negative for chest pain or palpitations.  Gastrointestinal: Negative for abdominal pain, no bowel changes.  Musculoskeletal: Negative for gait problem or joint swelling.  Skin: Negative for rash.  Neurological: Negative  for dizziness or headache.  No other specific complaints in a complete review of systems (except as listed in HPI above).  Objective  Vitals:   05/28/16 0933  BP: (!) 142/88  Pulse: 85  Resp: 16  Temp: 99.2 F (37.3 C)  TempSrc: Oral  SpO2: 98%  Weight: 201 lb (91.2 kg)  Height: _0  (1.626 m)    Body mass index is 34.5 kg/m.  Physical Exam    Constitutional: Patient appears well-developed and well-nourished. Obese No distress.  HEENT: head atraumatic, normocephalic, pupils equal and reactive to light,  neck supple, throat within normal limits Cardiovascular: Normal rate, regular rhythm and normal heart sounds.  No murmur heard. No BLE edema. Pulmonary/Chest: Effort normal and breath sounds normal. No respiratory distress. Abdominal: Soft.  There is  no tenderness. Psychiatric: Patient has a normal mood and affect. behavior is normal. Judgment and thought content normal.  Recent Results (from the past 2160 hour(s))  PapLb, HPV, rfx16/18     Status: None   Collection Time: 03/19/16 12:00 AM  Result Value Ref Range   DIAGNOSIS: Comment     Comment: NEGATIVE FOR INTRAEPITHELIAL LESION AND MALIGNANCY.   Specimen adequacy: Comment     Comment: Satisfactory for evaluation. Endocervical and/or squamous metaplastic cells (endocervical component) are present.    CLINICIAN PROVIDED ICD10: Comment     Comment: R87.810   Performed by: Comment     Comment: Dimitria Harding, Cytotechnologist (ASCP)   PAP SMEAR COMMENT .    Note: Comment     Comment: The Pap smear is a screening test designed to aid in the detection of premalignant and malignant conditions of the uterine cervix.  It is not a diagnostic procedure and should not be used as the sole means of detecting cervical cancer.  Both false-positive and false-negative reports do occur.    HPV, high-risk Negative Negative    Comment: This high-risk HPV test detects thirteen high-risk  types (16/18/31/33/35/39/45/51/52/56/58/59/68) without differentiation.   Lipid panel     Status: None   Collection Time: 03/19/16 10:22 AM  Result Value Ref Range   Cholesterol 133 125 - 200 mg/dL   Triglycerides 73 <150 mg/dL   HDL 49 >=46 mg/dL   Total CHOL/HDL Ratio 2.7 <=5.0 Ratio   VLDL 15 <30 mg/dL   LDL Cholesterol 69 <130 mg/dL    Comment:   Total Cholesterol/HDL Ratio:CHD Risk                        Coronary Heart Disease Risk Table                                        Men       Women          1/2 Average Risk              3.4        3.3              Average Risk              5.0        4.4           2X Average Risk              9.6        7.1           3X Average Risk             23.4       11.0 Use the calculated Patient Ratio above and the CHD Risk table  to determine the patient's CHD Risk.   Hemoglobin A1c     Status: Abnormal   Collection Time: 03/19/16 10:22 AM  Result Value Ref Range   Hgb A1c MFr Bld 5.8 (H) <5.7 %    Comment:   For someone without known diabetes, a hemoglobin A1c value between 5.7% and 6.4% is consistent with prediabetes and should be confirmed with a follow-up test.   For someone with known diabetes, a value <7% indicates that their diabetes is well controlled. A1c targets should be individualized based on duration of diabetes, age, co-morbid conditions and  other considerations.   This assay result is consistent with an increased risk of diabetes.   Currently, no consensus exists regarding use of hemoglobin A1c for diagnosis of diabetes in children.      Mean Plasma Glucose 120 mg/dL  COMPLETE METABOLIC PANEL WITH GFR     Status: None   Collection Time: 03/19/16 10:22 AM  Result Value Ref Range   Sodium 140 135 - 146 mmol/L   Potassium 4.1 3.5 - 5.3 mmol/L   Chloride 107 98 - 110 mmol/L   CO2 26 20 - 31 mmol/L   Glucose, Bld 94 65 - 99 mg/dL   BUN 12 7 - 25 mg/dL   Creat 0.76 0.50 - 1.05 mg/dL    Comment:   For patients >  or = 51 years of age: The upper reference limit for Creatinine is approximately 13% higher for people identified as African-American.      Total Bilirubin 0.3 0.2 - 1.2 mg/dL   Alkaline Phosphatase 111 33 - 130 U/L   AST 16 10 - 35 U/L   ALT 15 6 - 29 U/L   Total Protein 7.1 6.1 - 8.1 g/dL   Albumin 4.2 3.6 - 5.1 g/dL   Calcium 9.6 8.6 - 10.4 mg/dL   GFR, Est African American >89 >=60 mL/min   GFR, Est Non African American >89 >=60 mL/min  CBC with Differential/Platelet     Status: Abnormal   Collection Time: 03/19/16 10:22 AM  Result Value Ref Range   WBC 8.5 3.8 - 10.8 K/uL   RBC 4.79 3.80 - 5.10 MIL/uL   Hemoglobin 13.1 11.7 - 15.5 g/dL   HCT 39.1 35.0 - 45.0 %   MCV 81.6 80.0 - 100.0 fL   MCH 27.3 27.0 - 33.0 pg   MCHC 33.5 32.0 - 36.0 g/dL   RDW 15.4 (H) 11.0 - 15.0 %   Platelets 324 140 - 400 K/uL   MPV 11.2 7.5 - 12.5 fL   Neutro Abs 5,015 1,500 - 7,800 cells/uL   Lymphs Abs 2,890 850 - 3,900 cells/uL   Monocytes Absolute 510 200 - 950 cells/uL   Eosinophils Absolute 85 15 - 500 cells/uL   Basophils Absolute 0 0 - 200 cells/uL   Neutrophils Relative % 59 %   Lymphocytes Relative 34 %   Monocytes Relative 6 %   Eosinophils Relative 1 %   Basophils Relative 0 %   Smear Review Criteria for review not met   HIV antibody     Status: None   Collection Time: 03/19/16 10:22 AM  Result Value Ref Range   HIV 1&2 Ab, 4th Generation NONREACTIVE NONREACTIVE    Comment:   HIV-1 antigen and HIV-1/HIV-2 antibodies were not detected.  There is no laboratory evidence of HIV infection.   HIV-1/2 Antibody Diff        Not indicated. HIV-1 RNA, Qual TMA          Not indicated.     PLEASE NOTE: This information has been disclosed to you from records whose confidentiality may be protected by state law. If your state requires such protection, then the state law prohibits you from making any further disclosure of the information without the specific written consent of the person to  whom it pertains, or as otherwise permitted by law. A general authorization for the release of medical or other information is NOT sufficient for this purpose.   The performance of this assay has not been clinically validated in patients less  than 61 years old.   For additional information please refer to http://education.[facility].com/faq/FAQ106.  (This link is being provided for informational/educational purposes only.)     VITAMIN D 25 Hydroxy (Vit-D Deficiency, Fractures)     Status: None   Collection Time: 03/19/16 10:22 AM  Result Value Ref Range   Vit D, 25-Hydroxy 34 30 - 100 ng/mL    Comment: Vitamin D Status           25-OH Vitamin D        Deficiency                <20 ng/mL        Insufficiency         20 - 29 ng/mL        Optimal             > or = 30 ng/mL   For 25-OH Vitamin D testing on patients on D2-supplementation and patients for whom quantitation of D2 and D3 fractions is required, the [facility] 25-OH VIT D, (D2,D3), LC/MS/MS is recommended: order code [phone] (patients > 2 yrs).      PHQ2/9: Depression screen [facility] 2/9 05/28/2016 02/24/2016 11/08/2015 08/04/2015 03/17/2015  Decreased Interest 0 0 0 0 0  Down, Depressed, Hopeless 0 0 0 0 0  PHQ - 2 Score 0 0 0 0 0    Fall Risk: Fall Risk  05/28/2016 02/24/2016 11/08/2015 08/04/2015 03/17/2015  Falls in the past year? _0      Functional Status Survey: Is the patient deaf or have difficulty hearing?: No Does the patient have difficulty seeing, even when wearing glasses/contacts?: No Does the patient have difficulty concentrating, remembering, or making decisions?: No Does the patient have difficulty walking or climbing stairs?: No Does the patient have difficulty dressing or bathing?: No Does the patient have difficulty doing errands alone such as visiting a doctor's office or shopping?: No    Assessment & Plan   1. Benign essential HTN  bp elevated, but did not take medications today,  we will continue to monitor for now - hydrochlorothiazide (HYDRODIURIL) 12.5 MG tablet; Take 1 tablet (12.5 mg total) by mouth daily.  Dispense: 30 tablet; Refill: 2  2. COPD, mild ([place])  Advised her to quit smoking again   3. Dysmetabolic syndrome  Last DHRC1U was 5.8%. Discussed low sugar diet  4. Mixed hyperlipidemia  Discussed life style modification   5. Secondary peripheral neuropathy  She can't afford Lyrica or referral to neurologist at this time  6. Osteopenia due to cancer therapy  stable  7. Primary cancer of upper inner quadrant of left female breast ([place])  Continue follow up with Dr. Grayland Ormond  8. Muscle spasm  - cyclobenzaprine (FLEXERIL) 10 MG tablet; Take 0.5-1 tablets (5-10 mg total) by mouth at bedtime.  Dispense: 30 tablet; Refill: 2 - Magnesium 200 MG TABS; Take 1 tablet (200 mg total) by mouth daily.  Dispense: 30 each; Refill: 5 - Coenzyme Q10 100 MG capsule; Take 1 capsule (100 mg total) by mouth daily.  Dispense: 30 capsule; Refill: 5  9. Gastroesophageal reflux disease without esophagitis  - ranitidine (ZANTAC) 300 MG tablet; Take 1 tablet (300 mg total) by mouth 2 (two) times daily.  Dispense: 60 tablet; Refill: 2  10. Insomnia, persistent  - zolpidem (AMBIEN) 10 MG tablet; Take 0.5-1 tablets (5-10 mg total) by mouth at bedtime.  Dispense: 30 tablet; Refill: 2

## 2016-08-29 ENCOUNTER — Ambulatory Visit (INDEPENDENT_AMBULATORY_CARE_PROVIDER_SITE_OTHER): Payer: 59 | Admitting: Family Medicine

## 2016-08-29 ENCOUNTER — Encounter: Payer: Self-pay | Admitting: Family Medicine

## 2016-08-29 ENCOUNTER — Ambulatory Visit
Admission: RE | Admit: 2016-08-29 | Discharge: 2016-08-29 | Disposition: A | Discharge status: Home / Self Care | Payer: 59 | Source: Ambulatory Visit | Attending: Family Medicine | Admitting: Family Medicine

## 2016-08-29 ENCOUNTER — Other Ambulatory Visit: Payer: Self-pay | Admitting: Family Medicine

## 2016-08-29 VITALS — BP 118/64 | HR 100 | Temp 98.7°F | Resp 16 | Ht 64.0 in | Wt 199.4 lb

## 2016-08-29 DIAGNOSIS — M1711 Unilateral primary osteoarthritis, right knee: Secondary | ICD-10-CM | POA: Diagnosis not present

## 2016-08-29 DIAGNOSIS — I10 Essential (primary) hypertension: Secondary | ICD-10-CM | POA: Diagnosis not present

## 2016-08-29 DIAGNOSIS — M25561 Pain in right knee: Secondary | ICD-10-CM | POA: Diagnosis not present

## 2016-08-29 DIAGNOSIS — M25461 Effusion, right knee: Secondary | ICD-10-CM | POA: Diagnosis not present

## 2016-08-29 DIAGNOSIS — E782 Mixed hyperlipidemia: Secondary | ICD-10-CM | POA: Diagnosis not present

## 2016-08-29 DIAGNOSIS — E8881 Metabolic syndrome: Secondary | ICD-10-CM | POA: Diagnosis not present

## 2016-08-29 DIAGNOSIS — J449 Chronic obstructive pulmonary disease, unspecified: Secondary | ICD-10-CM

## 2016-08-29 DIAGNOSIS — C50212 Malignant neoplasm of upper-inner quadrant of left female breast: Secondary | ICD-10-CM | POA: Diagnosis not present

## 2016-08-29 DIAGNOSIS — G4700 Insomnia, unspecified: Secondary | ICD-10-CM

## 2016-08-29 DIAGNOSIS — M62838 Other muscle spasm: Secondary | ICD-10-CM

## 2016-08-29 DIAGNOSIS — M722 Plantar fascial fibromatosis: Secondary | ICD-10-CM

## 2016-08-29 MED ORDER — HYDROCHLOROTHIAZIDE 12.5 MG PO TABS: 12.5000 mg | ORAL_TABLET | Freq: Every day | ORAL | 2 refills | Status: DC

## 2016-08-29 MED ORDER — ZOLPIDEM TARTRATE 10 MG PO TABS: 5.0000 mg | ORAL_TABLET | Freq: Every day | ORAL | 2 refills | Status: DC

## 2016-08-29 MED ORDER — CYCLOBENZAPRINE HCL 10 MG PO TABS: 5.0000 mg | ORAL_TABLET | Freq: Every day | ORAL | 2 refills | Status: DC

## 2016-08-29 MED ORDER — MELOXICAM 15 MG PO TABS: 15.0000 mg | ORAL_TABLET | Freq: Every day | ORAL | 0 refills | Status: DC

## 2016-08-29 NOTE — Progress Notes (Signed)
Name: Brenda Jones   MRN: [id number]    DOB: 04-11-1966   Date:08/29/2016       Progress Note  Subjective  Chief Complaint  Chief Complaint  Patient presents with  . Hypertension  . Obesity  . COPD  . Insomnia  . Joint Swelling    swelling for about a month    HPI  HTN: taking bp medication daily,however did not take it this am because she was out of medication. 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 Seen by Dr. Grayland Ormond, currently only calcium plus D, she had repeat bone density in April and is stable, continue supplements. Last vitamin D level was at goal   Insomnia: medication helps her fall and stay asleep, but wakes up at 2 am to go to work. She has been  getting about 5-6 hours of sleep now, because of her part time job.  Discussed FDA and the need to try going down to 5 mg qhs but she states it took her too long to fall asleep with lower dose. She denies side effects, she is aware that she should not take Flexeril and Ambien at the same time  COPD: she is still smoking, now about 2-3 cigarettes a day, but also uses e-cigarettes. Feeling better, off Spiriva and using Combivent prn. No SOB with activity,  no wheezing and no recent cough.   Muscles Spasms: got much worse with cancer therapy - in 2013 and takes Cyclobenzaprine to control symptoms. It can be on her trunk or legs, discussed adding Magnesium and Co-Q 10 daily to see if it improves symptoms.   Breast cancer left: seeing Dr. Grayland Ormond, s/p lumpectomy still on oral suppressive medication, mammogram is up to date. Still taking Anastrozole. She denies side effects  Peripheral Neuropathy: still has daily pain on both legs and feet also also tips of fingers. She is on Gabapentin, but unable to take it before work, she has not noticed much of an improvement and discussed switching to Lyrica, but she is worried about cost, she does not want to see neurologist   She states pain right  now is 8 /10 average - worse than last visit   Obesity: she decreased drinking sodas, used to drink 5-6 daily and is down to 1 daily sometimes [employer], she has lost another 2 lbs since last visit. Total of 15 lbs since she cut down on soda intake . No polyphagia, occasional polydipsia , but she has polyuria - she thinks related to HCTZ  Plantar Fascitis: she states she noticed pain on right heel for the past month, worse when she first stands up, and is getting progressively, initially use to get better, but now it hurts all day, she also has noticed that the right knee has been swelling and painful with certain range of movements. Started after right heel pain. She has a limp when pain is severe. No redness or increase in warmth. No trauma  Patient Active Problem List   Diagnosis Date Noted  . Osteopenia due to cancer therapy 04/18/2015  . GERD (gastroesophageal reflux disease) 04/18/2015  . Muscle spasm 04/18/2015  . Benign neoplasm of descending colon   . Rectal polyp   . Cervical high risk HPV (human papillomavirus) test positive 03/22/2015  . Benign essential HTN 01/08/2015  . Insomnia, persistent 01/08/2015  . COPD, mild ([place]) 01/08/2015  . HLD (hyperlipidemia) 01/08/2015  . Dysmetabolic syndrome [id number]  . Adult BMI 30+ 01/08/2015  .  Seasonal allergic rhinitis 01/08/2015  . Secondary peripheral neuropathy 01/08/2015  . Arthralgia of multiple joints 01/08/2015  . Primary cancer of upper inner quadrant of left female breast ([place]) 02/21/2011    Past Surgical History:  Procedure Laterality Date  . BLADDER SURGERY  2007  . BREAST LUMPECTOMY  2013  . BREAST SURGERY    . COLONOSCOPY WITH PROPOFOL N/A 04/04/2015   Procedure: COLONOSCOPY WITH PROPOFOL;  Surgeon: [name], MD;  Location: [place];  Service: Endoscopy;  Laterality: N/A;  . POLYPECTOMY  04/04/2015   Procedure: POLYPECTOMY;  Surgeon: [name], MD;  Location: [facility];  Service: Endoscopy;;     Family History  Problem Relation Age of Onset  . Heart disease Mother   . Diabetes Father   . Hypertension Father     Social History   Social History  . Marital status: Single    Spouse name: N/A  . Number of children: N/A  . Years of education: N/A   Occupational History  . Not on file.   Social History Main Topics  . Smoking status: Current Every Day Smoker    Packs/day: 0.25    Years: 35.00    Types: Cigarettes, E-cigarettes  . Smokeless tobacco: Never Used  . Alcohol use No  . Drug use: No  . Sexual activity: No   Other Topics Concern  . Not on file   Social History Narrative  . No narrative on file     Current Outpatient Prescriptions:  .  anastrozole (ARIMIDEX) 1 MG tablet, Take 1 tablet (1 mg total) by mouth daily., Disp: 90 tablet, Rfl: 1 .  aspirin 81 MG tablet, Take 1 tablet by mouth daily., Disp: , Rfl:  .  Coenzyme Q10 100 MG capsule, Take 1 capsule (100 mg total) by mouth daily., Disp: 30 capsule, Rfl: 5 .  cyclobenzaprine (FLEXERIL) 10 MG tablet, Take 0.5-1 tablets (5-10 mg total) by mouth at bedtime., Disp: 30 tablet, Rfl: 2 .  fluticasone (FLONASE) 50 MCG/ACT nasal spray, Place 2 sprays into the nose as needed., Disp: , Rfl:  .  gabapentin (NEURONTIN) 300 MG capsule, Take 1 capsule (300 mg total) by mouth at bedtime. 200mg  morning and noon, 300 mg at bedtime - may increase to 300 mg TID PRN, Disp: 30 capsule, Rfl: 2 .  hydrochlorothiazide (HYDRODIURIL) 12.5 MG tablet, Take 1 tablet (12.5 mg total) by mouth daily., Disp: 30 tablet, Rfl: 2 .  Ipratropium-Albuterol (COMBIVENT RESPIMAT) 20-100 MCG/ACT AERS respimat, Inhale 1 puff into the lungs as needed., Disp: , Rfl:  .  loratadine (CLARITIN) 10 MG tablet, Take 1 tablet (10 mg total) by mouth as needed for allergies., Disp: 30 tablet, Rfl: 2 .  Magnesium 200 MG TABS, Take 1 tablet (200 mg total) by mouth daily., Disp: 30 each, Rfl: 5 .  meloxicam (MOBIC) 15 MG tablet, Take 1 tablet (15 mg total) by  mouth daily., Disp: 30 tablet, Rfl: 0 .  ranitidine (ZANTAC) 300 MG tablet, Take 1 tablet (300 mg total) by mouth 2 (two) times daily., Disp: 60 tablet, Rfl: 2 .  zolpidem (AMBIEN) 10 MG tablet, Take 0.5-1 tablets (5-10 mg total) by mouth at bedtime., Disp: 30 tablet, Rfl: 2  Allergies  Allergen Reactions  . Ace Inhibitors Cough     ROS  Constitutional: Negative for fever or significant weight change.  Respiratory: Negative for cough and shortness of breath.   Cardiovascular: Negative for chest pain or palpitations.  Gastrointestinal: Negative for abdominal pain, no  bowel changes.  Musculoskeletal: Positive  for gait problem or joint swelling.  Skin: Negative for rash.  Neurological: Negative for dizziness or headache.  No other specific complaints in a complete review of systems (except as listed in HPI above).  Objective  Vitals:   08/29/16 0803 08/29/16 0841  BP: 118/64   Pulse: (!) 113 100  Resp: 16   Temp: 98.7 F (37.1 C)   SpO2: 96%   Weight: 199 lb 6 oz (90.4 kg)   Height: 5\' 4"  (1.626 m)     Body mass index is 34.22 kg/m.  Physical Exam  Constitutional: Patient appears well-developed and well-nourished. Obese  No distress.  HEENT: head atraumatic, normocephalic, pupils equal and reactive to light,  neck supple, throat within normal limits Cardiovascular: Normal rate, regular rhythm and normal heart sounds.  No murmur heard. No BLE edema. Pulmonary/Chest: Effort normal and breath sounds normal. No respiratory distress. Abdominal: Soft.  There is no tenderness. Psychiatric: Patient has a normal mood and affect. behavior is normal. Judgment and thought content normal. Muscular Skeletal: mild right knee effusion, crepitus with extension of right knee, pain during palpation of right plantar fascia, pes planus   PHQ2/9: Depression screen [facility] 2/9 08/29/2016 05/28/2016 02/24/2016 11/08/2015 08/04/2015  Decreased Interest 0 0 0 0 0  Down, Depressed, Hopeless 0 0 0 0 0   PHQ - 2 Score 0 0 0 0 0    Fall Risk: Fall Risk  08/29/2016 05/28/2016 02/24/2016 11/08/2015 08/04/2015  Falls in the past year? No No No No No    Functional Status Survey: Is the patient deaf or have difficulty hearing?: No Does the patient have difficulty seeing, even when wearing glasses/contacts?: No Does the patient have difficulty concentrating, remembering, or making decisions?: No Does the patient have difficulty walking or climbing stairs?: Yes (recent knee pain) Does the patient have difficulty dressing or bathing?: No Does the patient have difficulty doing errands alone such as visiting a doctor's office or shopping?: No    Assessment & Plan  1. Benign essential HTN  - hydrochlorothiazide (HYDRODIURIL) 12.5 MG tablet; Take 1 tablet (12.5 mg total) by mouth daily.  Dispense: 30 tablet; Refill: 2  2. Mixed hyperlipidemia  On lifestyle changes, HDL was 49 on her last viist   3. Dysmetabolic syndrome   4. COPD, mild ([place])  Stable, she is still smoking, but needs to quit  5. Primary cancer of upper inner quadrant of left female breast ([facility])  On Anastrozole  6. Plantar fasciitis, right  - meloxicam (MOBIC) 15 MG tablet; Take 1 tablet (15 mg total) by mouth daily.  Dispense: 30 tablet; Refill: 0 Gave her card for chiropractor, explained that she will need insoles  7. Effusion of right knee  - meloxicam (MOBIC) 15 MG tablet; Take 1 tablet (15 mg total) by mouth daily.  Dispense: 30 tablet; Refill: 0 -X-ray knee   8. Muscle spasm  - cyclobenzaprine (FLEXERIL) 10 MG tablet; Take 0.5-1 tablets (5-10 mg total) by mouth at bedtime.  Dispense: 30 tablet; Refill: 2  9. Insomnia, persistent  - zolpidem (AMBIEN) 10 MG tablet; Take 0.5-1 tablets (5-10 mg total) by mouth at bedtime.  Dispense: 30 tablet; Refill: 2

## 2016-08-29 NOTE — Addendum Note (Signed)
Addended by: Steele Sizer F on: 08/29/2016 09:16 AM   Modules accepted: Orders

## 2016-08-29 NOTE — Telephone Encounter (Signed)
Patient requesting refill of Ambien.

## 2016-08-31 ENCOUNTER — Telehealth: Payer: Self-pay | Admitting: Family Medicine

## 2016-08-31 DIAGNOSIS — M25461 Effusion, right knee: Secondary | ICD-10-CM

## 2016-08-31 DIAGNOSIS — M1711 Unilateral primary osteoarthritis, right knee: Secondary | ICD-10-CM

## 2016-08-31 NOTE — Telephone Encounter (Signed)
Pt wants to know if RX results from her knee are back, if so she is asking for a call.

## 2016-08-31 NOTE — Telephone Encounter (Signed)
Patient states she would like to see a orthopedic for knee problems.

## 2016-09-04 DIAGNOSIS — M1711 Unilateral primary osteoarthritis, right knee: Secondary | ICD-10-CM | POA: Diagnosis not present

## 2016-10-29 ENCOUNTER — Ambulatory Visit: Payer: 59 | Attending: Oncology

## 2016-10-29 ENCOUNTER — Other Ambulatory Visit: Payer: 59

## 2016-10-29 ENCOUNTER — Ambulatory Visit: Admission: RE | Admit: 2016-10-29 | Payer: 59 | Source: Ambulatory Visit

## 2016-11-05 NOTE — Progress Notes (Deleted)
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  INTERVAL HISTORY: Patient returns to clinic today for routine 6 month evaluation.  She continues to have peripheral neuropathy that is unchanged.  She is tolerating anastrozole well without significant side effects. She does admit to occasional hot flashes that are tolerable. She has no other neurologic complaints. She has had no recent fevers or illnesses. She has a good appetite and denies weight loss. She has no chest pain or shortness of breath. She denies any nausea, vomiting, constipation, or diarrhea. She has no urinary complaints. Patient offers no further specific complaints today.  REVIEW OF SYSTEMS:   Review of Systems  Constitutional: Negative.  Negative for fever, malaise/fatigue and weight loss.  Respiratory: Negative.  Negative for cough and shortness of breath.   Cardiovascular: Negative.  Negative for chest pain and leg swelling.  Gastrointestinal: Negative.  Negative for abdominal pain.  Genitourinary: Negative.   Musculoskeletal: Positive for joint pain.  Neurological: Positive for sensory change. Negative for weakness.       With pain  Psychiatric/Behavioral: Negative.  The patient is not nervous/anxious.     As per HPI. Otherwise, a complete review of systems is negative.  PAST MEDICAL HISTORY: Past Medical History:  Diagnosis Date  . Allergy   . Arthritis   . Breast CA ([place])   . Breast cancer ([place]) 2013   left breast lumpectomy with chemo and rad tx  . COPD (chronic obstructive pulmonary disease) ([place])   . Headache    occasional - thinks it is from BP meds  . Hypertension   . Insomnia   . Metabolic syndrome   . Obesity    . Peripheral neuropathy ([place])     PAST SURGICAL HISTORY: Past Surgical History:  Procedure Laterality Date  . BLADDER SURGERY  2007  . BREAST LUMPECTOMY  2013  . BREAST SURGERY    . COLONOSCOPY WITH PROPOFOL N/A 04/04/2015   Procedure: COLONOSCOPY WITH PROPOFOL;  Surgeon: [name], MD;  Location: [place];  Service: Endoscopy;  Laterality: N/A;  . POLYPECTOMY  04/04/2015   Procedure: POLYPECTOMY;  Surgeon: [name], MD;  Location: [facility];  Service: Endoscopy;;    FAMILY HISTORY Family History  Problem Relation Age of Onset  . Heart disease Mother   . Diabetes Father   . Hypertension Father        ADVANCED DIRECTIVES:    HEALTH MAINTENANCE: Social History  Substance Use Topics  . Smoking status: Current Every Day Smoker    Packs/day: 0.25    Years: 35.00    Types: Cigarettes, E-cigarettes  . Smokeless tobacco: Never Used  . Alcohol use No     Allergies  Allergen Reactions  . Ace Inhibitors Cough    Current Outpatient Prescriptions  Medication Sig Dispense Refill  . anastrozole (ARIMIDEX) 1 MG tablet Take 1 tablet (1 mg total) by mouth daily. 90 tablet 1  . aspirin 81 MG tablet Take 1 tablet by mouth daily.    . Coenzyme Q10 100 MG capsule Take 1 capsule (100 mg total) by mouth daily. 30 capsule 5  . cyclobenzaprine (FLEXERIL) 10 MG tablet Take 0.5-1 tablets (5-10 mg total) by mouth at bedtime. 30 tablet 2  .  fluticasone (FLONASE) 50 MCG/ACT nasal spray Place 2 sprays into the nose as needed.    . gabapentin (NEURONTIN) 300 MG capsule Take 1 capsule (300 mg total) by mouth at bedtime. '200mg'$  morning and noon, 300 mg at bedtime - may increase to 300 mg TID PRN 30 capsule 2  . hydrochlorothiazide (HYDRODIURIL) 12.5 MG tablet Take 1 tablet (12.5 mg total) by mouth daily. 30 tablet 2  . Ipratropium-Albuterol (COMBIVENT RESPIMAT) 20-100 MCG/ACT AERS respimat Inhale 1 puff into the lungs as needed.    . loratadine (CLARITIN) 10 MG tablet Take  1 tablet (10 mg total) by mouth as needed for allergies. 30 tablet 2  . Magnesium 200 MG TABS Take 1 tablet (200 mg total) by mouth daily. 30 each 5  . meloxicam (MOBIC) 15 MG tablet Take 1 tablet (15 mg total) by mouth daily. 30 tablet 0  . ranitidine (ZANTAC) 300 MG tablet Take 1 tablet (300 mg total) by mouth 2 (two) times daily. 60 tablet 2  . zolpidem (AMBIEN) 10 MG tablet Take 0.5-1 tablets (5-10 mg total) by mouth at bedtime. 30 tablet 2   No current facility-administered medications for this visit.     OBJECTIVE: There were no vitals filed for this visit.   There is no height or weight on file to calculate BMI.    ECOG FS:0 - Asymptomatic  General: Well-developed, well-nourished, no acute distress. Eyes: Pink conjunctiva, anicteric sclera. Breasts: Exam recently completed by other provider. Lungs: Clear to auscultation bilaterally. Heart: Regular rate and rhythm. No rubs, murmurs, or gallops. Abdomen: Soft, nontender, nondistended. No organomegaly noted, normoactive bowel sounds. Musculoskeletal: No edema, cyanosis, or clubbing. Neuro: Alert, answering all questions appropriately. Cranial nerves grossly intact. Skin: No rashes or petechiae noted. Psych: Normal affect.   LAB RESULTS:  Lab Results  Component Value Date   NA 140 03/19/2016   K 4.1 03/19/2016   CL 107 03/19/2016   CO2 26 03/19/2016   GLUCOSE 94 03/19/2016   BUN 12 03/19/2016   CREATININE 0.76 03/19/2016   CALCIUM 9.6 03/19/2016   PROT 7.1 03/19/2016   ALBUMIN 4.2 03/19/2016   AST 16 03/19/2016   ALT 15 03/19/2016   ALKPHOS 111 03/19/2016   BILITOT 0.3 03/19/2016   GFRNONAA >89 03/19/2016   GFRAA >89 03/19/2016    Lab Results  Component Value Date   WBC 8.5 03/19/2016   NEUTROABS 5,015 03/19/2016   HGB 13.1 03/19/2016   HCT 39.1 03/19/2016   MCV 81.6 03/19/2016   PLT 324 03/19/2016     STUDIES: No results found.  ASSESSMENT: Pathologic stage IIb ER/PR+, HER-2 negative carcinoma of the  upper inner quadrant of the left breast.   PLAN:    1.  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 Continue 5 years of anastrozole completing in September of 2018. Her most recent mammogram on October 27, 2015 was reported as BI-RADS 2. Repeat in April 2018. Return to clinic in 6 months for further evaluation. 2. Osteopenia: Bone mineral density on October 27, 2015 was reported -2.1 and essentially unchanged. Repeat in one year in April 2018.  Continue calcium and vitamin D supplementation.   3. Peripheral neuropathy: Continue current dose of gabapentin. Have also suggested switching to Lyrica, but patient declined at this time. Consider neurology referral.  Patient expressed understanding and was in agreement with this plan. She also understands that She can call clinic at any time  with any questions, concerns, or complaints.    Lloyd Huger, MD   11/05/2016 10:01 PM

## 2016-11-06 ENCOUNTER — Inpatient Hospital Stay: Payer: 59 | Admitting: Oncology

## 2016-11-15 ENCOUNTER — Other Ambulatory Visit: Payer: Self-pay | Admitting: *Deleted

## 2016-11-15 MED ORDER — ANASTROZOLE 1 MG PO TABS: 1.0000 mg | ORAL_TABLET | Freq: Every day | ORAL | 0 refills | Status: DC

## 2016-11-16 ENCOUNTER — Telehealth: Payer: Self-pay | Admitting: Family Medicine

## 2016-11-16 NOTE — Telephone Encounter (Signed)
Pt would like to try the patch to quit smoking. Pt was advised she may need to schedule and appt.

## 2016-11-16 NOTE — Telephone Encounter (Signed)
Patches are OTC, does not need a rx, but she is due for follow up next month and we can talk about other options

## 2016-11-27 ENCOUNTER — Encounter: Payer: Self-pay | Admitting: Family Medicine

## 2016-11-27 ENCOUNTER — Ambulatory Visit (INDEPENDENT_AMBULATORY_CARE_PROVIDER_SITE_OTHER): Payer: 59 | Admitting: Family Medicine

## 2016-11-27 VITALS — BP 124/78 | HR 96 | Temp 98.5°F | Resp 16 | Ht 64.0 in | Wt 202.6 lb

## 2016-11-27 DIAGNOSIS — E782 Mixed hyperlipidemia: Secondary | ICD-10-CM | POA: Diagnosis not present

## 2016-11-27 DIAGNOSIS — I10 Essential (primary) hypertension: Secondary | ICD-10-CM | POA: Diagnosis not present

## 2016-11-27 DIAGNOSIS — M1711 Unilateral primary osteoarthritis, right knee: Secondary | ICD-10-CM | POA: Diagnosis not present

## 2016-11-27 DIAGNOSIS — G63 Polyneuropathy in diseases classified elsewhere: Secondary | ICD-10-CM

## 2016-11-27 DIAGNOSIS — G609 Hereditary and idiopathic neuropathy, unspecified: Secondary | ICD-10-CM

## 2016-11-27 DIAGNOSIS — IMO0002 Reserved for concepts with insufficient information to code with codable children: Secondary | ICD-10-CM

## 2016-11-27 DIAGNOSIS — E8881 Metabolic syndrome: Secondary | ICD-10-CM | POA: Diagnosis not present

## 2016-11-27 DIAGNOSIS — J449 Chronic obstructive pulmonary disease, unspecified: Secondary | ICD-10-CM | POA: Diagnosis not present

## 2016-11-27 DIAGNOSIS — Z720 Tobacco use: Secondary | ICD-10-CM

## 2016-11-27 DIAGNOSIS — C50212 Malignant neoplasm of upper-inner quadrant of left female breast: Secondary | ICD-10-CM

## 2016-11-27 DIAGNOSIS — G4700 Insomnia, unspecified: Secondary | ICD-10-CM | POA: Diagnosis not present

## 2016-11-27 MED ORDER — HYDROCHLOROTHIAZIDE 12.5 MG PO TABS: 12.5000 mg | ORAL_TABLET | Freq: Every day | ORAL | 2 refills | Status: DC

## 2016-11-27 MED ORDER — ZOLPIDEM TARTRATE 10 MG PO TABS: 5.0000 mg | ORAL_TABLET | Freq: Every day | ORAL | 2 refills | Status: DC

## 2016-11-27 MED ORDER — GABAPENTIN 300 MG PO CAPS: 300.0000 mg | ORAL_CAPSULE | Freq: Three times a day (TID) | ORAL | 2 refills | Status: DC

## 2016-11-27 MED ORDER — NICOTINE 14 MG/24HR TD PT24: 14.0000 mg | MEDICATED_PATCH | Freq: Every day | TRANSDERMAL | 0 refills | Status: DC

## 2016-11-27 MED ORDER — NICOTINE 21 MG/24HR TD PT24: 21.0000 mg | MEDICATED_PATCH | Freq: Every day | TRANSDERMAL | 0 refills | Status: DC

## 2016-11-27 MED ORDER — NICOTINE 7 MG/24HR TD PT24: 7.0000 mg | MEDICATED_PATCH | Freq: Every day | TRANSDERMAL | 0 refills | Status: DC

## 2016-11-27 NOTE — Progress Notes (Signed)
Name: Brenda Jones   MRN: [id number]    DOB: [birth year]   Date:11/27/2016       Progress Note  Subjective  Chief Complaint  Chief Complaint  Patient presents with  . Hypertension  . Insomnia  . 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 Seen by Dr. Grayland Ormond, currently only calcium plus D, she had repeat bone density in April 2017 and is stable, continue supplements. Last vitamin D level was at goal   Insomnia: medication helps her fall and stay asleep, but wakes up at 2 am to go to work. She has been getting about 5-6 hours of sleep now, because of her part time job.  Discussed FDA and the need to try going down to 5 mg qhs but she states it took her too long to fall asleep with lower dose. She denies side effects, she is aware that she should not take Flexeril and Ambien at the same time  COPD: she is still smoking, she is up to 2 packs daily and has occasional morning cough. She would like to try nicotine patch to quit smoking. No SOB with activity,  no wheezing .  Muscles Spasms: got much worse with cancer therapy - in 2013 and takes Cyclobenzaprine to control symptoms. It can be on her trunk or legs, she has tried magnesium and co Q 10 without improvement of symptoms.   Breast cancer left: seeing Dr. Grayland Ormond, s/p lumpectomy still on oral suppressive medication, mammogram is due, and she will re-scheduled - missed appointment.  Still taking Anastrozole  Peripheral Neuropathy: still has daily pain on both legs and feet also also tips of fingers. She is on Gabapentin and has been able to take it during the day ow,   she has not noticed much of an improvement and discussed switching to Lyrica, but she is worried about cost, she does not want to see neurologist She states pain right now is 7/10 now, usually 6-8/10   Obesity: she  decreased drinking sodas, used to drink 5-6 daily and is down to 1 daily sometimes [employer], but she gained weight since last visit, because she has an aunt visiting from [place] and is cooking for her. Total of 15 lbs since she cut down on soda intake . No polyphagia, occasional polydipsia , but she has polyuria - she thinks related to HCTZ  Patient Active Problem List   Diagnosis Date Noted  . Osteoarthritis of right knee 08/29/2016  . Osteopenia due to cancer therapy 04/18/2015  . GERD (gastroesophageal reflux disease) 04/18/2015  . Muscle spasm 04/18/2015  . Benign neoplasm of descending colon   . Rectal polyp   . Cervical high risk HPV (human papillomavirus) test positive 03/22/2015  . Benign essential HTN 01/08/2015  . Insomnia, persistent 01/08/2015  . COPD, mild ([place]) 01/08/2015  . HLD (hyperlipidemia) 01/08/2015  . Dysmetabolic syndrome 32/95/1884  . Adult BMI 30+ 01/08/2015  . Seasonal allergic rhinitis 01/08/2015  . Secondary peripheral neuropathy 01/08/2015  . Arthralgia of multiple joints 01/08/2015  . Primary cancer of upper inner quadrant of left female breast ([place]) 02/21/2011    Past Surgical History:  Procedure Laterality Date  . BLADDER SURGERY  2007  . BREAST LUMPECTOMY  2013  . BREAST SURGERY    . COLONOSCOPY WITH PROPOFOL N/A 04/04/2015   Procedure: COLONOSCOPY WITH  PROPOFOL;  Surgeon: [name], MD;  Location: [facility];  Service: Endoscopy;  Laterality: N/A;  . POLYPECTOMY  04/04/2015   Procedure: POLYPECTOMY;  Surgeon: [name], MD;  Location: [facility];  Service: Endoscopy;;    Family History  Problem Relation Age of Onset  . Heart disease Mother   . Diabetes Father   . Hypertension Father     Social History   Social History  . Marital status: Single    Spouse name: N/A  . Number of children: N/A  . Years of education: N/A   Occupational History  . Not on file.   Social History Main Topics  . Smoking status: Current  Every Day Smoker    Packs/day: 0.25    Years: 35.00    Types: Cigarettes, E-cigarettes  . Smokeless tobacco: Never Used  . Alcohol use No  . Drug use: No  . Sexual activity: No   Other Topics Concern  . Not on file   Social History Narrative  . No narrative on file     Current Outpatient Prescriptions:  .  anastrozole (ARIMIDEX) 1 MG tablet, Take 1 tablet (1 mg total) by mouth daily., Disp: 34 tablet, Rfl: 0 .  aspirin 81 MG tablet, Take 1 tablet by mouth daily., Disp: , Rfl:  .  Coenzyme Q10 100 MG capsule, Take 1 capsule (100 mg total) by mouth daily., Disp: 30 capsule, Rfl: 5 .  cyclobenzaprine (FLEXERIL) 10 MG tablet, Take 0.5-1 tablets (5-10 mg total) by mouth at bedtime., Disp: 30 tablet, Rfl: 2 .  fluticasone (FLONASE) 50 MCG/ACT nasal spray, Place 2 sprays into the nose as needed., Disp: , Rfl:  .  gabapentin (NEURONTIN) 300 MG capsule, Take 1 capsule (300 mg total) by mouth 3 (three) times daily. 200mg  morning and noon, 300 mg at bedtime - may increase to 300 mg TID PRN, Disp: 90 capsule, Rfl: 2 .  hydrochlorothiazide (HYDRODIURIL) 12.5 MG tablet, Take 1 tablet (12.5 mg total) by mouth daily., Disp: 30 tablet, Rfl: 2 .  Ipratropium-Albuterol (COMBIVENT RESPIMAT) 20-100 MCG/ACT AERS respimat, Inhale 1 puff into the lungs as needed., Disp: , Rfl:  .  loratadine (CLARITIN) 10 MG tablet, Take 1 tablet (10 mg total) by mouth as needed for allergies., Disp: 30 tablet, Rfl: 2 .  Magnesium 200 MG TABS, Take 1 tablet (200 mg total) by mouth daily., Disp: 30 each, Rfl: 5 .  ranitidine (ZANTAC) 300 MG tablet, Take 1 tablet (300 mg total) by mouth 2 (two) times daily., Disp: 60 tablet, Rfl: 2 .  zolpidem (AMBIEN) 10 MG tablet, Take 0.5-1 tablets (5-10 mg total) by mouth at bedtime., Disp: 30 tablet, Rfl: 2  Allergies  Allergen Reactions  . Ace Inhibitors Cough     ROS  Constitutional: Negative for fever, positive for mild  weight change.  Respiratory: Positive  for cough  occasionally productive but no shortness of breath.   Cardiovascular: Negative for chest pain or palpitations.  Gastrointestinal: Negative for abdominal pain, no bowel changes.  Musculoskeletal: Negative for gait problem or joint swelling.  Skin: Negative for rash.  Neurological: Negative for dizziness, positive for mild intermittent  headache.  No other specific complaints in a complete review of systems (except as listed in HPI above).  Objective  Vitals:   11/27/16 0905  BP: 124/78  Pulse: 96  Resp: 16  Temp: 98.5 F (36.9 C)  SpO2: 98%  Weight: 202 lb 9 oz (91.9 kg)  Height: 5\' 4"  (1.626 m)  Body mass index is 34.77 kg/m.  Physical Exam  Constitutional: Patient appears well-developed and well-nourished. Obese  No distress.  HEENT: head atraumatic, normocephalic, pupils equal and reactive to light,  neck supple, throat within normal limits Cardiovascular: Normal rate, regular rhythm and normal heart sounds.  No murmur heard. No BLE edema. Pulmonary/Chest: Effort normal and breath sounds normal. No respiratory distress. Abdominal: Soft.  There is no tenderness. Psychiatric: Patient has a normal mood and affect. behavior is normal. Judgment and thought content normal.  PHQ2/9: Depression screen [facility] 2/9 08/29/2016 05/28/2016 02/24/2016 11/08/2015 08/04/2015  Decreased Interest 0 0 0 0 0  Down, Depressed, Hopeless 0 0 0 0 0  PHQ - 2 Score 0 0 0 0 0     Fall Risk: Fall Risk  08/29/2016 05/28/2016 02/24/2016 11/08/2015 08/04/2015  Falls in the past year? No No No No No     Assessment & Plan  1. Primary osteoarthritis of right knee  Doing better, effusion resolved  2. Benign essential HTN  Well controlled - hydrochlorothiazide (HYDRODIURIL) 12.5 MG tablet; Take 1 tablet (12.5 mg total) by mouth daily.  Dispense: 30 tablet; Refill: 2  3. Mixed hyperlipidemia  Continue life style modification   4. Dysmetabolic syndrome  Discussed life style modification  5. COPD, mild  ([place])  She is ready to quit smoking, she is currently smoking almost 2 packs daily, she failed oral medications, she is wants to try the nicotine patch, explained she cannot smoke while using the patch and to titrate down the dose from 21 to 14 to 7   6. Insomnia, persistent  Explained again FDA guidelines, she states she cannot sleep when she only takes 5 mg.  - zolpidem (AMBIEN) 10 MG tablet; Take 0.5-1 tablets (5-10 mg total) by mouth at bedtime.  Dispense: 30 tablet; Refill: 2  7. Primary cancer of upper inner quadrant of left female breast [facility])  Still seeing Dr. Grayland Ormond, has appointment on the 31 st of May, she is still on Arimidex   8. Secondary peripheral neuropathy  - gabapentin (NEURONTIN) 300 MG capsule; Take 1 capsule (300 mg total) by mouth 3 (three) times daily.  may increase to 300 mg TID PRN  Dispense: 90 capsule; Refill: 2

## 2016-11-27 NOTE — Patient Instructions (Signed)
Coping with Quitting Smoking Quitting smoking is a physical and mental challenge. You will face cravings, withdrawal symptoms, and temptation. Before quitting, work with your health care provider to make a plan that can help you cope. Preparation can help you quit and keep you from giving in. How can I cope with cravings? Cravings usually last for 5-10 minutes. If you get through it, the craving will pass. Consider taking the following actions to help you cope with cravings: Keep your mouth busy: Chew sugar-free gum. Suck on hard candies or a straw. Brush your teeth. Keep your hands and body busy: Immediately change to a different activity when you feel a craving. Squeeze or play with a ball. Do an activity or a hobby, like making bead jewelry, practicing needlepoint, or working with wood. Mix up your normal routine. Take a short exercise break. Go for a quick walk or run up and down stairs. Spend time in public places where smoking is not allowed. Focus on doing something kind or helpful for someone else. Call a friend or family member to talk during a craving. Join a support group. Call a quit line, such as 1-800-QUIT-NOW. Talk with your health care provider about medicines that might help you cope with cravings and make quitting easier for you. How can I deal with withdrawal symptoms? Your body may experience negative effects as it tries to get used to not having nicotine in the system. These effects are called withdrawal symptoms. They may include: Feeling hungrier than normal. Trouble concentrating. Irritability. Trouble sleeping. Feeling depressed. Restlessness and agitation. Craving a cigarette.  To manage withdrawal symptoms: Avoid places, people, and activities that trigger your cravings. Remember why you want to quit. Get plenty of sleep. Avoid coffee and other caffeinated drinks. These may worsen some of your symptoms. How can I handle social situations? Social  situations can be difficult when you are quitting smoking, especially in the first few weeks. To manage this, you can: Avoid parties, bars, and other social situations where people might be smoking. Avoid alcohol. Leave right away if you have the urge to smoke. Explain to your family and friends that you are quitting smoking. Ask for understanding and support. Plan activities with friends or family where smoking is not an option. What are some ways I can cope with stress? Wanting to smoke may cause stress, and stress can make you want to smoke. Find ways to manage your stress. Relaxation techniques can help. For example: Breathe slowly and deeply, in through your nose and out through your mouth. Listen to soothing, relaxing music. Talk with a family member or friend about your stress. Light a candle. Soak in a bath or take a shower. Think about a peaceful place. What are some ways I can prevent weight gain? Be aware that many people gain weight after they quit smoking. However, not everyone does. To keep from gaining weight, have a plan in place before you quit and stick to the plan after you quit. Your plan should include: Having healthy snacks. When you have a craving, it may help to: Eat plain popcorn, crunchy carrots, celery, or other cut vegetables. Chew sugar-free gum. Changing how you eat: Eat small portion sizes at meals. Eat 4-6 small meals throughout the day instead of 1-2 large meals a day. Be mindful when you eat. Do not watch television or do other things that might distract you as you eat. Exercising regularly: Make time to exercise each day. If you do not have time  for a long workout, do short bouts of exercise for 5-10 minutes several times a day. Do some form of strengthening exercise, like weight lifting, and some form of aerobic exercise, like running or swimming. Drinking plenty of water or other low-calorie or no-calorie drinks. Drink 6-8 glasses of water daily, or as  much as instructed by your health care provider. Summary Quitting smoking is a physical and mental challenge. You will face cravings, withdrawal symptoms, and temptation to smoke again. Preparation can help you as you go through these challenges. You can cope with cravings by keeping your mouth busy (such as by chewing gum), keeping your body and hands busy, and making calls to family, friends, or a helpline for people who want to quit smoking. You can cope with withdrawal symptoms by avoiding places where people smoke, avoiding drinks with caffeine, and getting plenty of rest. Ask your health care provider about the different ways to prevent weight gain, avoid stress, and handle social situations. This information is not intended to replace advice given to you by your health care provider. Make sure you discuss any questions you have with your health care provider. Document Released: 07/06/2016 Document Revised: 07/06/2016 Document Reviewed: 07/06/2016 Elsevier Interactive Patient Education  2017 [employer]. Steps to Quit Smoking Smoking tobacco can be harmful to your health and can affect almost every organ in your body. Smoking puts you, and those around you, at risk for developing many serious chronic diseases. Quitting smoking is difficult, but it is one of the best things that you can do for your health. It is never too late to quit. What are the benefits of quitting smoking? When you quit smoking, you lower your risk of developing serious diseases and conditions, such as:  Lung cancer or lung disease, such as COPD.  Heart disease.  Stroke.  Heart attack.  Infertility.  Osteoporosis and bone fractures. Additionally, symptoms such as coughing, wheezing, and shortness of breath may get better when you quit. You may also find that you get sick less often because your body is stronger at fighting off colds and infections. If you are pregnant, quitting smoking can help to reduce your  chances of having a baby of low birth weight. How do I get ready to quit? When you decide to quit smoking, create a plan to make sure that you are successful. Before you quit:  Pick a date to quit. Set a date within the next two weeks to give you time to prepare.  Write down the reasons why you are quitting. Keep this list in places where you will see it often, such as on your bathroom mirror or in your car or wallet.  Identify the people, places, things, and activities that make you want to smoke (triggers) and avoid them. Make sure to take these actions:  Throw away all cigarettes at home, at work, and in your car.  Throw away smoking accessories, such as Scientist, research (medical).  Clean your car and make sure to empty the ashtray.  Clean your home, including curtains and carpets.  Tell your family, friends, and coworkers that you are quitting. Support from your loved ones can make quitting easier.  Talk with your health care provider about your options for quitting smoking.  Find out what treatment options are covered by your health insurance. What strategies can I use to quit smoking? Talk with your healthcare provider about different strategies to quit smoking. Some strategies include:  Quitting smoking altogether instead of gradually  lessening how much you smoke over a period of time. Research shows that quitting "cold [place]" is more successful than gradually quitting.  Attending in-person counseling to help you build problem-solving skills. You are more likely to have success in quitting if you attend several counseling sessions. Even short sessions of 10 minutes can be effective.  Finding resources and support systems that can help you to quit smoking and remain smoke-free after you quit. These resources are most helpful when you use them often. They can include:  Online chats with a Social worker.  Telephone quitlines.  Printed Furniture conservator/restorer.  Support groups or group  counseling.  Text messaging programs.  Mobile phone applications.  Taking medicines to help you quit smoking. (If you are pregnant or breastfeeding, talk with your health care provider first.) Some medicines contain nicotine and some do not. Both types of medicines help with cravings, but the medicines that include nicotine help to relieve withdrawal symptoms. Your health care provider may recommend:  Nicotine patches, gum, or lozenges.  Nicotine inhalers or sprays.  Non-nicotine medicine that is taken by mouth. Talk with your health care provider about combining strategies, such as taking medicines while you are also receiving in-person counseling. Using these two strategies together makes you more likely to succeed in quitting than if you used either strategy on its own. If you are pregnant or breastfeeding, talk with your health care provider about finding counseling or other support strategies to quit smoking. Do not take medicine to help you quit smoking unless told to do so by your health care provider. What things can I do to make it easier to quit? Quitting smoking might feel overwhelming at first, but there is a lot that you can do to make it easier. Take these important actions:  Reach out to your family and friends and ask that they support and encourage you during this time. Call telephone quitlines, reach out to support groups, or work with a counselor for support.  Ask people who smoke to avoid smoking around you.  Avoid places that trigger you to smoke, such as bars, parties, or smoke-break areas at work.  Spend time around people who do not smoke.  Lessen stress in your life, because stress can be a smoking trigger for some people. To lessen stress, try:  Exercising regularly.  Deep-breathing exercises.  Yoga.  Meditating.  Performing a body scan. This involves closing your eyes, scanning your body from head to toe, and noticing which parts of your body are  particularly tense. Purposefully relax the muscles in those areas.  Download or purchase mobile phone or tablet apps (applications) that can help you stick to your quit plan by providing reminders, tips, and encouragement. There are many free apps, such as QuitGuide from the [employer] Office manager for Disease Control and Prevention). You can find other support for quitting smoking (smoking cessation) through smokefree.gov and other websites. How will I feel when I quit smoking? Within the first 24 hours of quitting smoking, you may start to feel some withdrawal symptoms. These symptoms are usually most noticeable 2-3 days after quitting, but they usually do not last beyond 2-3 weeks. Changes or symptoms that you might experience include:  Mood swings.  Restlessness, anxiety, or irritation.  Difficulty concentrating.  Dizziness.  Strong cravings for sugary foods in addition to nicotine.  Mild weight gain.  Constipation.  Nausea.  Coughing or a sore throat.  Changes in how your medicines work in your body.  A depressed mood.  Difficulty sleeping (insomnia). After the first 2-3 weeks of quitting, you may start to notice more positive results, such as:  Improved sense of smell and taste.  Decreased coughing and sore throat.  Slower heart rate.  Lower blood pressure.  Clearer skin.  The ability to breathe more easily.  Fewer sick days. Quitting smoking is very challenging for most people. Do not get discouraged if you are not successful the first time. Some people need to make many attempts to quit before they achieve long-term success. Do your best to stick to your quit plan, and talk with your health care provider if you have any questions or concerns. This information is not intended to replace advice given to you by your health care provider. Make sure you discuss any questions you have with your health care provider. Document Released: 07/03/2001 Document Revised: 03/06/2016  Document Reviewed: 11/23/2014 Elsevier Interactive Patient Education  2017 [employer].

## 2016-12-20 ENCOUNTER — Inpatient Hospital Stay: Payer: 59 | Admitting: Oncology

## 2016-12-26 ENCOUNTER — Encounter: Payer: Self-pay | Admitting: Family Medicine

## 2016-12-26 ENCOUNTER — Other Ambulatory Visit: Payer: Self-pay | Admitting: Family Medicine

## 2016-12-26 ENCOUNTER — Other Ambulatory Visit: Payer: Self-pay | Admitting: Oncology

## 2016-12-26 DIAGNOSIS — Z720 Tobacco use: Secondary | ICD-10-CM

## 2016-12-26 MED ORDER — ANASTROZOLE 1 MG PO TABS: 1.0000 mg | ORAL_TABLET | Freq: Every day | ORAL | 0 refills | Status: DC

## 2017-01-17 ENCOUNTER — Ambulatory Visit
Admission: RE | Admit: 2017-01-17 | Discharge: 2017-01-17 | Disposition: A | Discharge status: Home / Self Care | Payer: 59 | Source: Ambulatory Visit | Attending: Oncology | Admitting: Oncology

## 2017-01-17 DIAGNOSIS — Z13820 Encounter for screening for osteoporosis: Secondary | ICD-10-CM | POA: Diagnosis not present

## 2017-01-17 DIAGNOSIS — Z853 Personal history of malignant neoplasm of breast: Secondary | ICD-10-CM | POA: Diagnosis not present

## 2017-01-17 DIAGNOSIS — M8588 Other specified disorders of bone density and structure, other site: Secondary | ICD-10-CM | POA: Diagnosis not present

## 2017-01-17 DIAGNOSIS — C50212 Malignant neoplasm of upper-inner quadrant of left female breast: Secondary | ICD-10-CM

## 2017-01-17 DIAGNOSIS — Z9889 Other specified postprocedural states: Secondary | ICD-10-CM | POA: Diagnosis not present

## 2017-01-17 DIAGNOSIS — F17200 Nicotine dependence, unspecified, uncomplicated: Secondary | ICD-10-CM | POA: Diagnosis not present

## 2017-01-21 ENCOUNTER — Ambulatory Visit: Payer: 59 | Admitting: Oncology

## 2017-01-29 NOTE — Progress Notes (Signed)
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  INTERVAL HISTORY: Patient returns to clinic today for routine 6 month evaluation.  She continues to have peripheral neuropathy that is unchanged.  She is tolerating anastrozole well without significant side effects. She does admit to occasional hot flashes that are tolerable. She has no other neurologic complaints. She has had no recent fevers or illnesses. She has a good appetite and denies weight loss. She has no chest pain or shortness of breath. She denies any nausea, vomiting, constipation, or diarrhea. She has no urinary complaints. Patient offers no further specific complaints today.  REVIEW OF SYSTEMS:   Review of Systems  Constitutional: Negative.  Negative for fever, malaise/fatigue and weight loss.  Respiratory: Negative.  Negative for cough and shortness of breath.   Cardiovascular: Negative.  Negative for chest pain and leg swelling.  Gastrointestinal: Negative.  Negative for abdominal pain.  Genitourinary: Negative.   Musculoskeletal: Positive for joint pain.  Neurological: Positive for sensory change. Negative for weakness.  Psychiatric/Behavioral: Negative.  The patient is not nervous/anxious.     As per HPI. Otherwise, a complete review of systems is negative.  PAST MEDICAL HISTORY: Past Medical History:  Diagnosis Date  . Allergy   . Arthritis   . Breast CA ([place])   . Breast cancer ([place]) 2013   left breast lumpectomy with chemo and rad tx  . COPD (chronic obstructive pulmonary disease) ([place])   . Headache    occasional - thinks it is from BP meds  . Hypertension   . Insomnia   . Metabolic syndrome   . Obesity   . Peripheral  neuropathy     PAST SURGICAL HISTORY: Past Surgical History:  Procedure Laterality Date  . BLADDER SURGERY  2007  . BREAST BIOPSY Left 2013   invasive mammary carcinoma  . BREAST LUMPECTOMY Left 2013   invasive mammary carcinoma with 2 positive lymph nodes. Margins were clear.   Marland Kitchen BREAST SURGERY    . COLONOSCOPY WITH PROPOFOL N/A 04/04/2015   Procedure: COLONOSCOPY WITH PROPOFOL;  Surgeon: [name], MD;  Location: [facility];  Service: Endoscopy;  Laterality: N/A;  . POLYPECTOMY  04/04/2015   Procedure: POLYPECTOMY;  Surgeon: [name], MD;  Location: [facility];  Service: Endoscopy;;    FAMILY HISTORY Family History  Problem Relation Age of Onset  . Heart disease Mother   . Diabetes Father   . Hypertension Father        ADVANCED DIRECTIVES:    HEALTH MAINTENANCE: Social History  Substance Use Topics  . Smoking status: Current Every Day Smoker    Packs/day: 2.00    Years: 36.00    Types: Cigarettes, E-cigarettes  . Smokeless tobacco: Never Used  . Alcohol use No     Allergies  Allergen Reactions  . Ace Inhibitors Cough    Current Outpatient Prescriptions  Medication Sig Dispense Refill  . anastrozole (ARIMIDEX) 1 MG tablet Take 1 tablet (1 mg total) by mouth daily. Please be advised that you MUST keep your appt 01/21/17 or Dr Grayland Ormond will NOT refill this again 28 tablet 0  . aspirin 81 MG tablet Take 1 tablet by mouth daily.    . Coenzyme Q10 100 MG capsule  Take 1 capsule (100 mg total) by mouth daily. 30 capsule 5  . cyclobenzaprine (FLEXERIL) 10 MG tablet Take 0.5-1 tablets (5-10 mg total) by mouth at bedtime. 30 tablet 2  . fluticasone (FLONASE) 50 MCG/ACT nasal spray Place 2 sprays into the nose as needed.    . gabapentin (NEURONTIN) 300 MG capsule Take 1 capsule (300 mg total) by mouth 3 (three) times daily. 90 capsule 2  . hydrochlorothiazide (HYDRODIURIL) 12.5 MG tablet Take 1 tablet (12.5 mg total) by mouth daily. 30 tablet 2  .  Ipratropium-Albuterol (COMBIVENT RESPIMAT) 20-100 MCG/ACT AERS respimat Inhale 1 puff into the lungs as needed.    . loratadine (CLARITIN) 10 MG tablet Take 1 tablet (10 mg total) by mouth as needed for allergies. 30 tablet 2  . Magnesium 200 MG TABS Take 1 tablet (200 mg total) by mouth daily. 30 each 5  . nicotine (NICODERM CQ - DOSED IN MG/24 HOURS) 21 mg/24hr patch PLACE 1 PATCH ONTO THE SKIN DAILY 28 patch 2  . nicotine (NICODERM CQ - DOSED IN MG/24 HR) 7 mg/24hr patch Place 1 patch (7 mg total) onto the skin daily. 28 patch 0  . nicotine (NICOTINE STEP 2) 14 mg/24hr patch Place 1 patch (14 mg total) onto the skin daily. 28 patch 0  . ranitidine (ZANTAC) 300 MG tablet Take 1 tablet (300 mg total) by mouth 2 (two) times daily. 60 tablet 2  . zolpidem (AMBIEN) 10 MG tablet Take 0.5-1 tablets (5-10 mg total) by mouth at bedtime. 30 tablet 2   No current facility-administered medications for this visit.     OBJECTIVE: Vitals:   01/31/17 0946  BP: (!) 132/91  Pulse: 90  Resp: 20  Temp: (!) 97.4 F (36.3 C)     Body mass index is 35.02 kg/m.    ECOG FS:0 - Asymptomatic  General: Well-developed, well-nourished, no acute distress. Eyes: Pink conjunctiva, anicteric sclera. Breasts: Patient requested exam be deferred today. Lungs: Clear to auscultation bilaterally. Heart: Regular rate and rhythm. No rubs, murmurs, or gallops. Abdomen: Soft, nontender, nondistended. No organomegaly noted, normoactive bowel sounds. Musculoskeletal: No edema, cyanosis, or clubbing. Neuro: Alert, answering all questions appropriately. Cranial nerves grossly intact. Skin: No rashes or petechiae noted. Psych: Normal affect.   LAB RESULTS:  Lab Results  Component Value Date   NA 140 03/19/2016   K 4.1 03/19/2016   CL 107 03/19/2016   CO2 26 03/19/2016   GLUCOSE 94 03/19/2016   BUN 12 03/19/2016   CREATININE 0.76 03/19/2016   CALCIUM 9.6 03/19/2016   PROT 7.1 03/19/2016   ALBUMIN 4.2 03/19/2016    AST 16 03/19/2016   ALT 15 03/19/2016   ALKPHOS 111 03/19/2016   BILITOT 0.3 03/19/2016   GFRNONAA >89 03/19/2016   GFRAA >89 03/19/2016    Lab Results  Component Value Date   WBC 8.5 03/19/2016   NEUTROABS 5,015 03/19/2016   HGB 13.1 03/19/2016   HCT 39.1 03/19/2016   MCV 81.6 03/19/2016   PLT 324 03/19/2016     STUDIES: Dg Bone Density  Result Date: 01/17/2017 EXAM: DUAL X-RAY ABSORPTIOMETRY (DXA) FOR BONE MINERAL DENSITY IMPRESSION: Dear Dr. Grayland Ormond, Your patient Rosmarie Esquibel completed a BMD test on 01/17/2017 using the [place] (analysis version: 14.10) manufactured by [employer]. The following summarizes the results of our evaluation. PATIENT BIOGRAPHICAL: Name: Trudee, Chirino Patient ID: [id number] Birth Date: January 22, 1966 Height: 64.0 in. Gender: Female Exam Date: 01/17/2017 Weight: 208.4 lbs. Indications: High Risk Meds, History  of Breast Cancer, Postmenopausal, Previous Chemo and Radiation, Tobacco User (Current Smoker) Fractures: Treatments: anastrozole, claritin, Flonase, gabapentin, zantac ASSESSMENT: The BMD measured at AP Spine L2-L4 is 0.988 g/cm2 with a T-score of -1.8. This patient is considered OSTEOPENIC according to [facility]) criteria. L1 was excluded due to degenerative changes. Site Region Measured Measured WHO Young Adult BMD Date       Age      Classification T-score AP Spine L2-L4 01/17/2017 52.0 Osteopenia -1.8 0.988 g/cm2 AP Spine L2-L4 10/27/2015 50.8 Osteopenia -2.1 0.953 g/cm2 AP Spine L2-L4 10/26/2014 49.8 Osteopenia -2.0 0.970 g/cm2 AP Spine L2-L4 04/21/2013 48.2 Osteopenia -1.3 1.058 g/cm2 AP Spine L2-L4 04/17/2012 47.2 Normal -0.5 1.159 g/cm2 DualFemur Neck Right 01/17/2017 52.0 Normal -0.8 0.926 g/cm2 DualFemur Neck Right 10/27/2015 50.8 Normal -0.6 0.952 g/cm2 DualFemur Neck Right 10/26/2014 49.8 Normal -0.4 0.976 g/cm2 DualFemur Neck Right 04/21/2013 48.2 Normal -0.3 1.000 g/cm2 DualFemur Neck Right 04/17/2012 47.2  Normal -0.3 0.999 g/cm2 World Health Organization [facility]) criteria for post-menopausal, Caucasian Women: Normal:       T-score at or above -1 SD Osteopenia:   T-score between -1 and -2.5 SD Osteoporosis: T-score at or below -2.5 SD RECOMMENDATIONS: [place] recommends that FDA-approved medical therapies be considered in postmenopausal women and men age 32 or older with a: 1. Hip or vertebral (clinical or morphometric) fracture. 2. T-score of < -2.5 at the spine or hip. 3. Ten-year fracture probability by FRAX of 3% or greater for hip fracture or 20% or greater for major osteoporotic fracture. All treatment decisions require clinical judgment and consideration of individual patient factors, including patient preferences, co-morbidities, previous drug use, risk factors not captured in the FRAX model (e.g. falls, vitamin D deficiency, increased bone turnover, interval significant decline in bone density) and possible under - or over-estimation of fracture risk by FRAX. All patients should ensure an adequate intake of dietary calcium (1200 mg/d) and vitamin D (800 IU daily) unless contraindicated. FOLLOW-UP: People with diagnosed cases of osteoporosis or at high risk for fracture should have regular bone mineral density tests. For patients eligible for Medicare, routine testing is allowed once every 2 years. The testing frequency can be increased to one year for patients who have rapidly progressing disease, those who are receiving or discontinuing medical therapy to restore bone mass, or have additional risk factors. I have reviewed this report, and agree with the above findings. Mark A. Thornton Papas, M.D. [facility] Radiology Dear Dr. Grayland Ormond, Your patient Cina Klumpp completed a FRAX assessment on 01/17/2017 using the [place] (analysis version: 14.10) manufactured by [employer]. The following summarizes the results of our evaluation. PATIENT BIOGRAPHICAL: Name: Evett, Kassa  Patient ID: [id number] Birth Date: 1966/03/28 Height:    64.0 in. Gender:     Female    [age]       Weight:    208.4 lbs. Ethnicity:  Black                            Exam Date: 01/17/2017 FRAX* RESULTS:  (version: 3.5) 10-year Probability of Fracture1 Major Osteoporotic Fracture2 Hip Fracture 1.8% 0.1% Population: [place] (Black) Risk Factors: Tobacco User (Current Smoker) Based on Femur (Right) Neck BMD 1 -The 10-year probability of fracture may be lower than reported if the patient has received treatment. 2 -Major Osteoporotic Fracture: Clinical Spine, Forearm, Hip or Shoulder *FRAX is a trademark of  the [employer] for Metabolic Bone Disease, a World Pharmacologist (WHO) [facility]. ASSESSMENT: The probability of a major osteoporotic fracture is 1.8% within the next ten years. The probability of a hip fracture is 0.1% within the next ten years. I have reviewed this report and agree with the above findings. Mark A. Thornton Papas, M.D. [facility]iology Electronically Signed   By: Lavonia Dana M.D.   On: 01/17/2017 10:30   Mm Diag Breast Tomo Bilateral  Result Date: 01/17/2017 CLINICAL DATA:  51 year old female presenting for routine annual evaluation status post left breast lumpectomy in 2013. EXAM: 2D DIGITAL DIAGNOSTIC BILATERAL MAMMOGRAM WITH CAD AND ADJUNCT TOMO COMPARISON:  Previous exam(s). ACR Breast Density Category b: There are scattered areas of fibroglandular density. FINDINGS: The left breast lumpectomy site is stable. There is a small asymmetry seen inferior to the lumpectomy site on the MLO view which resolves on spot compression tomosynthesis imaging and is stable from the patient's 2016 mammogram, consistent with overlapping fibroglandular tissue. No suspicious calcifications, masses or areas of distortion are seen in the bilateral breasts. Mammographic images were processed with CAD. IMPRESSION: Stable left breast lumpectomy site. No  mammographic evidence of malignancy in the bilateral breasts. RECOMMENDATION: Diagnostic mammogram is suggested in 1 year. (Code:[id number]-B-01Y) I have discussed the findings and recommendations with the patient. Results were also provided in writing at the conclusion of the visit. If applicable, a reminder letter will be sent to the patient regarding the next appointment. BI-RADS CATEGORY  2: Benign. Electronically Signed   By: Ammie Ferrier M.D.   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   PLAN:    1.  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 Continue 5 years of anastrozole completing in September of 2018. Her most recent mammogram on January 17, 2017 was reported as BI-RADS 2. Repeat in June 2019. Return to clinic in 6 months for further evaluation at which point patient can likely be switched to yearly visits. 2. Osteopenia: Bone mineral density on January 17, 2017 was slightly improved from one year prior to -1.8. Repeat in one year in June 2019.  Continue calcium and vitamin D supplementation.   3. Peripheral neuropathy: Continue current dose of gabapentin. Have also suggested switching to Lyrica, but patient previously declined. Consider neurology referral.  Patient expressed understanding and was in agreement with this plan. She also understands that She can call clinic at any time with any questions, concerns, or complaints.    Lloyd Huger, MD   02/02/2017 6:27 PM

## 2017-01-31 ENCOUNTER — Inpatient Hospital Stay: Payer: 59 | Attending: Oncology | Admitting: Oncology

## 2017-01-31 VITALS — BP 132/91 | HR 90 | Temp 97.4°F | Resp 20 | Wt 204.0 lb

## 2017-01-31 DIAGNOSIS — C50212 Malignant neoplasm of upper-inner quadrant of left female breast: Secondary | ICD-10-CM | POA: Diagnosis not present

## 2017-01-31 DIAGNOSIS — Z79811 Long term (current) use of aromatase inhibitors: Secondary | ICD-10-CM | POA: Diagnosis not present

## 2017-01-31 DIAGNOSIS — Z170 Estrogen receptor positive status [ER+]: Secondary | ICD-10-CM | POA: Diagnosis not present

## 2017-01-31 DIAGNOSIS — R232 Flushing: Secondary | ICD-10-CM | POA: Diagnosis not present

## 2017-01-31 DIAGNOSIS — G4700 Insomnia, unspecified: Secondary | ICD-10-CM

## 2017-01-31 DIAGNOSIS — M129 Arthropathy, unspecified: Secondary | ICD-10-CM | POA: Diagnosis not present

## 2017-01-31 DIAGNOSIS — G629 Polyneuropathy, unspecified: Secondary | ICD-10-CM

## 2017-01-31 DIAGNOSIS — Z79899 Other long term (current) drug therapy: Secondary | ICD-10-CM | POA: Diagnosis not present

## 2017-01-31 DIAGNOSIS — I10 Essential (primary) hypertension: Secondary | ICD-10-CM | POA: Diagnosis not present

## 2017-01-31 DIAGNOSIS — E669 Obesity, unspecified: Secondary | ICD-10-CM | POA: Diagnosis not present

## 2017-01-31 DIAGNOSIS — M8580 Other specified disorders of bone density and structure, unspecified site: Secondary | ICD-10-CM | POA: Diagnosis not present

## 2017-01-31 DIAGNOSIS — J449 Chronic obstructive pulmonary disease, unspecified: Secondary | ICD-10-CM | POA: Diagnosis not present

## 2017-01-31 DIAGNOSIS — M2550 Pain in unspecified joint: Secondary | ICD-10-CM

## 2017-01-31 DIAGNOSIS — E8881 Metabolic syndrome: Secondary | ICD-10-CM | POA: Diagnosis not present

## 2017-01-31 DIAGNOSIS — F17210 Nicotine dependence, cigarettes, uncomplicated: Secondary | ICD-10-CM | POA: Diagnosis not present

## 2017-01-31 DIAGNOSIS — Z7982 Long term (current) use of aspirin: Secondary | ICD-10-CM

## 2017-01-31 NOTE — Progress Notes (Signed)
Patient denies any concerns today, tolerating anastrozole well.

## 2017-02-27 ENCOUNTER — Encounter: Payer: Self-pay | Admitting: Family Medicine

## 2017-02-27 ENCOUNTER — Ambulatory Visit (INDEPENDENT_AMBULATORY_CARE_PROVIDER_SITE_OTHER): Payer: 59 | Admitting: Family Medicine

## 2017-02-27 VITALS — BP 128/72 | HR 86 | Temp 98.9°F | Resp 16 | Ht 64.0 in | Wt 206.1 lb

## 2017-02-27 DIAGNOSIS — I10 Essential (primary) hypertension: Secondary | ICD-10-CM | POA: Diagnosis not present

## 2017-02-27 DIAGNOSIS — G4700 Insomnia, unspecified: Secondary | ICD-10-CM | POA: Diagnosis not present

## 2017-02-27 DIAGNOSIS — M62838 Other muscle spasm: Secondary | ICD-10-CM | POA: Diagnosis not present

## 2017-02-27 DIAGNOSIS — J449 Chronic obstructive pulmonary disease, unspecified: Secondary | ICD-10-CM

## 2017-02-27 DIAGNOSIS — G609 Hereditary and idiopathic neuropathy, unspecified: Secondary | ICD-10-CM | POA: Diagnosis not present

## 2017-02-27 DIAGNOSIS — C50212 Malignant neoplasm of upper-inner quadrant of left female breast: Secondary | ICD-10-CM

## 2017-02-27 DIAGNOSIS — M8580 Other specified disorders of bone density and structure, unspecified site: Secondary | ICD-10-CM | POA: Diagnosis not present

## 2017-02-27 DIAGNOSIS — IMO0002 Reserved for concepts with insufficient information to code with codable children: Secondary | ICD-10-CM

## 2017-02-27 DIAGNOSIS — Z6835 Body mass index (BMI) 35.0-35.9, adult: Secondary | ICD-10-CM

## 2017-02-27 DIAGNOSIS — G63 Polyneuropathy in diseases classified elsewhere: Secondary | ICD-10-CM

## 2017-02-27 MED ORDER — BUPROPION HCL ER (XL) 150 MG PO TB24: 150.0000 mg | ORAL_TABLET | Freq: Every day | ORAL | 2 refills | Status: DC

## 2017-02-27 MED ORDER — HYDROCHLOROTHIAZIDE 12.5 MG PO TABS: 12.5000 mg | ORAL_TABLET | Freq: Every day | ORAL | 2 refills | Status: DC

## 2017-02-27 MED ORDER — ZOLPIDEM TARTRATE 10 MG PO TABS: 5.0000 mg | ORAL_TABLET | Freq: Every day | ORAL | 2 refills | Status: DC

## 2017-02-27 MED ORDER — CYCLOBENZAPRINE HCL 10 MG PO TABS: 5.0000 mg | ORAL_TABLET | Freq: Every day | ORAL | 2 refills | Status: DC

## 2017-02-27 NOTE — Progress Notes (Signed)
Name: Brenda Jones   MRN: [id number]    DOB: February 22, 1966   Date:02/27/2017       Progress Note  Subjective  Chief Complaint  Chief Complaint  Patient presents with  . Follow-up    3 month recheck  . Medication Refill    HPI  HTN: taking bp medication daily,bp is at goal today, she takes HCTZ and has some generalized cramping (taking flexeril), but she does not want to change medication. No chest pain, shortness of breath; has some BLE swelling that has been there since she went through chemo.  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 Seen by Dr. Grayland Ormond, currently only calcium plus D, she had repeat bone density in June 2018 and is stable, continue supplements. Last vitamin D level was at goal   Insomnia: medication helps her fall and stay asleep, but wakes up at 2 am to go to work. She has been getting about 5-6 hours of sleep now, because of her part time job. Discussed FDA and the need to try going down to 5 mg qhs but she states it took her too long to fall asleep with lower dose. She denies side effects, she is aware that she should not take Flexeril and Ambien at the same time. We discussed sleep hygiene and establishing a good routine.  COPD: She was prescribed patches, but they cost too much; has tried Chantix in the past and it didn't help. She has been smoking about a pack a day since she was 51yo.  She is still smoking, she is up to 1 pack daily and denies morning cough.  No SOB with activity,no wheezing .She states she would like to quit because it is expensive, " it would also be good for my health"   Muscles Spasms: got much worse with cancer therapy - in 2013 and takes Cyclobenzaprine to control symptoms. It can be on her trunk or legs, she has tried magnesium and co Q 10 without improvement of symptoms - no longer taking these. No pattern to the spasms, no worsening of frequency or severity. She is aware not to take Flexeril and Ambien at the same time.   Breast cancer  left: seeing Dr. Grayland Ormond, s/p lumpectomy still on oral suppressive medication, mammogram done 01/17/2017.  Still taking Anastrozole  Peripheral Neuropathy: still has daily pain on both legs and feet also also tips of fingers. She is on Gabapentin and has been able to take it during the day; she has not noticed much of an improvement and discussed switching to Lyrica, but she is worried about cost, she does not want to see neurologist. She states pain right now is 6/10 now, usually 6-8/10   Obesity: she drinks 2 sodas a day; family is visiting and she has been eating a lot more because of this.  She gained 2lbs since last visit. Heaviest weight was about 210lbs.  Occasional polyphagia, no polydipsia , but she has polyuria - she thinks related to HCTZ.  She does not exercise.  She eats fried foods, salads, fruits, potato salad.  She works 3a-3p and likes to snack in front of the TV before bed.   Patient Active Problem List   Diagnosis Date Noted  . Osteoarthritis of right knee 08/29/2016  . Osteopenia due to cancer therapy 04/18/2015  . GERD (gastroesophageal reflux disease) 04/18/2015  . Muscle spasm 04/18/2015  . Benign neoplasm of descending colon   . Rectal polyp   . Cervical high risk HPV (human papillomavirus) test positive  03/22/2015  . Benign essential HTN 01/08/2015  . Insomnia, persistent 01/08/2015  . COPD, mild ([place]) 01/08/2015  . HLD (hyperlipidemia) 01/08/2015  . Dysmetabolic syndrome 02/58/5277  . Adult BMI 30+ 01/08/2015  . Seasonal allergic rhinitis 01/08/2015  . Secondary peripheral neuropathy 01/08/2015  . Arthralgia of multiple joints 01/08/2015  . Primary cancer of upper inner quadrant of left female breast ([place]) 02/21/2011    Past Surgical History:  Procedure Laterality Date  . BLADDER SURGERY  2007  . BREAST BIOPSY Left 2013   invasive mammary carcinoma  . BREAST LUMPECTOMY Left 2013   invasive mammary carcinoma with 2 positive lymph nodes. Margins were  clear.   Marland Kitchen BREAST SURGERY    . COLONOSCOPY WITH PROPOFOL N/A 04/04/2015   Procedure: COLONOSCOPY WITH PROPOFOL;  Surgeon: [name], MD;  Location: [facility];  Service: Endoscopy;  Laterality: N/A;  . POLYPECTOMY  04/04/2015   Procedure: POLYPECTOMY;  Surgeon: [name], MD;  Location: [facility];  Service: Endoscopy;;    Family History  Problem Relation Age of Onset  . Heart disease Mother   . Diabetes Father   . Hypertension Father     Social History   Social History  . Marital status: Single    Spouse name: N/A  . Number of children: N/A  . Years of education: N/A   Occupational History  . Not on file.   Social History Main Topics  . Smoking status: Current Every Day Smoker    Packs/day: 2.00    Years: 36.00    Types: Cigarettes, E-cigarettes  . Smokeless tobacco: Never Used  . Alcohol use No  . Drug use: No  . Sexual activity: No   Other Topics Concern  . Not on file   Social History Narrative  . No narrative on file     Current Outpatient Prescriptions:  .  anastrozole (ARIMIDEX) 1 MG tablet, Take 1 tablet (1 mg total) by mouth daily. Please be advised that you MUST keep your appt 01/21/17 or Dr Grayland Ormond will NOT refill this again, Disp: 28 tablet, Rfl: 0 .  aspirin 81 MG tablet, Take 1 tablet by mouth daily., Disp: , Rfl:  .  Coenzyme Q10 100 MG capsule, Take 1 capsule (100 mg total) by mouth daily., Disp: 30 capsule, Rfl: 5 .  cyclobenzaprine (FLEXERIL) 10 MG tablet, Take 0.5-1 tablets (5-10 mg total) by mouth at bedtime., Disp: 30 tablet, Rfl: 2 .  fluticasone (FLONASE) 50 MCG/ACT nasal spray, Place 2 sprays into the nose as needed., Disp: , Rfl:  .  gabapentin (NEURONTIN) 300 MG capsule, Take 1 capsule (300 mg total) by mouth 3 (three) times daily., Disp: 90 capsule, Rfl: 2 .  hydrochlorothiazide (HYDRODIURIL) 12.5 MG tablet, Take 1 tablet (12.5 mg total) by mouth daily., Disp: 30 tablet, Rfl: 2 .  Ipratropium-Albuterol (COMBIVENT  RESPIMAT) 20-100 MCG/ACT AERS respimat, Inhale 1 puff into the lungs as needed., Disp: , Rfl:  .  loratadine (CLARITIN) 10 MG tablet, Take 1 tablet (10 mg total) by mouth as needed for allergies., Disp: 30 tablet, Rfl: 2 .  Magnesium 200 MG TABS, Take 1 tablet (200 mg total) by mouth daily., Disp: 30 each, Rfl: 5 .  nicotine (NICODERM CQ - DOSED IN MG/24 HOURS) 21 mg/24hr patch, PLACE 1 PATCH ONTO THE SKIN DAILY, Disp: 28 patch, Rfl: 2 .  nicotine (NICODERM CQ - DOSED IN MG/24 HR) 7 mg/24hr patch, Place 1 patch (7 mg total) onto the skin daily., Disp: 28 patch,  Rfl: 0 .  nicotine (NICOTINE STEP 2) 14 mg/24hr patch, Place 1 patch (14 mg total) onto the skin daily., Disp: 28 patch, Rfl: 0 .  ranitidine (ZANTAC) 300 MG tablet, Take 1 tablet (300 mg total) by mouth 2 (two) times daily., Disp: 60 tablet, Rfl: 2 .  zolpidem (AMBIEN) 10 MG tablet, Take 0.5-1 tablets (5-10 mg total) by mouth at bedtime., Disp: 30 tablet, Rfl: 2  Allergies  Allergen Reactions  . Ace Inhibitors Cough     ROS  Constitutional: Negative for fever or weight change.  Respiratory: Negative for cough and shortness of breath.   Cardiovascular: Negative for chest pain or palpitations.  Gastrointestinal: Negative for abdominal pain, no bowel changes.  Musculoskeletal: Negative for gait problem or joint swelling.  Skin: Negative for rash.  Neurological: Negative for dizziness or headache.  No other specific complaints in a complete review of systems (except as listed in HPI above).  Objective  Vitals:   02/27/17 0840  BP: 128/72  Pulse: 86  Resp: 16  Temp: 98.9 F (37.2 C)  TempSrc: Oral  SpO2: 95%  Weight: 206 lb 1.6 oz (93.5 kg)  Height: 5\' 4"  (1.626 m)   Body mass index is 35.38 kg/m.  Physical Exam Constitutional: Patient appears well-developed and well-nourished. Obese No distress.  HEENT: head atraumatic, normocephalic, pupils equal and reactive to light, ears, neck supple, throat within normal  limits Cardiovascular: Normal rate, regular rhythm and normal heart sounds.  No murmur heard. Trace  BLE edema. Pulmonary/Chest: Effort normal and breath sounds normal. No respiratory distress. Abdominal: Soft.  There is no tenderness. Psychiatric: Patient has a normal mood and affect. behavior is normal. Judgment and thought content normal.  No results found for this or any previous visit (from the past 2160 hour(s)).  PHQ2/9: Depression screen [facility] 2/9 08/29/2016 05/28/2016 02/24/2016 11/08/2015 08/04/2015  Decreased Interest 0 0 0 0 0  Down, Depressed, Hopeless 0 0 0 0 0  PHQ - 2 Score 0 0 0 0 0   Fall Risk: Fall Risk  08/29/2016 05/28/2016 02/24/2016 11/08/2015 08/04/2015  Falls in the past year? No No No No No    Assessment & Plan  1. Insomnia, persistent - zolpidem (AMBIEN) 10 MG tablet; Take 0.5-1 tablets (5-10 mg total) by mouth at bedtime.  Dispense: 30 tablet; Refill: 2  2. Benign essential HTN - hydrochlorothiazide (HYDRODIURIL) 12.5 MG tablet; Take 1 tablet (12.5 mg total) by mouth daily.  Dispense: 30 tablet; Refill: 2  3. Muscle spasm - cyclobenzaprine (FLEXERIL) 10 MG tablet; Take 0.5-1 tablets (5-10 mg total) by mouth at bedtime.  Dispense: 30 tablet; Refill: 2  4. COPD, mild ([place]) Continue PRN use of Combivent Discussed option of Wellbutrin , and she is willing to try, discussed possible side effects.   5. Secondary peripheral neuropathy Continue Gabapentin Will call insurance company about Lyrica to inquire about cost  6. Osteopenia due to cancer therapy Stable, continue follow up with Dr. Maryjane Hurter  7. Adult BMI 35.0-35.9 kg/sq m PT not motivated at this time to make lifestyle changes.  8. Primary cancer of upper inner quadrant of left female breast ([place]) Stable, continue follow up with Dr. Maryjane Hurter Mammogram stable 12/2016  -Reviewed Health Maintenance: Needs annual physical and labs - is not fasting today.

## 2017-02-27 NOTE — Patient Instructions (Signed)
Steps to Quit Smoking 1-800-quitnow [phone] Smoking tobacco can be harmful to your health and can affect almost every organ in your body. Smoking puts you, and those around you, at risk for developing many serious chronic diseases. Quitting smoking is difficult, but it is one of the best things that you can do for your health. It is never too late to quit. What are the benefits of quitting smoking? When you quit smoking, you lower your risk of developing serious diseases and conditions, such as:  Lung cancer or lung disease, such as COPD.  Heart disease.  Stroke.  Heart attack.  Infertility.  Osteoporosis and bone fractures.  Additionally, symptoms such as coughing, wheezing, and shortness of breath may get better when you quit. You may also find that you get sick less often because your body is stronger at fighting off colds and infections. If you are pregnant, quitting smoking can help to reduce your chances of having a baby of low birth weight. How do I get ready to quit? When you decide to quit smoking, create a plan to make sure that you are successful. Before you quit:  Pick a date to quit. Set a date within the next two weeks to give you time to prepare.  Write down the reasons why you are quitting. Keep this list in places where you will see it often, such as on your bathroom mirror or in your car or wallet.  Identify the people, places, things, and activities that make you want to smoke (triggers) and avoid them. Make sure to take these actions: ? Throw away all cigarettes at home, at work, and in your car. ? Throw away smoking accessories, such as Scientist, research (medical). ? Clean your car and make sure to empty the ashtray. ? Clean your home, including curtains and carpets.  Tell your family, friends, and coworkers that you are quitting. Support from your loved ones can make quitting easier.  Talk with your health care provider about your options for quitting  smoking.  Find out what treatment options are covered by your health insurance.  What strategies can I use to quit smoking? Talk with your healthcare provider about different strategies to quit smoking. Some strategies include:  Quitting smoking altogether instead of gradually lessening how much you smoke over a period of time. Research shows that quitting "cold [place]" is more successful than gradually quitting.  Attending in-person counseling to help you build problem-solving skills. You are more likely to have success in quitting if you attend several counseling sessions. Even short sessions of 10 minutes can be effective.  Finding resources and support systems that can help you to quit smoking and remain smoke-free after you quit. These resources are most helpful when you use them often. They can include: ? Online chats with a Social worker. ? Telephone quitlines. ? Careers information officer. ? Support groups or group counseling. ? Text messaging programs. ? Mobile phone applications.  Taking medicines to help you quit smoking. (If you are pregnant or breastfeeding, talk with your health care provider first.) Some medicines contain nicotine and some do not. Both types of medicines help with cravings, but the medicines that include nicotine help to relieve withdrawal symptoms. Your health care provider may recommend: ? Nicotine patches, gum, or lozenges. ? Nicotine inhalers or sprays. ? Non-nicotine medicine that is taken by mouth.  Talk with your health care provider about combining strategies, such as taking medicines while you are also receiving in-person counseling. Using these two  strategies together makes you more likely to succeed in quitting than if you used either strategy on its own. If you are pregnant or breastfeeding, talk with your health care provider about finding counseling or other support strategies to quit smoking. Do not take medicine to help you quit smoking unless  told to do so by your health care provider. What things can I do to make it easier to quit? Quitting smoking might feel overwhelming at first, but there is a lot that you can do to make it easier. Take these important actions:  Reach out to your family and friends and ask that they support and encourage you during this time. Call telephone quitlines, reach out to support groups, or work with a counselor for support.  Ask people who smoke to avoid smoking around you.  Avoid places that trigger you to smoke, such as bars, parties, or smoke-break areas at work.  Spend time around people who do not smoke.  Lessen stress in your life, because stress can be a smoking trigger for some people. To lessen stress, try: ? Exercising regularly. ? Deep-breathing exercises. ? Yoga. ? Meditating. ? Performing a body scan. This involves closing your eyes, scanning your body from head to toe, and noticing which parts of your body are particularly tense. Purposefully relax the muscles in those areas.  Download or purchase mobile phone or tablet apps (applications) that can help you stick to your quit plan by providing reminders, tips, and encouragement. There are many free apps, such as QuitGuide from the [employer] Office manager for Disease Control and Prevention). You can find other support for quitting smoking (smoking cessation) through smokefree.gov and other websites.  How will I feel when I quit smoking? Within the first 24 hours of quitting smoking, you may start to feel some withdrawal symptoms. These symptoms are usually most noticeable 2-3 days after quitting, but they usually do not last beyond 2-3 weeks. Changes or symptoms that you might experience include:  Mood swings.  Restlessness, anxiety, or irritation.  Difficulty concentrating.  Dizziness.  Strong cravings for sugary foods in addition to nicotine.  Mild weight gain.  Constipation.  Nausea.  Coughing or a sore throat.  Changes in how  your medicines work in your body.  A depressed mood.  Difficulty sleeping (insomnia).  After the first 2-3 weeks of quitting, you may start to notice more positive results, such as:  Improved sense of smell and taste.  Decreased coughing and sore throat.  Slower heart rate.  Lower blood pressure.  Clearer skin.  The ability to breathe more easily.  Fewer sick days.  Quitting smoking is very challenging for most people. Do not get discouraged if you are not successful the first time. Some people need to make many attempts to quit before they achieve long-term success. Do your best to stick to your quit plan, and talk with your health care provider if you have any questions or concerns. This information is not intended to replace advice given to you by your health care provider. Make sure you discuss any questions you have with your health care provider. Document Released: 07/03/2001 Document Revised: 03/06/2016 Document Reviewed: 11/23/2014 Elsevier Interactive Patient Education  2017 [employer].

## 2017-05-30 ENCOUNTER — Ambulatory Visit: Payer: 59 | Admitting: Family Medicine

## 2017-05-30 ENCOUNTER — Encounter: Payer: Self-pay | Admitting: Family Medicine

## 2017-05-30 VITALS — BP 130/80 | HR 80 | Resp 14 | Ht 64.0 in | Wt 200.8 lb

## 2017-05-30 DIAGNOSIS — M62838 Other muscle spasm: Secondary | ICD-10-CM | POA: Diagnosis not present

## 2017-05-30 DIAGNOSIS — J449 Chronic obstructive pulmonary disease, unspecified: Secondary | ICD-10-CM | POA: Diagnosis not present

## 2017-05-30 DIAGNOSIS — I10 Essential (primary) hypertension: Secondary | ICD-10-CM | POA: Diagnosis not present

## 2017-05-30 DIAGNOSIS — E8881 Metabolic syndrome: Secondary | ICD-10-CM | POA: Diagnosis not present

## 2017-05-30 DIAGNOSIS — G63 Polyneuropathy in diseases classified elsewhere: Secondary | ICD-10-CM | POA: Diagnosis not present

## 2017-05-30 DIAGNOSIS — G4700 Insomnia, unspecified: Secondary | ICD-10-CM | POA: Diagnosis not present

## 2017-05-30 DIAGNOSIS — Z6835 Body mass index (BMI) 35.0-35.9, adult: Secondary | ICD-10-CM

## 2017-05-30 DIAGNOSIS — M8580 Other specified disorders of bone density and structure, unspecified site: Secondary | ICD-10-CM

## 2017-05-30 DIAGNOSIS — IMO0002 Reserved for concepts with insufficient information to code with codable children: Secondary | ICD-10-CM

## 2017-05-30 MED ORDER — ZOLPIDEM TARTRATE 10 MG PO TABS: 10.0000 mg | ORAL_TABLET | Freq: Every day | ORAL | 2 refills | Status: DC

## 2017-05-30 MED ORDER — CYCLOBENZAPRINE HCL 10 MG PO TABS: 5.0000 mg | ORAL_TABLET | Freq: Every day | ORAL | 2 refills | Status: DC

## 2017-05-30 MED ORDER — BUPROPION HCL ER (XL) 150 MG PO TB24: 150.0000 mg | ORAL_TABLET | Freq: Every day | ORAL | 2 refills | Status: DC

## 2017-05-30 MED ORDER — HYDROCHLOROTHIAZIDE 12.5 MG PO TABS: 12.5000 mg | ORAL_TABLET | Freq: Every day | ORAL | 2 refills | Status: DC

## 2017-05-30 NOTE — Progress Notes (Signed)
Name: Brenda Jones   MRN: [id number]    DOB: March 29, 1966   Date:05/30/2017       Progress Note  Subjective  Chief Complaint  Chief Complaint  Patient presents with  . Hypertension  . COPD    HPI  HTN: taking bp medication daily, HCTZ and has some generalized cramping (taking flexeril), but she does not want to change medication, she states all the cramping started after chemotherapy not after starting medication. 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 Seen by Dr. Grayland Ormond, currently only calcium plus D, she had repeat bone density in June 2018and is stable, continue supplements. We will check vitamin D level today   Insomnia: medication helps her fall and stay asleep, but wakes up at 2 am to go to work. She has been getting about 5-6 hours of sleep now, because of her part time job. Discussed FDA and the need to try going down to 5 mg qhs but she states it took her too long to fall asleep with lower dose. She denies side effects, she is aware that she should not take Flexeril and Ambien at the same time. We discussed sleep hygiene and establishing a good routine, but she needs to pay bills.   COPD: She was prescribed patches, but they cost too much; has tried Chantix in the past and it didn't help. She has been smoking about a pack a day since she was 51yo.   No SOB with activity,no wheezing .She is now on Wellbutrin for the past 3 months and states it seems to help the cravings a little, she is down to half pack daily   Muscles Spasms: got much worse with cancer therapy - in 2013 and takes Cyclobenzaprine to control symptoms. It can be on her trunk or legs, she has tried magnesium and co Q 10 without improvement of symptoms - no longer taking these. No pattern to the spasms, no worsening of frequency or severity. She is aware not to take Flexeril and Ambien at the same time. Symptoms are stable  Breast cancer left:  seeing Dr. Grayland Ormond, s/p lumpectomy she completed 5 years of Anastrozole 03/2017. , mammogram done 01/17/2017.   Peripheral Neuropathy: still has daily pain on both legs and feet also also tips of fingers. She is on Gabapentin and has been able to take it during the day;she has not noticed much of an improvement and discussed switching to Lyrica, but she is worried about cost, she does not want to see neurologist. She states pain is 7-8/10  Obesity: she states not eating as much since her aunt when back to [place], also drinking sodas occasionally only and has lost 6 lbs in the past 3 months. Occasional polyphagia, no polydipsia , but she has polyuria - she thinks related to HCTZ.  She does not exercise.    Patient Active Problem List   Diagnosis Date Noted  . Osteoarthritis of right knee 08/29/2016  . Osteopenia due to cancer therapy 04/18/2015  . GERD (gastroesophageal reflux disease) 04/18/2015  . Muscle spasm 04/18/2015  . Benign neoplasm of descending colon   . Rectal polyp   . Cervical high risk HPV (human papillomavirus) test positive 03/22/2015  . Benign essential HTN 01/08/2015  . Insomnia, persistent 01/08/2015  . COPD, mild ([place]) 01/08/2015  . HLD (hyperlipidemia) 01/08/2015  . Dysmetabolic syndrome 16/01/3709  . Adult BMI 35.0-35.9 kg/sq m 01/08/2015  . Seasonal allergic rhinitis 01/08/2015  . Secondary  peripheral neuropathy ([place]) 01/08/2015  . Arthralgia of multiple joints 01/08/2015  . Primary cancer of upper inner quadrant of left female breast ([place]) 02/21/2011    Past Surgical History:  Procedure Laterality Date  . BLADDER SURGERY  2007  . BREAST BIOPSY Left 2013   invasive mammary carcinoma  . BREAST LUMPECTOMY Left 2013   invasive mammary carcinoma with 2 positive lymph nodes. Margins were clear.   Marland Kitchen BREAST SURGERY      Family History  Problem Relation Age of Onset  . Heart disease Mother   . Diabetes Father   . Hypertension Father     Social History    Socioeconomic History  . Marital status: Single    Spouse name: Not on file  . Number of children: Not on file  . Years of education: Not on file  . Highest education level: Not on file  Social Needs  . Financial resource strain: Not on file  . Food insecurity - worry: Not on file  . Food insecurity - inability: Not on file  . Transportation needs - medical: Not on file  . Transportation needs - non-medical: Not on file  Occupational History  . Not on file  Tobacco Use  . Smoking status: Current Every Day Smoker    Packs/day: 2.00    Years: 36.00    Pack years: 72.00    Types: Cigarettes, E-cigarettes  . Smokeless tobacco: Never Used  Substance and Sexual Activity  . Alcohol use: No    Alcohol/week: 0.0 oz  . Drug use: No  . Sexual activity: No  Other Topics Concern  . Not on file  Social History Narrative  . Not on file     Current Outpatient Medications:  .  aspirin 81 MG tablet, Take 1 tablet by mouth daily., Disp: , Rfl:  .  buPROPion (WELLBUTRIN XL) 150 MG 24 hr tablet, Take 1 tablet (150 mg total) by mouth daily., Disp: 30 tablet, Rfl: 2 .  cyclobenzaprine (FLEXERIL) 10 MG tablet, Take 0.5-1 tablets (5-10 mg total) by mouth at bedtime., Disp: 30 tablet, Rfl: 2 .  fluticasone (FLONASE) 50 MCG/ACT nasal spray, Place 2 sprays into the nose as needed., Disp: , Rfl:  .  gabapentin (NEURONTIN) 300 MG capsule, Take 1 capsule (300 mg total) by mouth 3 (three) times daily., Disp: 90 capsule, Rfl: 2 .  hydrochlorothiazide (HYDRODIURIL) 12.5 MG tablet, Take 1 tablet (12.5 mg total) by mouth daily., Disp: 30 tablet, Rfl: 2 .  Ipratropium-Albuterol (COMBIVENT RESPIMAT) 20-100 MCG/ACT AERS respimat, Inhale 1 puff into the lungs as needed., Disp: , Rfl:  .  loratadine (CLARITIN) 10 MG tablet, Take 1 tablet (10 mg total) by mouth as needed for allergies., Disp: 30 tablet, Rfl: 2 .  ranitidine (ZANTAC) 300 MG tablet, Take 1 tablet (300 mg total) by mouth 2 (two) times daily., Disp:  60 tablet, Rfl: 2 .  zolpidem (AMBIEN) 10 MG tablet, Take 0.5-1 tablets (5-10 mg total) by mouth at bedtime., Disp: 30 tablet, Rfl: 2  Allergies  Allergen Reactions  . Ace Inhibitors Cough     ROS  Constitutional: Negative for fever or weight change.  Respiratory: Negative for cough and shortness of breath.   Cardiovascular: Negative for chest pain or palpitations.  Gastrointestinal: Negative for abdominal pain, no bowel changes.  Musculoskeletal: Negative for gait problem or joint swelling.  Skin: Negative for rash.  Neurological: Negative for dizziness or headache.  No other specific complaints in a complete review of systems (except  as listed in HPI above).  Objective  Vitals:   05/30/17 0744  BP: 140/84  Pulse: 80  Resp: 14  SpO2: 98%  Weight: 200 lb 12.8 oz (91.1 kg)  Height: 5\' 4"  (1.626 m)    Body mass index is 34.47 kg/m.  Physical Exam  Constitutional: Patient appears well-developed and well-nourished. Obese  No distress.  HEENT: head atraumatic, normocephalic, pupils equal and reactive to light,  neck supple, throat within normal limits Cardiovascular: Normal rate, regular rhythm and normal heart sounds.  No murmur heard. No BLE edema. Pulmonary/Chest: Effort normal and breath sounds normal. No respiratory distress. Abdominal: Soft.  There is no tenderness. Psychiatric: Patient has a normal mood and affect. behavior is normal. Judgment and thought content normal.  PHQ2/9: Depression screen [facility] 2/9 08/29/2016 05/28/2016 02/24/2016 11/08/2015 08/04/2015  Decreased Interest 0 0 0 0 0  Down, Depressed, Hopeless 0 0 0 0 0  PHQ - 2 Score 0 0 0 0 0     Fall Risk: Fall Risk  05/30/2017 08/29/2016 05/28/2016 02/24/2016 11/08/2015  Falls in the past year? No No No No No     Assessment & Plan  1. Benign essential HTN  Slightly elevated when she came in, but usually at goal, continue current medications at this time - hydrochlorothiazide (HYDRODIURIL) 12.5 MG tablet;  Take 1 tablet (12.5 mg total) daily by mouth.  Dispense: 30 tablet; Refill: 2 - Lipid panel - CBC with Differential/Platelet - COMPLETE METABOLIC PANEL WITH GFR  2. COPD, mild (H[facility])  - buPROPion (WELLBUTRIN XL) 150 MG 24 hr tablet; Take 1 tablet (150 mg total) daily by mouth.  Dispense: 30 tablet; Refill: 2  3. Muscle spasm  - cyclobenzaprine (FLEXERIL) 10 MG tablet; Take 0.5-1 tablets (5-10 mg total) at bedtime by mouth.  Dispense: 30 tablet; Refill: 2  4. Insomnia, persistent  - zolpidem (AMBIEN) 10 MG tablet; Take 1 tablet (10 mg total) at bedtime by mouth.  Dispense: 30 tablet; Refill: 2  5. Secondary peripheral neuropathy (HCC)  - Vitamin B12  6. Osteopenia due to cancer therapy  - VITAMIN D 25 Hydroxy (Vit-D Deficiency, Fractures)  7. Dysmetabolic syndrome  - Hemoglobin A1c - Insulin, random  8. Adult BMI 35.0-35.9 kg/sq m  Lost 6 lbs since last visit, she states her aunt is no longer at her house cooking all the time

## 2017-05-31 LAB — CBC WITH DIFFERENTIAL/PLATELET
BASOS ABS: 41 {cells}/uL (ref 0–200)
Basophils Relative: 0.5 %
EOS PCT: 1.2 %
Eosinophils Absolute: 98 cells/uL (ref 15–500)
HCT: 40.9 % (ref 35.0–45.0)
HEMOGLOBIN: 13.5 g/dL (ref 11.7–15.5)
Lymphs Abs: 2886 cells/uL (ref 850–3900)
MCH: 27.1 pg (ref 27.0–33.0)
MCHC: 33 g/dL (ref 32.0–36.0)
MCV: 82.1 fL (ref 80.0–100.0)
MPV: 10.5 fL (ref 7.5–12.5)
Monocytes Relative: 6.1 %
NEUTROS ABS: 4674 {cells}/uL (ref 1500–7800)
NEUTROS PCT: 57 %
Platelets: 344 10*3/uL (ref 140–400)
RBC: 4.98 10*6/uL (ref 3.80–5.10)
RDW: 13.7 % (ref 11.0–15.0)
Total Lymphocyte: 35.2 %
WBC: 8.2 10*3/uL (ref 3.8–10.8)
WBCMIX: 500 {cells}/uL (ref 200–950)

## 2017-05-31 LAB — COMPLETE METABOLIC PANEL WITH GFR
AG RATIO: 1.4 (calc) (ref 1.0–2.5)
ALBUMIN MSPROF: 4.1 g/dL (ref 3.6–5.1)
ALT: 10 U/L (ref 6–29)
AST: 14 U/L (ref 10–35)
Alkaline phosphatase (APISO): 100 U/L (ref 33–130)
BUN: 11 mg/dL (ref 7–25)
CALCIUM: 9.2 mg/dL (ref 8.6–10.4)
CO2: 29 mmol/L (ref 20–32)
CREATININE: 0.74 mg/dL (ref 0.50–1.05)
Chloride: 102 mmol/L (ref 98–110)
GFR, EST NON AFRICAN AMERICAN: 93 mL/min/{1.73_m2} (ref >=60)
GFR, Est African American: 108 mL/min/{1.73_m2} (ref >=60)
GLOBULIN: 2.9 g/dL (ref 1.9–3.7)
Glucose, Bld: 94 mg/dL (ref 65–99)
POTASSIUM: 4 mmol/L (ref 3.5–5.3)
SODIUM: 138 mmol/L (ref 135–146)
Total Bilirubin: 0.5 mg/dL (ref 0.2–1.2)
Total Protein: 7 g/dL (ref 6.1–8.1)

## 2017-05-31 LAB — LIPID PANEL
CHOLESTEROL: 142 mg/dL (ref <200)
HDL: 47 mg/dL — AB (ref >50)
LDL Cholesterol (Calc): 79 mg/dL (calc)
Non-HDL Cholesterol (Calc): 95 mg/dL (calc) (ref <130)
TRIGLYCERIDES: 79 mg/dL (ref <150)
Total CHOL/HDL Ratio: 3 (calc) (ref <5.0)

## 2017-05-31 LAB — HEMOGLOBIN A1C
EAG (MMOL/L): 6.6 (calc)
Hgb A1c MFr Bld: 5.8 % of total Hgb — ABNORMAL HIGH (ref <5.7)
MEAN PLASMA GLUCOSE: 120 (calc)

## 2017-05-31 LAB — VITAMIN B12: Vitamin B-12: 372 pg/mL (ref 200–1100)

## 2017-05-31 LAB — VITAMIN D 25 HYDROXY (VIT D DEFICIENCY, FRACTURES): Vit D, 25-Hydroxy: 12 ng/mL — ABNORMAL LOW (ref 30–100)

## 2017-05-31 LAB — INSULIN, RANDOM: Insulin: 4.5 u[IU]/mL (ref 2.0–19.6)

## 2017-06-03 ENCOUNTER — Other Ambulatory Visit: Payer: Self-pay | Admitting: Family Medicine

## 2017-06-03 MED ORDER — VITAMIN D (ERGOCALCIFEROL) 1.25 MG (50000 UNIT) PO CAPS: 50000.0000 [IU] | ORAL_CAPSULE | ORAL | 0 refills | Status: DC

## 2017-06-27 ENCOUNTER — Encounter: Payer: Self-pay | Admitting: Family Medicine

## 2017-06-27 ENCOUNTER — Ambulatory Visit (INDEPENDENT_AMBULATORY_CARE_PROVIDER_SITE_OTHER): Payer: 59 | Admitting: Family Medicine

## 2017-06-27 VITALS — BP 132/60 | HR 97 | Temp 98.2°F | Resp 16 | Ht 63.39 in | Wt 196.0 lb

## 2017-06-27 DIAGNOSIS — J441 Chronic obstructive pulmonary disease with (acute) exacerbation: Secondary | ICD-10-CM | POA: Diagnosis not present

## 2017-06-27 DIAGNOSIS — Z01419 Encounter for gynecological examination (general) (routine) without abnormal findings: Secondary | ICD-10-CM | POA: Diagnosis not present

## 2017-06-27 DIAGNOSIS — J069 Acute upper respiratory infection, unspecified: Secondary | ICD-10-CM | POA: Diagnosis not present

## 2017-06-27 DIAGNOSIS — Z124 Encounter for screening for malignant neoplasm of cervix: Secondary | ICD-10-CM

## 2017-06-27 DIAGNOSIS — Z23 Encounter for immunization: Secondary | ICD-10-CM

## 2017-06-27 DIAGNOSIS — R87810 Cervical high risk human papillomavirus (HPV) DNA test positive: Secondary | ICD-10-CM | POA: Diagnosis not present

## 2017-06-27 DIAGNOSIS — Z1239 Encounter for other screening for malignant neoplasm of breast: Secondary | ICD-10-CM

## 2017-06-27 MED ORDER — PREDNISONE 20 MG PO TABS: 20.0000 mg | ORAL_TABLET | Freq: Every day | ORAL | 0 refills | Status: DC

## 2017-06-27 MED ORDER — FLUTICASONE FUROATE-VILANTEROL 100-25 MCG/INH IN AEPB: 1.0000 | INHALATION_SPRAY | Freq: Every day | RESPIRATORY_TRACT | 0 refills | Status: DC

## 2017-06-27 MED ORDER — BENZONATATE 100 MG PO CAPS: 100.0000 mg | ORAL_CAPSULE | Freq: Three times a day (TID) | ORAL | 0 refills | Status: DC | PRN

## 2017-06-27 NOTE — Progress Notes (Signed)
Name: Brenda Jones   MRN: [id number]    DOB: 1966-01-30   Date:06/27/2017       Progress Note  Subjective  Chief Complaint  Chief Complaint  Patient presents with  . Annual Exam    HPI   Patient presents for annual CPE and complaints of cold symptoms  COPD exacerbation : she states she has been sick for the past few weeks, episodes of clear rhinorrhea, nasal congestion, productive  Cough, she has some episodes of SOB, she has also noticed wheezing. The cough is causing a lot of stress incontinence. She denies fever, change in appetite , nausea or vomiting. She smokes  Diet: not enough calcium in her diet Exercise: only at work, has physical job  USPSTF grade A and B recommendations  Depression:  Depression screen [facility] 2/9 06/27/2017 08/29/2016 05/28/2016 02/24/2016 11/08/2015  Decreased Interest 2 0 0 0 0  Down, Depressed, Hopeless 0 0 0 0 0  PHQ - 2 Score 2 0 0 0 0  Altered sleeping 3 - - - -  Tired, decreased energy 2 - - - -  Change in appetite 0 - - - -  Feeling bad or failure about yourself  0 - - - -  Trouble concentrating 0 - - - -  Moving slowly or fidgety/restless 0 - - - -  Suicidal thoughts 0 - - - -  PHQ-9 Score 7 - - - -  Difficult doing work/chores Not difficult at all - - - -   Hypertension: BP Readings from Last 3 Encounters:  06/27/17 132/60  05/30/17 130/80  02/27/17 128/72   Obesity: Wt Readings from Last 3 Encounters:  06/27/17 196 lb (88.9 kg)  05/30/17 200 lb 12.8 oz (91.1 kg)  02/27/17 206 lb 1.6 oz (93.5 kg)   BMI Readings from Last 3 Encounters:  06/27/17 34.30 kg/m  05/30/17 34.47 kg/m  02/27/17 35.38 kg/m    Alcohol: never Tobacco use: not ready to quit yet HIV, hep B, hep C: up to date, not sexually active in the past 5 years STD testing and prevention (chl/gon/syphilis): up to date Intimate partner violence:negative screen  Sexual History/Pain during Intercourse: not sexually active for the past 5 years.  Menstrual  History/LMP/Abnormal Bleeding: post-menopausal, discussed importance of follow up if she has post-menopausal bleeding.  Incontinence Symptoms: she has symptoms of stress incontinence, she also has some urge incontinence   Advanced Care Planning: A voluntary discussion about advance care planning including the explanation and discussion of advance directives.  Discussed health care proxy and Living will, and the patient was able to identify a health care proxy as her only son.  Patient does not have a living will at present time.   Breast cancer:  HM Mammogram  Date Value Ref Range Status  08/16/2013 Normal  Final    BRCA gene screening: had breast cancer seen by Dr. Grayland Ormond  Cervical cancer screening: today, normal in 2017 but high risk HPV in 2016    Lipids:  Lab Results  Component Value Date   CHOL 142 05/30/2017   CHOL 133 03/19/2016   CHOL 149 09/09/2012   Lab Results  Component Value Date   HDL 47 (L) 05/30/2017   HDL 49 03/19/2016   HDL 40 09/09/2012   Lab Results  Component Value Date   LDLCALC 69 03/19/2016   LDLCALC 94 09/09/2012   Lab Results  Component Value Date   TRIG 79 05/30/2017   TRIG 73 03/19/2016   TRIG 77 09/09/2012  Lab Results  Component Value Date   CHOLHDL 3.0 05/30/2017   CHOLHDL 2.7 03/19/2016   No results found for: LDLDIRECT  Glucose:  Glucose  Date Value Ref Range Status  01/25/2012 148 (H) 65 - 99 mg/dL Final  01/17/2012 124 (H) 65 - 99 mg/dL Final  01/11/2012 116 (H) 65 - 99 mg/dL Final   Glucose, Bld  Date Value Ref Range Status  05/30/2017 94 65 - 99 mg/dL Final    Comment:    .            Fasting reference interval .   03/19/2016 94 65 - 99 mg/dL Final     Colorectal cancer: 51 years old. Lung cancer:   Low Dose CT Chest recommended if Age 53-80 years, 30 pack-year currently smoking OR have quit w/in 15years. Patient does not qualify.   Aspirin: yes ECG: due for repeat but she would like to do it at another  time Bone density: FRAX RESULTS:  (version: 3.5) - done 12/2016 showed osteopenia.  10-year Probability of Fracture 1.8% Major Osteoporotic Fracture2 Hip Fracture 0.1% Needs to increase vitamin D level and also discussed sources of calcium supplementation    Patient Active Problem List   Diagnosis Date Noted  . Osteoarthritis of right knee 08/29/2016  . Osteopenia due to cancer therapy 04/18/2015  . GERD (gastroesophageal reflux disease) 04/18/2015  . Muscle spasm 04/18/2015  . Benign neoplasm of descending colon   . Rectal polyp   . Cervical high risk HPV (human papillomavirus) test positive 03/22/2015  . Benign essential HTN 01/08/2015  . Insomnia, persistent 01/08/2015  . COPD, mild ([place]) 01/08/2015  . HLD (hyperlipidemia) 01/08/2015  . Dysmetabolic syndrome 37/16/9678  . Adult BMI 35.0-35.9 kg/sq m 01/08/2015  . Seasonal allergic rhinitis 01/08/2015  . Secondary peripheral neuropathy ([place]) 01/08/2015  . Arthralgia of multiple joints 01/08/2015  . Primary cancer of upper inner quadrant of left female breast ([place]) 02/21/2011    Past Surgical History:  Procedure Laterality Date  . BLADDER SURGERY  2007  . BREAST BIOPSY Left 2013   invasive mammary carcinoma  . BREAST LUMPECTOMY Left 2013   invasive mammary carcinoma with 2 positive lymph nodes. Margins were clear.   Marland Kitchen BREAST SURGERY    . COLONOSCOPY WITH PROPOFOL N/A 04/04/2015   Procedure: COLONOSCOPY WITH PROPOFOL;  Surgeon: [name], MD;  Location: [facility];  Service: Endoscopy;  Laterality: N/A;  . POLYPECTOMY  04/04/2015   Procedure: POLYPECTOMY;  Surgeon: [name], MD;  Location: [facility];  Service: Endoscopy;;    Family History  Problem Relation Age of Onset  . Heart disease Mother   . Diabetes Father   . Hypertension Father     Social History   Socioeconomic History  . Marital status: Single    Spouse name: Not on file  . Number of children: Not on file  . Years of education:  Not on file  . Highest education level: 12th grade  Social Needs  . Financial resource strain: Not very hard  . Food insecurity - worry: Never true  . Food insecurity - inability: Never true  . Transportation needs - medical: No  . Transportation needs - non-medical: No  Occupational History  . Occupation: Radio producer.   Tobacco Use  . Smoking status: Current Every Day Smoker    Packs/day: 0.75    Years: 36.00    Pack years: 27.00    Types: Cigarettes    Start date: 06/27/1981  .  Smokeless tobacco: Never Used  Substance and Sexual Activity  . Alcohol use: No    Alcohol/week: 0.0 oz  . Drug use: No  . Sexual activity: No  Other Topics Concern  . Not on file  Social History Narrative   She has one son, still lives at home, he helps her out financially also.      Current Outpatient Medications:  .  aspirin 81 MG tablet, Take 1 tablet by mouth daily., Disp: , Rfl:  .  buPROPion (WELLBUTRIN XL) 150 MG 24 hr tablet, Take 1 tablet (150 mg total) daily by mouth., Disp: 30 tablet, Rfl: 2 .  cyclobenzaprine (FLEXERIL) 10 MG tablet, Take 0.5-1 tablets (5-10 mg total) at bedtime by mouth., Disp: 30 tablet, Rfl: 2 .  fluticasone (FLONASE) 50 MCG/ACT nasal spray, Place 2 sprays into the nose as needed., Disp: , Rfl:  .  gabapentin (NEURONTIN) 300 MG capsule, Take 1 capsule (300 mg total) by mouth 3 (three) times daily., Disp: 90 capsule, Rfl: 2 .  hydrochlorothiazide (HYDRODIURIL) 12.5 MG tablet, Take 1 tablet (12.5 mg total) daily by mouth., Disp: 30 tablet, Rfl: 2 .  Ipratropium-Albuterol (COMBIVENT RESPIMAT) 20-100 MCG/ACT AERS respimat, Inhale 1 puff into the lungs as needed., Disp: , Rfl:  .  loratadine (CLARITIN) 10 MG tablet, Take 1 tablet (10 mg total) by mouth as needed for allergies., Disp: 30 tablet, Rfl: 2 .  ranitidine (ZANTAC) 300 MG tablet, Take 1 tablet (300 mg total) by mouth 2 (two) times daily., Disp: 60 tablet, Rfl: 2 .  Vitamin D, Ergocalciferol, (DRISDOL) 50000  units CAPS capsule, Take 1 capsule (50,000 Units total) every 7 (seven) days by mouth., Disp: 12 capsule, Rfl: 0 .  zolpidem (AMBIEN) 10 MG tablet, Take 1 tablet (10 mg total) at bedtime by mouth., Disp: 30 tablet, Rfl: 2  Allergies  Allergen Reactions  . Ace Inhibitors Cough     ROS   Constitutional: Negative for fever or weight change.  Respiratory: Negative for cough and shortness of breath.   Cardiovascular: Negative for chest pain or palpitations.  Gastrointestinal: Negative for abdominal pain, no bowel changes.  Musculoskeletal: Negative for gait problem or joint swelling.  Skin: Negative for rash.  Neurological: Negative for dizziness or headache.  No other specific complaints in a complete review of systems (except as listed in HPI above).   Objective  Vitals:   06/27/17 0923  BP: 132/60  Pulse: 97  Resp: 16  Temp: 98.2 F (36.8 C)  TempSrc: Oral  SpO2: 99%  Weight: 196 lb (88.9 kg)  Height: 5' 3.39" (1.61 m)    Body mass index is 34.3 kg/m.  Physical Exam  Constitutional: Patient appears well-developed and well-nourished. No distress.  HENT: Head: Normocephalic and atraumatic. Ears: B TMs ok, no erythema or effusion; Nose: boggy turbinates, clear rhinorrhea Mouth/Throat: Oropharynx is clear and moist. No oropharyngeal exudate.  Eyes: Conjunctivae and EOM are normal. Pupils are equal, round, and reactive to light. No scleral icterus.  Neck: Normal range of motion. Neck supple. No JVD present. No thyromegaly present.  Cardiovascular: Normal rate, regular rhythm and normal heart sounds.  No murmur heard. No BLE edema. Pulmonary/Chest: Effort normal breath sounds rhonchi bilaterally.  No respiratory distress. Abdominal: Soft. Bowel sounds are normal, no distension. There is no tenderness. no masses Breast: scar on left upper breast from previous lumpectomy , no nipple discharge or rashes FEMALE GENITALIA:  External genitalia normal External urethra  normal Vaginal vault normal without discharge or lesions  Cervix normal without discharge or lesions Bimanual exam normal without masses RECTAL: not done Musculoskeletal: Normal range of motion, no joint effusions. No gross deformities Neurological: he is alert and oriented to person, place, and time. No cranial nerve deficit. Coordination, balance, strength, speech and gait are normal.  Skin: Skin is warm and dry. No rash noted. No erythema.  Psychiatric: Patient has a normal mood and affect. behavior is normal. Judgment and thought content normal.   Recent Results (from the past 2160 hour(s))  Lipid panel     Status: Abnormal   Collection Time: 05/30/17  8:18 AM  Result Value Ref Range   Cholesterol 142 <200 mg/dL   HDL 47 (L) >50 mg/dL   Triglycerides 79 <150 mg/dL   LDL Cholesterol (Calc) 79 mg/dL (calc)    Comment: Reference range: <100 . Desirable range <100 mg/dL for primary prevention;   <70 mg/dL for patients with CHD or diabetic patients  with > or = 2 CHD risk factors. Marland Kitchen LDL-C is now calculated using the Martin-Hopkins  calculation, which is a validated novel method providing  better accuracy than the Friedewald equation in the  estimation of LDL-C.  Cresenciano Genre et al. Annamaria Helling. 7412;878(67): 2061-2068  (http://education.[facility].com/faq/FAQ164)    Total CHOL/HDL Ratio 3.0 <5.0 (calc)   Non-HDL Cholesterol (Calc) 95 <130 mg/dL (calc)    Comment: For patients with diabetes plus 1 major ASCVD risk  factor, treating to a non-HDL-C goal of <100 mg/dL  (LDL-C of <70 mg/dL) is considered a therapeutic  option.   CBC with Differential/Platelet     Status: None   Collection Time: 05/30/17  8:18 AM  Result Value Ref Range   WBC 8.2 3.8 - 10.8 Thousand/uL   RBC 4.98 3.80 - 5.10 Million/uL   Hemoglobin 13.5 11.7 - 15.5 g/dL   HCT 40.9 35.0 - 45.0 %   MCV 82.1 80.0 - 100.0 fL   MCH 27.1 27.0 - 33.0 pg   MCHC 33.0 32.0 - 36.0 g/dL   RDW 13.7 11.0 - 15.0 %   Platelets  344 140 - 400 Thousand/uL   MPV 10.5 7.5 - 12.5 fL   Neutro Abs 4,674 1,500 - 7,800 cells/uL   Lymphs Abs 2,886 850 - 3,900 cells/uL   WBC mixed population 500 200 - 950 cells/uL   Eosinophils Absolute 98 15 - 500 cells/uL   Basophils Absolute 41 0 - 200 cells/uL   Neutrophils Relative % 57 %   Total Lymphocyte 35.2 %   Monocytes Relative 6.1 %   Eosinophils Relative 1.2 %   Basophils Relative 0.5 %  COMPLETE METABOLIC PANEL WITH GFR     Status: None   Collection Time: 05/30/17  8:18 AM  Result Value Ref Range   Glucose, Bld 94 65 - 99 mg/dL    Comment: .            Fasting reference interval .    BUN 11 7 - 25 mg/dL   Creat 0.74 0.50 - 1.05 mg/dL    Comment: For patients >34 years of age, the reference limit for Creatinine is approximately 13% higher for people identified as African-American. .    GFR, Est Non African American 93 > OR = 60 mL/min/1.63m   GFR, Est African American 108 > OR = 60 mL/min/1.775m  BUN/Creatinine Ratio NOT APPLICABLE 6 - 22 (calc)   Sodium 138 135 - 146 mmol/L   Potassium 4.0 3.5 - 5.3 mmol/L   Chloride 102 98 - 110 mmol/L  CO2 29 20 - 32 mmol/L   Calcium 9.2 8.6 - 10.4 mg/dL   Total Protein 7.0 6.1 - 8.1 g/dL   Albumin 4.1 3.6 - 5.1 g/dL   Globulin 2.9 1.9 - 3.7 g/dL (calc)   AG Ratio 1.4 1.0 - 2.5 (calc)   Total Bilirubin 0.5 0.2 - 1.2 mg/dL   Alkaline phosphatase (APISO) 100 33 - 130 U/L   AST 14 10 - 35 U/L   ALT 10 6 - 29 U/L  Vitamin B12     Status: None   Collection Time: 05/30/17  8:18 AM  Result Value Ref Range   Vitamin B-12 372 200 - 1,100 pg/mL    Comment: . Please Note: Although the reference range for vitamin B12 is [phone] pg/mL, it has been reported that between 5 and 10% of patients with values between 200 and 400 pg/mL may experience neuropsychiatric and hematologic abnormalities due to occult B12 deficiency; less than 1% of patients with values above 400 pg/mL will have symptoms. Marland Kitchen   VITAMIN D 25 Hydroxy (Vit-D  Deficiency, Fractures)     Status: Abnormal   Collection Time: 05/30/17  8:18 AM  Result Value Ref Range   Vit D, 25-Hydroxy 12 (L) 30 - 100 ng/mL    Comment: Vitamin D Status         25-OH Vitamin D: . Deficiency:                    <20 ng/mL Insufficiency:             20 - 29 ng/mL Optimal:                 > or = 30 ng/mL . For 25-OH Vitamin D testing on patients on  D2-supplementation and patients for whom quantitation  of D2 and D3 fractions is required, the [facility](TM) 25-OH VIT D, (D2,D3), LC/MS/MS is recommended: order  code [phone])[phone] (patients >34yr). . For more information on this test, go to: http://education.[facility].com/faq/FAQ163 (This link is being provided for  informational/educational purposes only.)   Hemoglobin A1c     Status: Abnormal   Collection Time: 05/30/17  8:18 AM  Result Value Ref Range   Hgb A1c MFr Bld 5.8 (H) <5.7 % of total Hgb    Comment: For someone without known diabetes, a hemoglobin  A1c value between 5.7% and 6.4% is consistent with prediabetes and should be confirmed with a  follow-up test. . For someone with known diabetes, a value <7% indicates that their diabetes is well controlled. A1c targets should be individualized based on duration of diabetes, age, comorbid conditions, and other considerations. . This assay result is consistent with an increased risk of diabetes. . Currently, no consensus exists regarding use of hemoglobin A1c for diagnosis of diabetes for children. .    Mean Plasma Glucose 120 (calc)   eAG (mmol/L) 6.6 (calc)  Insulin, random     Status: None   Collection Time: 05/30/17  8:18 AM  Result Value Ref Range   Insulin 4.5 2.0 - 19.6 uIU/mL    Comment: This insulin assay shows strong cross-reactivity for some insulin analogs (lispro, aspart, and glargine) and much lower cross-reactivity with others (detemir, glulisine).       PHQ2/9: [facility] 06/27/2017 08/29/2016 05/28/2016  02/24/2016 11/08/2015  Decreased Interest 2 0 0 0 0  Down, Depressed, Hopeless 0 0 0 0 0  PHQ - 2 Score 2 0 0 0 0  Altered sleeping 3 - - - -  Tired, decreased energy 2 - - - -  Change in appetite 0 - - - -  Feeling bad or failure about yourself  0 - - - -  Trouble concentrating 0 - - - -  Moving slowly or fidgety/restless 0 - - - -  Suicidal thoughts 0 - - - -  PHQ-9 Score 7 - - - -  Difficult doing work/chores Not difficult at all - - - -   She states she is not depressed   Fall Risk: Fall Risk  06/27/2017 05/30/2017 08/29/2016 05/28/2016 02/24/2016  Falls in the past year? No No No No No     Assessment & Plan  1. Well woman exam  Discussed importance of 150 minutes of physical activity weekly, eat two servings of fish weekly, eat one serving of tree nuts ( cashews, pistachios, pecans, almonds.Marland Kitchen) every other day, eat 6 servings of fruit/vegetables daily and drink plenty of water and avoid sweet beverages.   2. Screening for cervical cancer  Pap smear today   3. Breast cancer screening  Up to date, history of breast cancer  4. Cervical high risk HPV (human papillomavirus) test positive  - Pap IG and HPV (high risk) DNA detection  5. Needs flu shot  refused  6. COPD with exacerbation ([place])  - predniSONE (DELTASONE) 20 MG tablet; Take 1 tablet (20 mg total) by mouth daily with breakfast.  Dispense: 10 tablet; Refill: 0 - benzonatate (TESSALON) 100 MG capsule; Take 1-2 capsules (100-200 mg total) by mouth 3 (three) times daily as needed.  Dispense: 40 capsule; Refill: 0 - Breo also sent to pharmacy   7. URI, acute  Continue Mucinex prn

## 2017-07-02 LAB — PAP IG AND HPV HIGH-RISK: HPV DNA High Risk: NOT DETECTED

## 2017-08-01 ENCOUNTER — Inpatient Hospital Stay: Payer: 59 | Admitting: Oncology

## 2017-08-12 NOTE — Progress Notes (Deleted)
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  INTERVAL HISTORY: Patient returns to clinic today for routine 6 month evaluation.  She continues to have peripheral neuropathy that is unchanged.  She is tolerating anastrozole well without significant side effects. She does admit to occasional hot flashes that are tolerable. She has no other neurologic complaints. She has had no recent fevers or illnesses. She has a good appetite and denies weight loss. She has no chest pain or shortness of breath. She denies any nausea, vomiting, constipation, or diarrhea. She has no urinary complaints. Patient offers no further specific complaints today.  REVIEW OF SYSTEMS:   Review of Systems  Constitutional: Negative.  Negative for fever, malaise/fatigue and weight loss.  Respiratory: Negative.  Negative for cough and shortness of breath.   Cardiovascular: Negative.  Negative for chest pain and leg swelling.  Gastrointestinal: Negative.  Negative for abdominal pain.  Genitourinary: Negative.   Musculoskeletal: Positive for joint pain.  Neurological: Positive for sensory change. Negative for weakness.  Psychiatric/Behavioral: Negative.  The patient is not nervous/anxious.     As per HPI. Otherwise, a complete review of systems is negative.  PAST MEDICAL HISTORY: Past Medical History:  Diagnosis Date  . Allergy   . Arthritis   . Breast CA ([place])   . Breast cancer ([place]) 2013   left breast lumpectomy with chemo and rad tx  . COPD (chronic obstructive pulmonary disease) ([place])   . Headache    occasional - thinks it is from BP meds  . Hypertension   . Insomnia   . Metabolic syndrome   . Obesity   . Peripheral  neuropathy     PAST SURGICAL HISTORY: Past Surgical History:  Procedure Laterality Date  . BLADDER SURGERY  2007  . BREAST BIOPSY Left 2013   invasive mammary carcinoma  . BREAST LUMPECTOMY Left 2013   invasive mammary carcinoma with 2 positive lymph nodes. Margins were clear.   Marland Kitchen BREAST SURGERY    . COLONOSCOPY WITH PROPOFOL N/A 04/04/2015   Procedure: COLONOSCOPY WITH PROPOFOL;  Surgeon: [name], MD;  Location: [place];  Service: Endoscopy;  Laterality: N/A;  . POLYPECTOMY  04/04/2015   Procedure: POLYPECTOMY;  Surgeon: [name], MD;  Location: [place];  Service: Endoscopy;;    FAMILY HISTORY Family History  Problem Relation Age of Onset  . Heart disease Mother   . Diabetes Father   . Hypertension Father        ADVANCED DIRECTIVES:    HEALTH MAINTENANCE: Social History   Tobacco Use  . Smoking status: Current Every Day Smoker    Packs/day: 0.75    Years: 36.00    Pack years: 27.00    Types: Cigarettes    Start date: 06/27/1981  . Smokeless tobacco: Never Used  Substance Use Topics  . Alcohol use: No    Alcohol/week: 0.0 oz  . Drug use: No     Allergies  Allergen Reactions  . Ace Inhibitors Cough    Current Outpatient Medications  Medication Sig Dispense Refill  . aspirin 81 MG tablet Take 1 tablet by mouth daily.    . benzonatate (TESSALON) 100 MG capsule Take 1-2 capsules (100-200 mg total) by mouth 3 (three) times  daily as needed. 40 capsule 0  . buPROPion (WELLBUTRIN XL) 150 MG 24 hr tablet Take 1 tablet (150 mg total) daily by mouth. 30 tablet 2  . cyclobenzaprine (FLEXERIL) 10 MG tablet Take 0.5-1 tablets (5-10 mg total) at bedtime by mouth. 30 tablet 2  . fluticasone (FLONASE) 50 MCG/ACT nasal spray Place 2 sprays into the nose as needed.    . fluticasone furoate-vilanterol (BREO ELLIPTA) 100-25 MCG/INH AEPB Inhale 1 puff into the lungs daily. 60 each 0  . gabapentin (NEURONTIN) 300 MG capsule Take 1 capsule (300 mg  total) by mouth 3 (three) times daily. 90 capsule 2  . hydrochlorothiazide (HYDRODIURIL) 12.5 MG tablet Take 1 tablet (12.5 mg total) daily by mouth. 30 tablet 2  . Ipratropium-Albuterol (COMBIVENT RESPIMAT) 20-100 MCG/ACT AERS respimat Inhale 1 puff into the lungs as needed.    . loratadine (CLARITIN) 10 MG tablet Take 1 tablet (10 mg total) by mouth as needed for allergies. 30 tablet 2  . predniSONE (DELTASONE) 20 MG tablet Take 1 tablet (20 mg total) by mouth daily with breakfast. 10 tablet 0  . ranitidine (ZANTAC) 300 MG tablet Take 1 tablet (300 mg total) by mouth 2 (two) times daily. 60 tablet 2  . Vitamin D, Ergocalciferol, (DRISDOL) 50000 units CAPS capsule Take 1 capsule (50,000 Units total) every 7 (seven) days by mouth. 12 capsule 0  . zolpidem (AMBIEN) 10 MG tablet Take 1 tablet (10 mg total) at bedtime by mouth. 30 tablet 2   No current facility-administered medications for this visit.     OBJECTIVE: There were no vitals filed for this visit.   There is no height or weight on file to calculate BMI.    ECOG FS:0 - Asymptomatic  General: Well-developed, well-nourished, no acute distress. Eyes: Pink conjunctiva, anicteric sclera. Breasts: Patient requested exam be deferred today. Lungs: Clear to auscultation bilaterally. Heart: Regular rate and rhythm. No rubs, murmurs, or gallops. Abdomen: Soft, nontender, nondistended. No organomegaly noted, normoactive bowel sounds. Musculoskeletal: No edema, cyanosis, or clubbing. Neuro: Alert, answering all questions appropriately. Cranial nerves grossly intact. Skin: No rashes or petechiae noted. Psych: Normal affect.   LAB RESULTS:  Lab Results  Component Value Date   NA 138 05/30/2017   K 4.0 05/30/2017   CL 102 05/30/2017   CO2 29 05/30/2017   GLUCOSE 94 05/30/2017   BUN 11 05/30/2017   CREATININE 0.74 05/30/2017   CALCIUM 9.2 05/30/2017   PROT 7.0 05/30/2017   ALBUMIN 4.2 03/19/2016   AST 14 05/30/2017   ALT 10 05/30/2017    ALKPHOS 111 03/19/2016   BILITOT 0.5 05/30/2017   GFRNONAA 93 05/30/2017   GFRAA 108 05/30/2017    Lab Results  Component Value Date   WBC 8.2 05/30/2017   NEUTROABS 4,674 05/30/2017   HGB 13.5 05/30/2017   HCT 40.9 05/30/2017   MCV 82.1 05/30/2017   PLT 344 05/30/2017     STUDIES: No results found.  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   PLAN:    1.  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 Continue 5 years of anastrozole completing in September of 2018. Her most recent mammogram on January 17, 2017 was reported as BI-RADS 2. Repeat in June 2019. Return to clinic in 6 months for further evaluation at which point patient can likely be switched to yearly visits. 2.  Osteopenia: Bone mineral density on January 17, 2017 was slightly improved from one year prior to -1.8. Repeat in one year in June 2019.  Continue calcium and vitamin D supplementation.   3. Peripheral neuropathy: Continue current dose of gabapentin. Have also suggested switching to Lyrica, but patient previously declined. Consider neurology referral.  Patient expressed understanding and was in agreement with this plan. She also understands that She can call clinic at any time with any questions, concerns, or complaints.    Lloyd Huger, MD   08/12/2017 12:56 AM

## 2017-08-15 ENCOUNTER — Encounter: Payer: Self-pay | Admitting: Oncology

## 2017-08-15 ENCOUNTER — Inpatient Hospital Stay: Payer: 59 | Admitting: Oncology

## 2017-08-30 ENCOUNTER — Ambulatory Visit: Payer: 59 | Admitting: Family Medicine

## 2017-10-03 ENCOUNTER — Other Ambulatory Visit: Payer: Self-pay | Admitting: Family Medicine

## 2017-10-03 DIAGNOSIS — I10 Essential (primary) hypertension: Secondary | ICD-10-CM

## 2017-10-03 NOTE — Telephone Encounter (Signed)
Sending 30 days, she needs follow up

## 2017-10-03 NOTE — Telephone Encounter (Signed)
Spoke with patient and appt made for 10/22/17.

## 2017-10-22 ENCOUNTER — Ambulatory Visit (INDEPENDENT_AMBULATORY_CARE_PROVIDER_SITE_OTHER): Payer: 59 | Admitting: Family Medicine

## 2017-10-22 ENCOUNTER — Encounter: Payer: Self-pay | Admitting: Family Medicine

## 2017-10-22 VITALS — BP 152/82 | HR 99 | Resp 16 | Ht 63.0 in | Wt 200.7 lb

## 2017-10-22 DIAGNOSIS — G4700 Insomnia, unspecified: Secondary | ICD-10-CM | POA: Diagnosis not present

## 2017-10-22 DIAGNOSIS — I10 Essential (primary) hypertension: Secondary | ICD-10-CM | POA: Diagnosis not present

## 2017-10-22 DIAGNOSIS — J449 Chronic obstructive pulmonary disease, unspecified: Secondary | ICD-10-CM | POA: Diagnosis not present

## 2017-10-22 DIAGNOSIS — G63 Polyneuropathy in diseases classified elsewhere: Secondary | ICD-10-CM

## 2017-10-22 DIAGNOSIS — IMO0002 Reserved for concepts with insufficient information to code with codable children: Secondary | ICD-10-CM

## 2017-10-22 DIAGNOSIS — E559 Vitamin D deficiency, unspecified: Secondary | ICD-10-CM

## 2017-10-22 DIAGNOSIS — Z720 Tobacco use: Secondary | ICD-10-CM

## 2017-10-22 DIAGNOSIS — K219 Gastro-esophageal reflux disease without esophagitis: Secondary | ICD-10-CM

## 2017-10-22 MED ORDER — VITAMIN D 50 MCG (2000 UT) PO CAPS: 1.0000 | ORAL_CAPSULE | Freq: Every day | ORAL | 0 refills | Status: DC

## 2017-10-22 MED ORDER — ZOLPIDEM TARTRATE 10 MG PO TABS
10.0000 mg | ORAL_TABLET | Freq: Every day | ORAL | 2 refills | Status: DC
Start: 2017-10-22 — End: 2018-03-12

## 2017-10-22 MED ORDER — HYDROCHLOROTHIAZIDE 12.5 MG PO TABS: 12.5000 mg | ORAL_TABLET | Freq: Every day | ORAL | 2 refills | Status: DC

## 2017-10-22 NOTE — Progress Notes (Signed)
Name: Brenda Jones   MRN: [id number]    DOB: 1966-07-08   Date:10/22/2017       Progress Note  Subjective  Chief Complaint  Chief Complaint  Patient presents with  . Medication Refill  . Hypertension  . COPD  . Insomnia    HPI  HTN: taking bp medication daily, HCTZ and has somegeneralizedcramping , but she does not want to change medication, she states all the cramping started after chemotherapy not after starting medication. No chest pain, shortness of breath; edema ankles has improved. 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 Seen by Dr. Grayland Ormond, currently only calcium plus D, she had repeat bone density inJune 2018and is stable, continue supplements. Unchanged.   Insomnia: medication helps her fall and stay asleep, but wakes up at 2 am to go to work. She has been getting about 5-6 hours of sleep now, because she has been working 12 hours shifts. No longer on her part time job.  Discussed FDA and the need to try going down to 5 mg qhs but she states it took her too long to fall asleep with lower dose. She has not been taking Flexeriol   COPD:She quit smoking for about one month but resumed last week - after father diet.She  has tried Chantix and wellbutrin without success. . She has been smoking about a pack a day since she was 52yo.  No SOB with activity,no wheezing or recent cough.    Muscles Spasms: got much worse with cancer therapy - in 2013 and takes Cyclobenzaprine to control symptoms, but she stopped medication on her own. It can be on her trunk or legs, she has tried magnesium and co Q 10 without improvement of symptoms- no longer taking these. No pattern to the spasms, no worsening of frequency or severity.   Breast cancer left: seeing Dr. Grayland Ormond, s/p lumpectomy she completed 5 years of Anastrozole 03/2017. , mammogramdone 01/17/2017. She states she would  like to stop seeing Dr. Grayland Ormond because of co-pay cost. Advised her to discuss it with him on her next visit or call his office.   Peripheral Neuropathy: still has daily pain on both legs and feet also also tips of fingers. She is on Gabapentin and has been able to take it during the day;she has not noticed much of an improvement and discussed switching to Lyrica, but she is worried about cost, she does not want to see neurologist.She states pain is stable 7-8/10 all the time.   Obesity: she is gaining a little weight again, not physically active, but has a physical job, she has co-morbidities. Explained importance of calorie counting and weight loss.    Patient Active Problem List   Diagnosis Date Noted  . Osteoarthritis of right knee 08/29/2016  . Osteopenia due to cancer therapy 04/18/2015  . GERD (gastroesophageal reflux disease) 04/18/2015  . Muscle spasm 04/18/2015  . Benign neoplasm of descending colon   . Rectal polyp   . Cervical high risk HPV (human papillomavirus) test positive 03/22/2015  . Benign essential HTN 01/08/2015  . Insomnia, persistent 01/08/2015  . COPD, mild ([place]) 01/08/2015  . HLD (hyperlipidemia) 01/08/2015  . Dysmetabolic syndrome 32/67/1245  . Adult BMI 35.0-35.9 kg/sq m 01/08/2015  . Seasonal allergic rhinitis 01/08/2015  . 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 Arthralgia of multiple joints 01/08/2015    Past Surgical History:  Procedure Laterality Date  .  BLADDER SURGERY  2007  . BREAST BIOPSY Left 2013   invasive mammary carcinoma  . BREAST LUMPECTOMY Left 2013   invasive mammary carcinoma with 2 positive lymph nodes. Margins were clear.   Marland Kitchen BREAST SURGERY    . COLONOSCOPY WITH PROPOFOL N/A 04/04/2015   Procedure: COLONOSCOPY WITH PROPOFOL;  Surgeon: [name], MD;  Location: [place];  Service: Endoscopy;  Laterality: N/A;  . POLYPECTOMY  04/04/2015   Procedure: POLYPECTOMY;  Surgeon: [name], MD;  Location:  [place];  Service: Endoscopy;;    Family History  Problem Relation Age of Onset  . Heart disease Mother   . Diabetes Father   . Hypertension Father     Social History   Socioeconomic History  . Marital status: Single    Spouse name: Not on file  . Number of children: Not on file  . Years of education: Not on file  . Highest education level: 12th grade  Occupational History  . Occupation: Radio producer.   Social Needs  . Financial resource strain: Not very hard  . Food insecurity:    Worry: Never true    Inability: Never true  . Transportation needs:    Medical: No    Non-medical: No  Tobacco Use  . Smoking status: Current Every Day Smoker    Packs/day: 0.75    Years: 36.00    Pack years: 27.00    Types: Cigarettes    Start date: 06/27/1981  . Smokeless tobacco: Never Used  Substance and Sexual Activity  . Alcohol use: No    Alcohol/week: 0.0 oz  . Drug use: No  . Sexual activity: Never  Lifestyle  . Physical activity:    Days per week: 0 days    Minutes per session: 0 min  . Stress: Not at all  Relationships  . Social connections:    Talks on phone: More than three times a week    Gets together: Once a week    Attends religious service: More than 4 times per year    Active member of club or organization: No    Attends meetings of clubs or organizations: Never    Relationship status: Never married  . Intimate partner violence:    Fear of current or ex partner: No    Emotionally abused: No    Physically abused: No    Forced sexual activity: No  Other Topics Concern  . Not on file  Social History Narrative   She has one son, still lives at home, he helps her out financially also.      Current Outpatient Medications:  .  cyclobenzaprine (FLEXERIL) 10 MG tablet, Take 0.5-1 tablets (5-10 mg total) at bedtime by mouth., Disp: 30 tablet, Rfl: 2 .  fluticasone (FLONASE) 50 MCG/ACT nasal spray, Place 2 sprays into the nose as needed., Disp: ,  Rfl:  .  fluticasone furoate-vilanterol (BREO ELLIPTA) 100-25 MCG/INH AEPB, Inhale 1 puff into the lungs daily., Disp: 60 each, Rfl: 0 .  gabapentin (NEURONTIN) 300 MG capsule, Take 1 capsule (300 mg total) by mouth 3 (three) times daily., Disp: 90 capsule, Rfl: 2 .  hydrochlorothiazide (HYDRODIURIL) 12.5 MG tablet, Take 1 tablet (12.5 mg total) by mouth daily., Disp: 30 tablet, Rfl: 2 .  loratadine (CLARITIN) 10 MG tablet, Take 1 tablet (10 mg total) by mouth as needed for allergies., Disp: 30 tablet, Rfl: 2 .  ranitidine (ZANTAC) 300 MG tablet, Take 1 tablet (300 mg total) by mouth 2 (two)  times daily., Disp: 60 tablet, Rfl: 2 .  zolpidem (AMBIEN) 10 MG tablet, Take 1 tablet (10 mg total) by mouth at bedtime., Disp: 30 tablet, Rfl: 2 .  Cholecalciferol (VITAMIN D) 2000 units CAPS, Take 1 capsule (2,000 Units total) by mouth daily., Disp: 30 capsule, Rfl: 0 .  Ipratropium-Albuterol (COMBIVENT RESPIMAT) 20-100 MCG/ACT AERS respimat, Inhale 1 puff into the lungs as needed., Disp: , Rfl:   Allergies  Allergen Reactions  . Ace Inhibitors Cough     ROS  Constitutional: Negative for fever, positive for mild  weight change.  Respiratory: Negative for cough and no recent  shortness of breath.   Cardiovascular: Negative for chest pain or palpitations.  Gastrointestinal: Negative for abdominal pain, no bowel changes.  Musculoskeletal: Positive for gait problem, from left knee pain intermittently, but no joint swelling.  Skin: Negative for rash.  Neurological: Negative for dizziness or headache.  No other specific complaints in a complete review of systems (except as listed in HPI above).   Objective  Vitals:   10/22/17 1454 10/22/17 1540  BP: (!) 160/88 (!) 152/82  Pulse: 99   Resp: 16   SpO2: 98%   Weight: 200 lb 11.2 oz (91 kg)   Height: 5\' 3"  (1.6 m)     Body mass index is 35.55 kg/m.  Physical Exam  Constitutional: Patient appears well-developed and well-nourished. Obese No  distress.  HEENT: head atraumatic, normocephalic, pupils equal and reactive to light, , neck supple, throat within normal limits Cardiovascular: Normal rate, regular rhythm and normal heart sounds.  No murmur heard. No BLE edema. Pulmonary/Chest: Effort normal and breath sounds normal. No respiratory distress. Abdominal: Soft.  There is no tenderness. Psychiatric: Patient has a normal mood and affect. behavior is normal. Judgment and thought content normal.  PHQ2/9: Depression screen [facility] 2/9 10/22/2017 06/27/2017 08/29/2016 05/28/2016 02/24/2016  Decreased Interest 0 2 0 0 0  Down, Depressed, Hopeless 0 0 0 0 0  PHQ - 2 Score 0 2 0 0 0  Altered sleeping 3 3 - - -  Tired, decreased energy 2 2 - - -  Change in appetite 0 0 - - -  Feeling bad or failure about yourself  0 0 - - -  Trouble concentrating 0 0 - - -  Moving slowly or fidgety/restless 0 0 - - -  Suicidal thoughts 0 0 - - -  PHQ-9 Score 5 7 - - -  Difficult doing work/chores - Not difficult at all - - -     Fall Risk: Fall Risk  10/22/2017 06/27/2017 05/30/2017 08/29/2016 05/28/2016  Falls in the past year? No No No No No     Functional Status Survey: Is the patient deaf or have difficulty hearing?: No Does the patient have difficulty seeing, even when wearing glasses/contacts?: No Does the patient have difficulty concentrating, remembering, or making decisions?: No Does the patient have difficulty walking or climbing stairs?: No Does the patient have difficulty dressing or bathing?: No Does the patient have difficulty doing errands alone such as visiting a doctor's office or shopping?: No    Assessment & Plan   1. Chronic obstructive pulmonary disease, unspecified COPD type ([place])  Explained importance of quitting smoking, taking Breo prn   2. Insomnia, persistent  - zolpidem (AMBIEN) 10 MG tablet; Take 1 tablet (10 mg total) by mouth at bedtime.  Dispense: 30 tablet; Refill: 2  3. Benign essential HTN  BP is high, she  states father died last week, discussed  adding medication on going up on dose, but she refused at this time, she states she will monitor for now - hydrochlorothiazide (HYDRODIURIL) 12.5 MG tablet; Take 1 tablet (12.5 mg total) by mouth daily.  Dispense: 30 tablet; Refill: 2  4. Gastroesophageal reflux disease without esophagitis  Taking otc Ranitidine and doing well at this time  5. Vitamin D deficiency  - Cholecalciferol (VITAMIN D) 2000 units CAPS; Take 1 capsule (2,000 Units total) by mouth daily.  Dispense: 30 capsule; Refill: 0  6. Tobacco abuse  She said wellbutrin did not work, she quit with the patches but started back last week when father died, discussed importance of quitting again.   7. 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

## 2018-01-22 ENCOUNTER — Other Ambulatory Visit: Payer: Self-pay | Admitting: Family Medicine

## 2018-01-22 DIAGNOSIS — M62838 Other muscle spasm: Secondary | ICD-10-CM

## 2018-01-22 NOTE — Telephone Encounter (Signed)
Refill request for general medication: Flexeril 10 mg  Last office visit: 10/22/2017  Last physical exam: 06/27/2017  Follow-ups on file. 01/29/2018

## 2018-01-22 NOTE — Telephone Encounter (Signed)
Last visit in April 2019 states patient had stopped taking flexeril.  Please clarify with patient.

## 2018-01-29 ENCOUNTER — Ambulatory Visit: Payer: 59 | Admitting: Family Medicine

## 2018-03-12 ENCOUNTER — Ambulatory Visit: Payer: 59 | Admitting: Family Medicine

## 2018-03-12 ENCOUNTER — Encounter: Payer: Self-pay | Admitting: Family Medicine

## 2018-03-12 VITALS — BP 142/68 | HR 111 | Temp 98.5°F | Resp 16 | Ht 63.0 in | Wt 196.8 lb

## 2018-03-12 DIAGNOSIS — M62838 Other muscle spasm: Secondary | ICD-10-CM | POA: Diagnosis not present

## 2018-03-12 DIAGNOSIS — G4700 Insomnia, unspecified: Secondary | ICD-10-CM | POA: Diagnosis not present

## 2018-03-12 DIAGNOSIS — I10 Essential (primary) hypertension: Secondary | ICD-10-CM | POA: Diagnosis not present

## 2018-03-12 DIAGNOSIS — J449 Chronic obstructive pulmonary disease, unspecified: Secondary | ICD-10-CM | POA: Diagnosis not present

## 2018-03-12 DIAGNOSIS — E782 Mixed hyperlipidemia: Secondary | ICD-10-CM

## 2018-03-12 DIAGNOSIS — E8881 Metabolic syndrome: Secondary | ICD-10-CM

## 2018-03-12 DIAGNOSIS — IMO0002 Reserved for concepts with insufficient information to code with codable children: Secondary | ICD-10-CM

## 2018-03-12 DIAGNOSIS — G63 Polyneuropathy in diseases classified elsewhere: Secondary | ICD-10-CM

## 2018-03-12 DIAGNOSIS — Z1231 Encounter for screening mammogram for malignant neoplasm of breast: Secondary | ICD-10-CM

## 2018-03-12 DIAGNOSIS — Z1239 Encounter for other screening for malignant neoplasm of breast: Secondary | ICD-10-CM

## 2018-03-12 DIAGNOSIS — Z6835 Body mass index (BMI) 35.0-35.9, adult: Secondary | ICD-10-CM

## 2018-03-12 MED ORDER — LOSARTAN POTASSIUM-HCTZ 50-12.5 MG PO TABS: 1.0000 | ORAL_TABLET | Freq: Every day | ORAL | 3 refills | Status: DC

## 2018-03-12 MED ORDER — ZOLPIDEM TARTRATE 10 MG PO TABS: 10.0000 mg | ORAL_TABLET | Freq: Every day | ORAL | 0 refills | Status: DC

## 2018-03-12 MED ORDER — CYCLOBENZAPRINE HCL 10 MG PO TABS: 5.0000 mg | ORAL_TABLET | Freq: Every day | ORAL | 0 refills | Status: DC

## 2018-03-12 NOTE — Progress Notes (Signed)
Name: Brenda Jones   MRN: [id number]    DOB: 1965-08-26   Date:03/12/2018       Progress Note  Subjective  Chief Complaint  Chief Complaint  Patient presents with  . Follow-up    3 mth f/u  . Hypertension  . Insomnia  . COPD  . Spasms  . Osteopenia  . Breast cancer - left  . Obesity  . Peripheral Neuropathy  . Medication Refill    HPI  HTN: taking bp medication daily, HCTZ and has some generalizedcramping , she states cramping is chronic since chemo back in 2011/10/22. She was on ACE but it caused a cough, bp has been above goal, willing to try ARB/HCTZ combo. Marland Kitchen No chest pain, shortness of breath;edema ankles is stable.  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 Seen by Dr. Grayland Ormond, currently only calcium plus D, she had repeat bone density inJune 21-Oct-2016 is stable, continue supplements.Reminded her to follow up with Dr. Grayland Ormond   Insomnia: medication helps her fall and stay asleep, but wakes up at 2 am to go to work. She has been getting about 5-6 hours of sleep now, because she has been working 12 hours shifts.  Discussed FDA and the need to try going down to 5 mg qhs but she states it took her too long to fall asleep with lower dose. She denies amnesia or sleep walking with medication. Taking Ambien right before bed   COPD:She quit smoking for about one month but resumed after father died 10/21/17. She is smoking 3/4  pack a day since she was 52 yo. No SOB with activity,no wheezing or recent cough.  She has not been using inhaler lately  Muscles Spasms: got much worse with cancer therapy - in October 22, 2011 and takes Cyclobenzaprine to control symptoms, but she stopped medication on her own. It can be on her trunk or legs, she has tried magnesium and co Q 10 without improvement of symptoms, so she stopped all supplements.   History of breast cancer left: missed follow up with  Dr. Grayland Ormond this year,  s/p lumpectomyshe completed 5 years of Anastrozole 03/2017., mammogramdone  01/17/2017 I will order one today.  She states she would like to stop seeing Dr. Grayland Ormond because of co-pay cost.  Peripheral Neuropathy: still has daily pain on both legs and feet also also tips of fingers. She is on Gabapentin and has been able to take it during the day;she has not noticed much of an improvement and discussed switching to Lyrica, but she is worried about cost, she does not want to see neurologist.She states pain is stable 6-7/10 all the time.   Obesity:she has co-morbidities , explained importance of life style modification. She has lost about 5 lbs since last visit. She will try cutting down even more on soda intake  Patient Active Problem List   Diagnosis Date Noted  . Morbid obesity ([place]) 03/12/2018  . Osteoarthritis of right knee 08/29/2016  . Osteopenia due to cancer therapy 04/18/2015  . GERD (gastroesophageal reflux disease) 04/18/2015  . Muscle spasm 04/18/2015  . Benign neoplasm of descending colon   . Rectal polyp   . Cervical high risk HPV (human papillomavirus) test positive 03/22/2015  . Benign essential HTN 01/08/2015  . Insomnia, persistent 01/08/2015  . COPD, mild ([place]) 01/08/2015  . HLD (hyperlipidemia) 01/08/2015  . Dysmetabolic syndrome 05/08/5101  . Adult BMI 35.0-35.9 kg/sq m 01/08/2015  . Seasonal allergic rhinitis 01/08/2015  . 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 Arthralgia of  multiple joints 01/08/2015    Past Surgical History:  Procedure Laterality Date  . BLADDER SURGERY  2007  . BREAST BIOPSY Left 2013   invasive mammary carcinoma  . BREAST LUMPECTOMY Left 2013   invasive mammary carcinoma with 2 positive lymph nodes. Margins were clear.   Marland Kitchen BREAST SURGERY    . COLONOSCOPY WITH PROPOFOL N/A 04/04/2015   Procedure: COLONOSCOPY WITH PROPOFOL;  Surgeon: [name], MD;  Location: [facility];  Service: Endoscopy;  Laterality: N/A;  . POLYPECTOMY  04/04/2015   Procedure: POLYPECTOMY;  Surgeon: [name], MD;   Location: [facility];  Service: Endoscopy;;    Family History  Problem Relation Age of Onset  . Heart disease Mother   . Diabetes Father   . Hypertension Father     Social History   Socioeconomic History  . Marital status: Single    Spouse name: Not on file  . Number of children: 1  . Years of education: Not on file  . Highest education level: 12th grade  Occupational History  . Occupation: Radio producer.   Social Needs  . Financial resource strain: Not very hard  . Food insecurity:    Worry: Never true    Inability: Never true  . Transportation needs:    Medical: No    Non-medical: No  Tobacco Use  . Smoking status: Current Every Day Smoker    Packs/day: 0.75    Years: 36.00    Pack years: 27.00    Types: Cigarettes    Start date: 06/27/1981  . Smokeless tobacco: Never Used  Substance and Sexual Activity  . Alcohol use: No    Alcohol/week: 0.0 standard drinks  . Drug use: No  . Sexual activity: Not Currently    Partners: Male  Lifestyle  . Physical activity:    Days per week: 0 days    Minutes per session: 0 min  . Stress: Not at all  Relationships  . Social connections:    Talks on phone: More than three times a week    Gets together: Once a week    Attends religious service: More than 4 times per year    Active member of club or organization: No    Attends meetings of clubs or organizations: Never    Relationship status: Never married  . Intimate partner violence:    Fear of current or ex partner: No    Emotionally abused: No    Physically abused: No    Forced sexual activity: No  Other Topics Concern  . Not on file  Social History Narrative   She has one son, still lives at home, he helps her out financially also.      Current Outpatient Medications:  .  Cholecalciferol (VITAMIN D) 2000 units CAPS, Take 1 capsule (2,000 Units total) by mouth daily., Disp: 30 capsule, Rfl: 0 .  cyclobenzaprine (FLEXERIL) 10 MG tablet, Take 0.5-1  tablets (5-10 mg total) by mouth at bedtime., Disp: 90 tablet, Rfl: 0 .  fluticasone (FLONASE) 50 MCG/ACT nasal spray, Place 2 sprays into the nose as needed., Disp: , Rfl:  .  zolpidem (AMBIEN) 10 MG tablet, Take 1 tablet (10 mg total) by mouth at bedtime., Disp: 90 tablet, Rfl: 0 .  gabapentin (NEURONTIN) 300 MG capsule, Take 1 capsule (300 mg total) by mouth 3 (three) times daily. (Patient not taking: Reported on 03/12/2018), Disp: 90 capsule, Rfl: 2 .  Ipratropium-Albuterol (COMBIVENT RESPIMAT) 20-100 MCG/ACT AERS respimat, Inhale 1 puff into  the lungs as needed., Disp: , Rfl:  .  loratadine (CLARITIN) 10 MG tablet, Take 1 tablet (10 mg total) by mouth as needed for allergies. (Patient not taking: Reported on 03/12/2018), Disp: 30 tablet, Rfl: 2 .  losartan-hydrochlorothiazide (HYZAAR) 50-12.5 MG tablet, Take 1 tablet by mouth daily., Disp: 90 tablet, Rfl: 3  Allergies  Allergen Reactions  . Ace Inhibitors Cough     ROS  Constitutional: Negative for fever or significant  weight change.  Respiratory: Negative for cough and shortness of breath.   Cardiovascular: Negative for chest pain or palpitations.  Gastrointestinal: Negative for abdominal pain, no bowel changes.  Musculoskeletal: Negative for gait problem or joint swelling.  Skin: Negative for rash.  Neurological: Negative for dizziness or headache.  No other specific complaints in a complete review of systems (except as listed in HPI above).  Objective  Vitals:   03/12/18 0743  BP: (!) 142/68  Pulse: (!) 111  Resp: 16  Temp: 98.5 F (36.9 C)  TempSrc: Oral  SpO2: 96%  Weight: 196 lb 12.8 oz (89.3 kg)  Height: 5\' 3"  (1.6 m)    Body mass index is 34.86 kg/m.  Physical Exam  Constitutional: Patient appears well-developed and well-nourished. Obese No distress.  HEENT: head atraumatic, normocephalic, pupils equal and reactive to light, neck supple, throat within normal limits Cardiovascular: Normal rate, regular rhythm  and normal heart sounds.  No murmur heard. No BLE edema. Pulmonary/Chest: Effort normal and breath sounds normal. No respiratory distress. Abdominal: Soft.  There is no tenderness. Psychiatric: Patient has a normal mood and affect. behavior is normal. Judgment and thought content normal.  PHQ2/9: Depression screen [facility] 2/9 03/12/2018 10/22/2017 06/27/2017 08/29/2016 05/28/2016  Decreased Interest 0 0 2 0 0  Down, Depressed, Hopeless 0 0 0 0 0  PHQ - 2 Score 0 0 2 0 0  Altered sleeping 3 3 3  - -  Tired, decreased energy 1 2 2  - -  Change in appetite 0 0 0 - -  Feeling bad or failure about yourself  0 0 0 - -  Trouble concentrating 0 0 0 - -  Moving slowly or fidgety/restless 0 0 0 - -  Suicidal thoughts 0 0 0 - -  PHQ-9 Score 4 5 7  - -  Difficult doing work/chores Not difficult at all - Not difficult at all - -     Fall Risk: Fall Risk  03/12/2018 10/22/2017 06/27/2017 05/30/2017 08/29/2016  Falls in the past year? No No No No No     Functional Status Survey: Is the patient deaf or have difficulty hearing?: No Does the patient have difficulty seeing, even when wearing glasses/contacts?: No Does the patient have difficulty concentrating, remembering, or making decisions?: No Does the patient have difficulty walking or climbing stairs?: No Does the patient have difficulty dressing or bathing?: No Does the patient have difficulty doing errands alone such as visiting a doctor's office or shopping?: No   Assessment & Plan  1. Benign essential HTN  - losartan-hydrochlorothiazide (HYZAAR) 50-12.5 MG tablet; Take 1 tablet by mouth daily.  Dispense: 90 tablet; Refill: 3  2. Insomnia, persistent  - zolpidem (AMBIEN) 10 MG tablet; Take 1 tablet (10 mg total) by mouth at bedtime.  Dispense: 90 tablet; Refill: 0  3. Muscle spasm  - cyclobenzaprine (FLEXERIL) 10 MG tablet; Take 0.5-1 tablets (5-10 mg total) by mouth at bedtime.  Dispense: 90 tablet; Refill: 0  4. COPD, mild ([place])  Needs to  quit smoking  5. Secondary peripheral neuropathy (H[facility])  Advised to try gabapentin in the evenings to help with sleep but not cause side effects  6. Dysmetabolic syndrome  She would like to hold off on labs until next visit   7. Mixed hyperlipidemia  Recheck next visit   8. Adult BMI 35.0-35.9 kg/sq m  With co-morbidities = morbid obesity   9. Breast cancer screening  - MM DIGITAL SCREENING BILATERAL; Future  10. Morbid obesity ([place])  With co-morbidities = morbid obesity  She has lost 5 lbs since last visit, she will try to cut down even more on soda intake, states her aunt has gone back to [place] and no longer eating home made meals , it helps her lose weight when she is not here

## 2018-06-13 ENCOUNTER — Telehealth: Payer: Self-pay

## 2018-06-13 ENCOUNTER — Encounter: Payer: Self-pay | Admitting: Family Medicine

## 2018-06-13 ENCOUNTER — Ambulatory Visit: Payer: 59 | Admitting: Family Medicine

## 2018-06-13 VITALS — BP 124/78 | HR 100 | Temp 98.0°F | Resp 16 | Ht 63.0 in | Wt 203.3 lb

## 2018-06-13 DIAGNOSIS — M62838 Other muscle spasm: Secondary | ICD-10-CM | POA: Diagnosis not present

## 2018-06-13 DIAGNOSIS — R748 Abnormal levels of other serum enzymes: Secondary | ICD-10-CM | POA: Diagnosis not present

## 2018-06-13 DIAGNOSIS — G63 Polyneuropathy in diseases classified elsewhere: Secondary | ICD-10-CM | POA: Diagnosis not present

## 2018-06-13 DIAGNOSIS — I10 Essential (primary) hypertension: Secondary | ICD-10-CM | POA: Diagnosis not present

## 2018-06-13 DIAGNOSIS — E8881 Metabolic syndrome: Secondary | ICD-10-CM | POA: Diagnosis not present

## 2018-06-13 DIAGNOSIS — M8580 Other specified disorders of bone density and structure, unspecified site: Secondary | ICD-10-CM | POA: Diagnosis not present

## 2018-06-13 DIAGNOSIS — Z720 Tobacco use: Secondary | ICD-10-CM

## 2018-06-13 DIAGNOSIS — J449 Chronic obstructive pulmonary disease, unspecified: Secondary | ICD-10-CM

## 2018-06-13 DIAGNOSIS — M1711 Unilateral primary osteoarthritis, right knee: Secondary | ICD-10-CM

## 2018-06-13 DIAGNOSIS — R7989 Other specified abnormal findings of blood chemistry: Secondary | ICD-10-CM

## 2018-06-13 DIAGNOSIS — Z853 Personal history of malignant neoplasm of breast: Secondary | ICD-10-CM

## 2018-06-13 DIAGNOSIS — E538 Deficiency of other specified B group vitamins: Secondary | ICD-10-CM

## 2018-06-13 DIAGNOSIS — E559 Vitamin D deficiency, unspecified: Secondary | ICD-10-CM

## 2018-06-13 DIAGNOSIS — G4700 Insomnia, unspecified: Secondary | ICD-10-CM

## 2018-06-13 DIAGNOSIS — IMO0002 Reserved for concepts with insufficient information to code with codable children: Secondary | ICD-10-CM

## 2018-06-13 MED ORDER — GABAPENTIN 300 MG PO CAPS: 300.0000 mg | ORAL_CAPSULE | Freq: Every day | ORAL | 1 refills | Status: DC

## 2018-06-13 MED ORDER — CYCLOBENZAPRINE HCL 10 MG PO TABS: 5.0000 mg | ORAL_TABLET | Freq: Every day | ORAL | 0 refills | Status: DC

## 2018-06-13 MED ORDER — ZOLPIDEM TARTRATE 10 MG PO TABS: 10.0000 mg | ORAL_TABLET | Freq: Every day | ORAL | 0 refills | Status: DC

## 2018-06-13 NOTE — Progress Notes (Signed)
Name: Brenda Jones   MRN: [id number]    DOB: 04-23-1966   Date:06/13/2018       Progress Note  Subjective  Chief Complaint  Chief Complaint  Patient presents with  . Hypertension  . Follow-up    3 month recheck  . Medication Refill    HPI  HTN: taking bp medication daily, now on losartan hctz, we decreased dose of diuretic when we added losartan but did not improve cramping, she continues to have  cramping since chemo back in 2011/10/23. Marland Kitchen No chest pain, shortness of breath;edema ankles is stable. 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 Seen by Dr. Grayland Ormond, currently only calcium plus D, she had repeat bone density inJune 2016-10-22 it showed mild improvement, continue supplements.Reminded her to follow up with Dr. Grayland Ormond. She keeps saying she will call him and doesn't , so we will place a referral today   Insomnia: medication helps her fall and stay asleep, but wakes up at 2 am to go to work. She has been getting about 5-6 hours of sleep now, becauseshe has been working 12 hours shifts. Discussed FDA and the need to try going down to 5 mg qhs but she states it took her too long to fall asleep with lower dose. She denies amnesia or sleep walking with medication. Taking Ambien right before bed, denies side effects . Unchanged   COPD:She quit smoking for about one month but resumed after father died 22-Oct-2017. She is smoking 3/4  pack a day since she was 52 yo, discussed importance of trying to quit again, pneumonia vaccines up to date, but refuses flu vaccine. No SOB with activity,no wheezing or recent cough.She has not been using inhaler lately  Muscles Spasms: got much worse with cancer therapy - in 23-Oct-2011 and takes Cyclobenzaprine to control symptoms, but she stopped medication on her own. It can be on her trunk or legs, she has tried magnesium and co Q 10 without improvement of symptoms, so she stopped all supplements. Unchanged, she is  aware not to take gabapentin, flexeril and ambien or any other sedating medications at the same time  History of breast cancer left: missed follow up with  Dr. Grayland Ormond this year,  s/p lumpectomyshe completed 5 years of Anastrozole 03/2017., mammogramdone 01/17/2017 She states she would like to stop seeing Dr. Grayland Ormond because of co-pay cost. She skipped mammogram also   Peripheral Neuropathy: still has daily pain on both legs and feet also also tips of fingers. She is on Gabapentin and has been able to take it during the day;but only takes it once a day. She has not noticed much of an improvement and discussed switching to Lyrica, but she is worried about cost, she does not want to see neurologist.She states pain is stable 6-7/10 all the time.  Obesity:she has co-morbidities- OA, HTN , explained importance of life style modification. She had lost weight but now gained 5 lbs back since last visit. She is down to 20-40 ounces of sodas daily, but discussed importance of going even lower on the amount.    Patient Active Problem List   Diagnosis Date Noted  . Morbid obesity ([place]) 03/12/2018  . Osteoarthritis of right knee 08/29/2016  . Osteopenia due to cancer therapy 04/18/2015  . GERD (gastroesophageal reflux disease) 04/18/2015  . Muscle spasm 04/18/2015  . Benign neoplasm of descending colon   . Rectal polyp   . Cervical high risk HPV (human papillomavirus)  test positive 03/22/2015  . Benign essential HTN 01/08/2015  . Insomnia, persistent 01/08/2015  . COPD, mild ([place]) 01/08/2015  . HLD (hyperlipidemia) 01/08/2015  . Dysmetabolic syndrome 58/52/7782  . Adult BMI 35.0-35.9 kg/sq m 01/08/2015  . Seasonal allergic rhinitis 01/08/2015  . Secondary peripheral neuropathy ([place]) 01/08/2015  . Arthralgia of multiple joints 01/08/2015    Past Surgical History:  Procedure Laterality Date  . BLADDER SURGERY  2007  . BREAST BIOPSY Left 2013   invasive mammary carcinoma  . BREAST  LUMPECTOMY Left 2013   invasive mammary carcinoma with 2 positive lymph nodes. Margins were clear.   Marland Kitchen BREAST SURGERY    . COLONOSCOPY WITH PROPOFOL N/A 04/04/2015   Procedure: COLONOSCOPY WITH PROPOFOL;  Surgeon: [name], MD;  Location: [facility];  Service: Endoscopy;  Laterality: N/A;  . POLYPECTOMY  04/04/2015   Procedure: POLYPECTOMY;  Surgeon: [name], MD;  Location: [facility];  Service: Endoscopy;;    Family History  Problem Relation Age of Onset  . Heart disease Mother   . Diabetes Father   . Hypertension Father     Social History   Socioeconomic History  . Marital status: Single    Spouse name: Not on file  . Number of children: 1  . Years of education: Not on file  . Highest education level: 12th grade  Occupational History  . Occupation: Radio producer.   Social Needs  . Financial resource strain: Not very hard  . Food insecurity:    Worry: Never true    Inability: Never true  . Transportation needs:    Medical: No    Non-medical: No  Tobacco Use  . Smoking status: Current Every Day Smoker    Packs/day: 0.75    Years: 36.00    Pack years: 27.00    Types: Cigarettes    Start date: 06/27/1981  . Smokeless tobacco: Never Used  Substance and Sexual Activity  . Alcohol use: No    Alcohol/week: 0.0 standard drinks  . Drug use: No  . Sexual activity: Not Currently    Partners: Male  Lifestyle  . Physical activity:    Days per week: 0 days    Minutes per session: 0 min  . Stress: Not at all  Relationships  . Social connections:    Talks on phone: More than three times a week    Gets together: Once a week    Attends religious service: More than 4 times per year    Active member of club or organization: No    Attends meetings of clubs or organizations: Never    Relationship status: Never married  . Intimate partner violence:    Fear of current or ex partner: No    Emotionally abused: No    Physically abused: No    Forced  sexual activity: No  Other Topics Concern  . Not on file  Social History Narrative   She has one son, still lives at home, he helps her out financially also.      Current Outpatient Medications:  .  Cholecalciferol (VITAMIN D) 2000 units CAPS, Take 1 capsule (2,000 Units total) by mouth daily., Disp: 30 capsule, Rfl: 0 .  cyclobenzaprine (FLEXERIL) 10 MG tablet, Take 0.5-1 tablets (5-10 mg total) by mouth at bedtime., Disp: 90 tablet, Rfl: 0 .  fluticasone (FLONASE) 50 MCG/ACT nasal spray, Place 2 sprays into the nose as needed., Disp: , Rfl:  .  gabapentin (NEURONTIN) 300 MG capsule, Take 1 capsule (  300 mg total) by mouth 3 (three) times daily., Disp: 90 capsule, Rfl: 2 .  Ipratropium-Albuterol (COMBIVENT RESPIMAT) 20-100 MCG/ACT AERS respimat, Inhale 1 puff into the lungs as needed., Disp: , Rfl:  .  loratadine (CLARITIN) 10 MG tablet, Take 1 tablet (10 mg total) by mouth as needed for allergies., Disp: 30 tablet, Rfl: 2 .  losartan-hydrochlorothiazide (HYZAAR) 50-12.5 MG tablet, Take 1 tablet by mouth daily., Disp: 90 tablet, Rfl: 3 .  zolpidem (AMBIEN) 10 MG tablet, Take 1 tablet (10 mg total) by mouth at bedtime., Disp: 90 tablet, Rfl: 0  Allergies  Allergen Reactions  . Ace Inhibitors Cough    I personally reviewed active problem list, medication list, allergies, family history, social history with the patient/caregiver today.   ROS  Constitutional: Negative for fever or weight change.  Respiratory: Negative for cough and shortness of breath.   Cardiovascular: Negative for chest pain or palpitations.  Gastrointestinal: Negative for abdominal pain, no bowel changes.  Musculoskeletal: Negative for gait problem or joint swelling.  Skin: Negative for rash.  Neurological: Negative for dizziness or headache.  No other specific complaints in a complete review of systems (except as listed in HPI above).  Objective  Vitals:   06/13/18 0734  BP: 124/78  Pulse: 100  Resp: 16   Temp: 98 F (36.7 C)  TempSrc: Oral  SpO2: 99%  Weight: 203 lb 4.8 oz (92.2 kg)  Height: 5\' 3"  (1.6 m)    Body mass index is 36.01 kg/m.  Physical Exam  Constitutional: Patient appears well-developed and well-nourished. Obese No distress.  HEENT: head atraumatic, normocephalic, pupils equal and reactive to light, e neck supple, throat within normal limits Cardiovascular: Normal rate, regular rhythm and normal heart sounds.  No murmur heard. No BLE edema. Pulmonary/Chest: Effort normal and breath sounds normal. No respiratory distress. Abdominal: Soft.  There is no tenderness. Psychiatric: Patient has a normal mood and affect. behavior is normal. Judgment and thought content normal.  PHQ2/9: Depression screen [facility] 2/9 06/13/2018 03/12/2018 10/22/2017 06/27/2017 08/29/2016  Decreased Interest 0 0 0 2 0  Down, Depressed, Hopeless 0 0 0 0 0  PHQ - 2 Score 0 0 0 2 0  Altered sleeping 0 3 3 3  -  Tired, decreased energy 0 1 2 2  -  Change in appetite 0 0 0 0 -  Feeling bad or failure about yourself  0 0 0 0 -  Trouble concentrating 0 0 0 0 -  Moving slowly or fidgety/restless 0 0 0 0 -  Suicidal thoughts 0 0 0 0 -  PHQ-9 Score 0 4 5 7  -  Difficult doing work/chores Not difficult at all Not difficult at all - Not difficult at all -     Fall Risk: Fall Risk  06/13/2018 03/12/2018 10/22/2017 06/27/2017 05/30/2017  Falls in the past year? 0 No No No No  Number falls in past yr: 0 - - - -  Injury with Fall? 0 - - - -      Assessment & Plan  1. Secondary peripheral neuropathy (HCC)  - gabapentin (NEURONTIN) 300 MG capsule; Take 1 capsule (300 mg total) by mouth daily.  Dispense: 90 capsule; Refill: 1  2. Muscle spasm  - cyclobenzaprine (FLEXERIL) 10 MG tablet; Take 0.5-1 tablets (5-10 mg total) by mouth at bedtime.  Dispense: 90 tablet; Refill: 0  3. Benign essential HTN  Continue medication  - COMPLETE METABOLIC PANEL WITH GFR - CBC with Differential/Platelet  4. COPD, mild  ([place])  Explained importance of getting flu vaccine but she refused  5. Insomnia, persistent  - zolpidem (AMBIEN) 10 MG tablet; Take 1 tablet (10 mg total) by mouth at bedtime.  Dispense: 90 tablet; Refill: 0  6. Osteopenia due to cancer therapy  Improved after she finished chemotherapy   7. Primary osteoarthritis of right knee  Losing weight would help with symptoms   8. Dysmetabolic syndrome  Recheck labs  - Hemoglobin A1c  9. Tobacco abuse  Discussed quit hotline  10. Low serum HDL  - Lipid panel  11. Vitamin D deficiency  - VITAMIN D 25 Hydroxy (Vit-D Deficiency, Fractures)  12. Low vitamin B12 level  - Vitamin B12  13. History of breast cancer in female  - Ambulatory referral to Hematology

## 2018-06-13 NOTE — Telephone Encounter (Signed)
Patient was informed that she has been scheduled for her mammo on 07/04/18 @ 3:20pm in [place] ([place])

## 2018-06-14 LAB — COMPLETE METABOLIC PANEL WITH GFR
AG Ratio: 1.4 (calc) (ref 1.0–2.5)
ALBUMIN MSPROF: 4 g/dL (ref 3.6–5.1)
ALKALINE PHOSPHATASE (APISO): 83 U/L (ref 33–130)
ALT: 13 U/L (ref 6–29)
AST: 16 U/L (ref 10–35)
BILIRUBIN TOTAL: 0.6 mg/dL (ref 0.2–1.2)
BUN: 10 mg/dL (ref 7–25)
CO2: 28 mmol/L (ref 20–32)
Calcium: 9.2 mg/dL (ref 8.6–10.4)
Chloride: 103 mmol/L (ref 98–110)
Creat: 0.82 mg/dL (ref 0.50–1.05)
GFR, EST AFRICAN AMERICAN: 95 mL/min/{1.73_m2} (ref >=60)
GFR, Est Non African American: 82 mL/min/{1.73_m2} (ref >=60)
GLOBULIN: 2.8 g/dL (ref 1.9–3.7)
GLUCOSE: 82 mg/dL (ref 65–99)
Potassium: 4 mmol/L (ref 3.5–5.3)
SODIUM: 137 mmol/L (ref 135–146)
TOTAL PROTEIN: 6.8 g/dL (ref 6.1–8.1)

## 2018-06-14 LAB — CBC WITH DIFFERENTIAL/PLATELET
BASOS ABS: 60 {cells}/uL (ref 0–200)
Basophils Relative: 0.8 %
Eosinophils Absolute: 143 cells/uL (ref 15–500)
Eosinophils Relative: 1.9 %
HEMATOCRIT: 41.6 % (ref 35.0–45.0)
Hemoglobin: 14.4 g/dL (ref 11.7–15.5)
LYMPHS ABS: 2678 {cells}/uL (ref 850–3900)
MCH: 28.3 pg (ref 27.0–33.0)
MCHC: 34.6 g/dL (ref 32.0–36.0)
MCV: 81.9 fL (ref 80.0–100.0)
MPV: 10.7 fL (ref 7.5–12.5)
Monocytes Relative: 6.7 %
NEUTROS PCT: 54.9 %
Neutro Abs: 4118 cells/uL (ref 1500–7800)
PLATELETS: 341 10*3/uL (ref 140–400)
RBC: 5.08 10*6/uL (ref 3.80–5.10)
RDW: 13.9 % (ref 11.0–15.0)
TOTAL LYMPHOCYTE: 35.7 %
WBC: 7.5 10*3/uL (ref 3.8–10.8)
WBCMIX: 503 {cells}/uL (ref 200–950)

## 2018-06-14 LAB — LIPID PANEL
CHOLESTEROL: 128 mg/dL (ref <200)
HDL: 40 mg/dL — AB (ref >50)
LDL CHOLESTEROL (CALC): 74 mg/dL
Non-HDL Cholesterol (Calc): 88 mg/dL (calc) (ref <130)
TRIGLYCERIDES: 55 mg/dL (ref <150)
Total CHOL/HDL Ratio: 3.2 (calc) (ref <5.0)

## 2018-06-14 LAB — HEMOGLOBIN A1C
HEMOGLOBIN A1C: 6.1 %{Hb} — AB (ref <5.7)
Mean Plasma Glucose: 128 (calc)
eAG (mmol/L): 7.1 (calc)

## 2018-06-14 LAB — VITAMIN B12: Vitamin B-12: 512 pg/mL (ref 200–1100)

## 2018-06-14 LAB — VITAMIN D 25 HYDROXY (VIT D DEFICIENCY, FRACTURES): Vit D, 25-Hydroxy: 13 ng/mL — ABNORMAL LOW (ref 30–100)

## 2018-06-15 ENCOUNTER — Other Ambulatory Visit: Payer: Self-pay | Admitting: Family Medicine

## 2018-06-15 MED ORDER — VITAMIN D (ERGOCALCIFEROL) 1.25 MG (50000 UNIT) PO CAPS: 50000.0000 [IU] | ORAL_CAPSULE | ORAL | 0 refills | Status: DC

## 2018-06-30 ENCOUNTER — Other Ambulatory Visit (HOSPITAL_COMMUNITY)
Admission: RE | Admit: 2018-06-30 | Discharge: 2018-06-30 | Disposition: A | Discharge status: Home / Self Care | Payer: 59 | Source: Ambulatory Visit | Attending: Family Medicine | Admitting: Family Medicine

## 2018-06-30 ENCOUNTER — Ambulatory Visit (INDEPENDENT_AMBULATORY_CARE_PROVIDER_SITE_OTHER): Payer: 59 | Admitting: Family Medicine

## 2018-06-30 ENCOUNTER — Encounter: Payer: Self-pay | Admitting: Family Medicine

## 2018-06-30 VITALS — BP 142/78 | HR 90 | Temp 97.9°F | Resp 16 | Ht 63.0 in | Wt 203.8 lb

## 2018-06-30 DIAGNOSIS — Z124 Encounter for screening for malignant neoplasm of cervix: Secondary | ICD-10-CM | POA: Diagnosis not present

## 2018-06-30 DIAGNOSIS — Z01419 Encounter for gynecological examination (general) (routine) without abnormal findings: Secondary | ICD-10-CM | POA: Diagnosis not present

## 2018-06-30 DIAGNOSIS — Z23 Encounter for immunization: Secondary | ICD-10-CM | POA: Diagnosis not present

## 2018-06-30 DIAGNOSIS — M810 Age-related osteoporosis without current pathological fracture: Secondary | ICD-10-CM

## 2018-06-30 DIAGNOSIS — R87810 Cervical high risk human papillomavirus (HPV) DNA test positive: Secondary | ICD-10-CM

## 2018-06-30 DIAGNOSIS — Z780 Asymptomatic menopausal state: Secondary | ICD-10-CM

## 2018-06-30 NOTE — Patient Instructions (Signed)
Preventive Care 40-64 Years, Female Preventive care refers to lifestyle choices and visits with your health care provider that can promote health and wellness. What does preventive care include?  A yearly physical exam. This is also called an annual well check.  Dental exams once or twice a year.  Routine eye exams. Ask your health care provider how often you should have your eyes checked.  Personal lifestyle choices, including: ? Daily care of your teeth and gums. ? Regular physical activity. ? Eating a healthy diet. ? Avoiding tobacco and drug use. ? Limiting alcohol use. ? Practicing safe sex. ? Taking low-dose aspirin daily starting at age 58. ? Taking vitamin and mineral supplements as recommended by your health care provider. What happens during an annual well check? The services and screenings done by your health care provider during your annual well check will depend on your age, overall health, lifestyle risk factors, and family history of disease. Counseling Your health care provider may ask you questions about your:  Alcohol use.  Tobacco use.  Drug use.  Emotional well-being.  Home and relationship well-being.  Sexual activity.  Eating habits.  Work and work Statistician.  Method of birth control.  Menstrual cycle.  Pregnancy history.  Screening You may have the following tests or measurements:  Height, weight, and BMI.  Blood pressure.  Lipid and cholesterol levels. These may be checked every 5 years, or more frequently if you are over 81 years old.  Skin check.  Lung cancer screening. You may have this screening every year starting at age 78 if you have a 30-pack-year history of smoking and currently smoke or have quit within the past 15 years.  Fecal occult blood test (FOBT) of the stool. You may have this test every year starting at age 65.  Flexible sigmoidoscopy or colonoscopy. You may have a sigmoidoscopy every 5 years or a colonoscopy  every 10 years starting at age 30.  Hepatitis C blood test.  Hepatitis B blood test.  Sexually transmitted disease (STD) testing.  Diabetes screening. This is done by checking your blood sugar (glucose) after you have not eaten for a while (fasting). You may have this done every 1-3 years.  Mammogram. This may be done every 1-2 years. Talk to your health care provider about when you should start having regular mammograms. This may depend on whether you have a family history of breast cancer.  BRCA-related cancer screening. This may be done if you have a family history of breast, ovarian, tubal, or peritoneal cancers.  Pelvic exam and Pap test. This may be done every 3 years starting at age 80. Starting at age 36, this may be done every 5 years if you have a Pap test in combination with an HPV test.  Bone density scan. This is done to screen for osteoporosis. You may have this scan if you are at high risk for osteoporosis.  Discuss your test results, treatment options, and if necessary, the need for more tests with your health care provider. Vaccines Your health care provider may recommend certain vaccines, such as:  Influenza vaccine. This is recommended every year.  Tetanus, diphtheria, and acellular pertussis (Tdap, Td) vaccine. You may need a Td booster every 10 years.  Varicella vaccine. You may need this if you have not been vaccinated.  Zoster vaccine. You may need this after age 5.  Measles, mumps, and rubella (MMR) vaccine. You may need at least one dose of MMR if you were born in  1957 or later. You may also need a second dose.  Pneumococcal 13-valent conjugate (PCV13) vaccine. You may need this if you have certain conditions and were not previously vaccinated.  Pneumococcal polysaccharide (PPSV23) vaccine. You may need one or two doses if you smoke cigarettes or if you have certain conditions.  Meningococcal vaccine. You may need this if you have certain  conditions.  Hepatitis A vaccine. You may need this if you have certain conditions or if you travel or work in places where you may be exposed to hepatitis A.  Hepatitis B vaccine. You may need this if you have certain conditions or if you travel or work in places where you may be exposed to hepatitis B.  Haemophilus influenzae type b (Hib) vaccine. You may need this if you have certain conditions.  Talk to your health care provider about which screenings and vaccines you need and how often you need them. This information is not intended to replace advice given to you by your health care provider. Make sure you discuss any questions you have with your health care provider. Document Released: 08/05/2015 Document Revised: 03/28/2016 Document Reviewed: 05/10/2015 Elsevier Interactive Patient Education  2018 Elsevier Inc.  

## 2018-06-30 NOTE — Progress Notes (Signed)
Name: Brenda Jones   MRN: [id number]    DOB: Mar 20, 1966   Date:06/30/2018       Progress Note  Subjective  Chief Complaint  Chief Complaint  Patient presents with  . Annual Exam    HPI   Patient presents for annual CPE   Diet: she eats unhealthy, reminded her of diagnosis of pre-diabetes Exercise: she has a physical job    Office Visit from 06/13/2018 in [facility]  AUDIT-C Score  0     Depression:  Depression screen [facility] 2/9 06/13/2018 03/12/2018 10/22/2017 06/27/2017 08/29/2016  Decreased Interest 0 0 0 2 0  Down, Depressed, Hopeless 0 0 0 0 0  PHQ - 2 Score 0 0 0 2 0  Altered sleeping 0 _0 -  Tired, decreased energy 0 _1 -  Change in appetite 0 0 0 0 -  Feeling bad or failure about yourself  0 0 0 0 -  Trouble concentrating 0 0 0 0 -  Moving slowly or fidgety/restless 0 0 0 0 -  Suicidal thoughts 0 0 0 0 -  PHQ-9 Score 0 _2 -  Difficult doing work/chores Not difficult at all Not difficult at all - Not difficult at all -   Hypertension: BP Readings from Last 3 Encounters:  06/30/18 (!) 142/78  06/13/18 124/78  03/12/18 (!) 142/68   Obesity: Wt Readings from Last 3 Encounters:  06/30/18 203 lb 12.8 oz (92.4 kg)  06/13/18 203 lb 4.8 oz (92.2 kg)  03/12/18 196 lb 12.8 oz (89.3 kg)   BMI Readings from Last 3 Encounters:  06/30/18 36.10 kg/m  06/13/18 36.01 kg/m  03/12/18 34.86 kg/m    Hep C Screening: next visit  STD testing and prevention (HIV/chl/gon/syphilis): not sexually for over 5 years  Intimate partner violence: negative screen  Sexual History/Pain during Intercourse: not currently sexually Menstrual History/LMP/Abnormal Bleeding:discussed importance of follow up in case of post-menopausal bleeding  Incontinence Symptoms: she has urgency at times  Advanced Care Planning: A voluntary discussion about advance care planning including the explanation and discussion of advance directives.  Discussed health care proxy and Living  will, and the patient was able to identify a health care proxy as .  Patient does not have a living will at present time.  Breast cancer:  HM Mammogram  Date Value Ref Range Status  08/16/2013 Normal  Final    BRCA gene screening: she was genetic tested while getting treatment for breast cancer  Cervical cancer screening: today   Osteoporosis Screening: schedule bone density    Lipids:  Lab Results  Component Value Date   CHOL 128 06/13/2018   CHOL 142 05/30/2017   CHOL 133 03/19/2016   Lab Results  Component Value Date   HDL 40 (L) 06/13/2018   HDL 47 (L) 05/30/2017   HDL 49 03/19/2016   Lab Results  Component Value Date   LDLCALC 74 06/13/2018   LDLCALC 79 05/30/2017   LDLCALC 69 03/19/2016   Lab Results  Component Value Date   TRIG 55 06/13/2018   TRIG 79 05/30/2017   TRIG 73 03/19/2016   Lab Results  Component Value Date   CHOLHDL 3.2 06/13/2018   CHOLHDL 3.0 05/30/2017   CHOLHDL 2.7 03/19/2016   No results found for: LDLDIRECT  Glucose:  Glucose  Date Value Ref Range Status  01/25/2012 148 (H) 65 - 99 mg/dL Final  01/17/2012 124 (H) 65 - 99 mg/dL Final  01/11/2012 116 (  H) 65 - 99 mg/dL Final   Glucose, Bld  Date Value Ref Range Status  06/13/2018 82 65 - 99 mg/dL Final    Comment:    .            Fasting reference interval .   05/30/2017 94 65 - 99 mg/dL Final    Comment:    .            Fasting reference interval .   03/19/2016 94 65 - 99 mg/dL Final    Skin cancer: discussed atypical moles  Colorectal cancer: repeat in 2021 Lung cancer:   Low Dose CT Chest recommended if Age 33-80 years, 30 pack-year currently smoking OR have quit w/in 15years. Patient does not qualify based on age ECG: done in 2013 , she does not want to repeat it at this time  Patient Active Problem List   Diagnosis Date Noted  . Morbid obesity ([place]) 03/12/2018  . Osteoarthritis of right knee 08/29/2016  . Osteopenia due to cancer therapy 04/18/2015  . GERD  (gastroesophageal reflux disease) 04/18/2015  . Muscle spasm 04/18/2015  . Benign neoplasm of descending colon   . Cervical high risk HPV (human papillomavirus) test positive 03/22/2015  . Benign essential HTN 01/08/2015  . Insomnia, persistent 01/08/2015  . COPD, mild ([place]) 01/08/2015  . HLD (hyperlipidemia) 01/08/2015  . Dysmetabolic syndrome 09/32/6712  . Severe obesity (BMI 35.0-35.9 with comorbidity) ([place]) 01/08/2015  . Seasonal allergic rhinitis 01/08/2015  . 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 Arthralgia of multiple joints 01/08/2015    Past Surgical History:  Procedure Laterality Date  . BLADDER SURGERY  2007  . BREAST BIOPSY Left 2013   invasive mammary carcinoma  . BREAST LUMPECTOMY Left 2013   invasive mammary carcinoma with 2 positive lymph nodes. Margins were clear.   Marland Kitchen BREAST SURGERY    . COLONOSCOPY WITH PROPOFOL N/A 04/04/2015   Procedure: COLONOSCOPY WITH PROPOFOL;  Surgeon: [name], MD;  Location: [place];  Service: Endoscopy;  Laterality: N/A;  . POLYPECTOMY  04/04/2015   Procedure: POLYPECTOMY;  Surgeon: [name], MD;  Location: [place];  Service: Endoscopy;;    Family History  Problem Relation Age of Onset  . Heart disease Mother   . Diabetes Father   . Hypertension Father     Social History   Socioeconomic History  . Marital status: Single    Spouse name: Not on file  . Number of children: 1  . Years of education: Not on file  . Highest education level: 12th grade  Occupational History  . Occupation: Radio producer.   Social Needs  . Financial resource strain: Not very hard  . Food insecurity:    Worry: Never true    Inability: Never true  . Transportation needs:    Medical: No    Non-medical: No  Tobacco Use  . Smoking status: Current Every Day Smoker    Packs/day: 0.75    Years: 36.00    Pack years: 27.00    Types: Cigarettes    Start date: 06/27/1981  . Smokeless tobacco: Never Used   Substance and Sexual Activity  . Alcohol use: No    Alcohol/week: 0.0 standard drinks  . Drug use: No  . Sexual activity: Not Currently    Partners: Male  Lifestyle  . Physical activity:    Days per week: 0 days    Minutes per session: 0 min  . Stress: Not at all  Relationships  .  Social connections:    Talks on phone: More than three times a week    Gets together: Once a week    Attends religious service: More than 4 times per year    Active member of club or organization: No    Attends meetings of clubs or organizations: Never    Relationship status: Never married  . Intimate partner violence:    Fear of current or ex partner: No    Emotionally abused: No    Physically abused: No    Forced sexual activity: No  Other Topics Concern  . Not on file  Social History Narrative   She has one son, still lives at home, he helps her out financially also.      Current Outpatient Medications:  .  Cholecalciferol (VITAMIN D) 2000 units CAPS, Take 1 capsule (2,000 Units total) by mouth daily., Disp: 30 capsule, Rfl: 0 .  cyclobenzaprine (FLEXERIL) 10 MG tablet, Take 0.5-1 tablets (5-10 mg total) by mouth at bedtime., Disp: 90 tablet, Rfl: 0 .  fluticasone (FLONASE) 50 MCG/ACT nasal spray, Place 2 sprays into the nose as needed., Disp: , Rfl:  .  gabapentin (NEURONTIN) 300 MG capsule, Take 1 capsule (300 mg total) by mouth daily., Disp: 90 capsule, Rfl: 1 .  Ipratropium-Albuterol (COMBIVENT RESPIMAT) 20-100 MCG/ACT AERS respimat, Inhale 1 puff into the lungs as needed., Disp: , Rfl:  .  loratadine (CLARITIN) 10 MG tablet, Take 1 tablet (10 mg total) by mouth as needed for allergies., Disp: 30 tablet, Rfl: 2 .  losartan-hydrochlorothiazide (HYZAAR) 50-12.5 MG tablet, Take 1 tablet by mouth daily., Disp: 90 tablet, Rfl: 3 .  Vitamin D, Ergocalciferol, (DRISDOL) 1.25 MG (50000 UT) CAPS capsule, Take 1 capsule (50,000 Units total) by mouth every 7 (seven) days., Disp: 12 capsule, Rfl: 0 .   zolpidem (AMBIEN) 10 MG tablet, Take 1 tablet (10 mg total) by mouth at bedtime., Disp: 90 tablet, Rfl: 0  Allergies  Allergen Reactions  . Ace Inhibitors Cough     ROS  Constitutional: Negative for fever or weight change.  Respiratory: Negative for cough and shortness of breath.   Cardiovascular: Negative for chest pain or palpitations.  Gastrointestinal: Negative for abdominal pain, no bowel changes.  Musculoskeletal: Negative for gait problem or joint swelling.  Skin: Negative for rash.  Neurological: Negative for dizziness or headache.  No other specific complaints in a complete review of systems (except as listed in HPI above).  Objective  Vitals:   06/30/18 0924  BP: (!) 142/78  Pulse: 90  Resp: 16  Temp: 97.9 F (36.6 C)  TempSrc: Oral  SpO2: 99%  Weight: 203 lb 12.8 oz (92.4 kg)  Height: 5' 3" (1.6 m)    Body mass index is 36.1 kg/m.  Physical Exam  Constitutional: Patient appears well-developed and obese No distress.  HENT: Head: Normocephalic and atraumatic. Ears: B TMs ok, no erythema or effusion; Nose: Nose normal. Mouth/Throat: Oropharynx is clear and moist. No oropharyngeal exudate.  Eyes: Conjunctivae and EOM are normal. Pupils are equal, round, and reactive to light. No scleral icterus.  Neck: Normal range of motion. Neck supple. No JVD present. No thyromegaly present.  Cardiovascular: Normal rate, regular rhythm and normal heart sounds.  No murmur heard. No BLE edema. Pulmonary/Chest: Effort normal and breath sounds normal. No respiratory distress. Abdominal: Soft. Bowel sounds are normal, no distension. There is no tenderness. no masses Breast: no lumps or masses, no nipple discharge or rashes FEMALE GENITALIA:  External genitalia  normal External urethra normal Vaginal vault normal without discharge or lesions Cervix normal without discharge or lesions Bimanual exam normal without masses RECTAL: not done  Musculoskeletal: Normal range of motion,  no joint effusions. No gross deformities Neurological: he is alert and oriented to person, place, and time. No cranial nerve deficit. Coordination, balance, strength, speech and gait are normal.  Skin: Skin is warm and dry. No rash noted. No erythema.  Psychiatric: Patient has a normal mood and affect. behavior is normal. Judgment and thought content normal.   Recent Results (from the past 2160 hour(s))  Lipid panel     Status: Abnormal   Collection Time: 06/13/18  8:18 AM  Result Value Ref Range   Cholesterol 128 <200 mg/dL   HDL 40 (L) >50 mg/dL   Triglycerides 55 <150 mg/dL   LDL Cholesterol (Calc) 74 mg/dL (calc)    Comment: Reference range: <100 . Desirable range <100 mg/dL for primary prevention;   <70 mg/dL for patients with CHD or diabetic patients  with > or = 2 CHD risk factors. Marland Kitchen LDL-C is now calculated using the Martin-Hopkins  calculation, which is a validated novel method providing  better accuracy than the Friedewald equation in the  estimation of LDL-C.  Cresenciano Genre et al. Annamaria Helling. 7408;144(81): 2061-2068  (http://education.[facility].com/faq/FAQ164)    Total CHOL/HDL Ratio 3.2 <5.0 (calc)   Non-HDL Cholesterol (Calc) 88 <130 mg/dL (calc)    Comment: For patients with diabetes plus 1 major ASCVD risk  factor, treating to a non-HDL-C goal of <100 mg/dL  (LDL-C of <70 mg/dL) is considered a therapeutic  option.   Hemoglobin A1c     Status: Abnormal   Collection Time: 06/13/18  8:18 AM  Result Value Ref Range   Hgb A1c MFr Bld 6.1 (H) <5.7 % of total Hgb    Comment: For someone without known diabetes, a hemoglobin  A1c value between 5.7% and 6.4% is consistent with prediabetes and should be confirmed with a  follow-up test. . For someone with known diabetes, a value <7% indicates that their diabetes is well controlled. A1c targets should be individualized based on duration of diabetes, age, comorbid conditions, and other considerations. . This assay  result is consistent with an increased risk of diabetes. . Currently, no consensus exists regarding use of hemoglobin A1c for diagnosis of diabetes for children. .    Mean Plasma Glucose 128 (calc)   eAG (mmol/L) 7.1 (calc)  COMPLETE METABOLIC PANEL WITH GFR     Status: None   Collection Time: 06/13/18  8:18 AM  Result Value Ref Range   Glucose, Bld 82 65 - 99 mg/dL    Comment: .            Fasting reference interval .    BUN 10 7 - 25 mg/dL   Creat 0.82 0.50 - 1.05 mg/dL    Comment: For patients >34 years of age, the reference limit for Creatinine is approximately 13% higher for people identified as African-American. .    GFR, Est Non African American 82 > OR = 60 mL/min/1.75m   GFR, Est African American 95 > OR = 60 mL/min/1.750m  BUN/Creatinine Ratio NOT APPLICABLE 6 - 22 (calc)   Sodium 137 135 - 146 mmol/L   Potassium 4.0 3.5 - 5.3 mmol/L   Chloride 103 98 - 110 mmol/L   CO2 28 20 - 32 mmol/L   Calcium 9.2 8.6 - 10.4 mg/dL   Total Protein 6.8 6.1 - 8.1 g/dL  Albumin 4.0 3.6 - 5.1 g/dL   Globulin 2.8 1.9 - 3.7 g/dL (calc)   AG Ratio 1.4 1.0 - 2.5 (calc)   Total Bilirubin 0.6 0.2 - 1.2 mg/dL   Alkaline phosphatase (APISO) 83 33 - 130 U/L   AST 16 10 - 35 U/L   ALT 13 6 - 29 U/L  CBC with Differential/Platelet     Status: None   Collection Time: 06/13/18  8:18 AM  Result Value Ref Range   WBC 7.5 3.8 - 10.8 Thousand/uL   RBC 5.08 3.80 - 5.10 Million/uL   Hemoglobin 14.4 11.7 - 15.5 g/dL   HCT 41.6 35.0 - 45.0 %   MCV 81.9 80.0 - 100.0 fL   MCH 28.3 27.0 - 33.0 pg   MCHC 34.6 32.0 - 36.0 g/dL   RDW 13.9 11.0 - 15.0 %   Platelets 341 140 - 400 Thousand/uL   MPV 10.7 7.5 - 12.5 fL   Neutro Abs 4,118 1,500 - 7,800 cells/uL   Lymphs Abs 2,678 850 - 3,900 cells/uL   WBC mixed population 503 200 - 950 cells/uL   Eosinophils Absolute 143 15 - 500 cells/uL   Basophils Absolute 60 0 - 200 cells/uL   Neutrophils Relative % 54.9 %   Total Lymphocyte 35.7 %    Monocytes Relative 6.7 %   Eosinophils Relative 1.9 %   Basophils Relative 0.8 %  VITAMIN D 25 Hydroxy (Vit-D Deficiency, Fractures)     Status: Abnormal   Collection Time: 06/13/18  8:18 AM  Result Value Ref Range   Vit D, 25-Hydroxy 13 (L) 30 - 100 ng/mL    Comment: Vitamin D Status         25-OH Vitamin D: . Deficiency:                    <20 ng/mL Insufficiency:             20 - 29 ng/mL Optimal:                 > or = 30 ng/mL . For 25-OH Vitamin D testing on patients on  D2-supplementation and patients for whom quantitation  of D2 and D3 fractions is required, the [facility]ureD(TM) 25-OH VIT D, (D2,D3), LC/MS/MS is recommended: order  code [id number] (patients >106yr). . For more information on this test, go to: http://education.[facility].com/faq/FAQ163 (This link is being provided for  informational/educational purposes only.)   Vitamin B12     Status: None   Collection Time: 06/13/18  8:18 AM  Result Value Ref Range   Vitamin B-12 512 200 - 1,100 pg/mL    [facility] 06/13/2018 03/12/2018 10/22/2017 06/27/2017 08/29/2016  Decreased Interest 0 0 0 2 0  Down, Depressed, Hopeless 0 0 0 0 0  PHQ - 2 Score 0 0 0 2 0  Altered sleeping 0 _0 -  Tired, decreased energy 0 _1 -  Change in appetite 0 0 0 0 -  Feeling bad or failure about yourself  0 0 0 0 -  Trouble concentrating 0 0 0 0 -  Moving slowly or fidgety/restless 0 0 0 0 -  Suicidal thoughts 0 0 0 0 -  PHQ-9 Score 0 _2 -  Difficult doing work/chores Not difficult at all Not difficult at all - Not difficult at all -     Fall Risk: Fall Risk  06/30/2018 06/13/2018 03/12/2018 10/22/2017 06/27/2017  Falls in the past year?  0 0 No No No  Number falls in past yr: 0 0 - - -  Injury with Fall? - 0 - - -     Functional Status Survey: Is the patient deaf or have difficulty hearing?: No Does the patient have difficulty seeing, even when wearing glasses/contacts?: Yes(glasses) Does the patient  have difficulty concentrating, remembering, or making decisions?: No Does the patient have difficulty walking or climbing stairs?: No Does the patient have difficulty dressing or bathing?: No Does the patient have difficulty doing errands alone such as visiting a doctor's office or shopping?: No   Assessment & Plan  1. Well woman exam   2. Need for hepatitis A and B vaccination  - Hepatitis A vaccine adult IM - Hepatitis B vaccine adult IM  3. Cervical high risk HPV (human papillomavirus) test positive  Last one was norma, but we will recheck today   4. Cervical cancer screening  Pap smear   -USPSTF grade A and B recommendations reviewed with patient; age-appropriate recommendations, preventive care, screening tests, etc discussed and encouraged; healthy living encouraged; see AVS for patient education given to patient -Discussed importance of 150 minutes of physical activity weekly, eat two servings of fish weekly, eat one serving of tree nuts ( cashews, pistachios, pecans, almonds.Marland Kitchen) every other day, eat 6 servings of fruit/vegetables daily and drink plenty of water and avoid sweet beverages.

## 2018-07-01 LAB — CYTOLOGY - PAP: Diagnosis: NEGATIVE

## 2018-07-04 ENCOUNTER — Ambulatory Visit
Admission: RE | Admit: 2018-07-04 | Discharge: 2018-07-04 | Disposition: A | Discharge status: Home / Self Care | Payer: 59 | Source: Ambulatory Visit | Attending: Family Medicine | Admitting: Family Medicine

## 2018-07-04 DIAGNOSIS — Z1231 Encounter for screening mammogram for malignant neoplasm of breast: Secondary | ICD-10-CM | POA: Diagnosis not present

## 2018-07-04 DIAGNOSIS — Z853 Personal history of malignant neoplasm of breast: Secondary | ICD-10-CM | POA: Diagnosis not present

## 2018-09-17 ENCOUNTER — Ambulatory Visit: Payer: 59 | Admitting: Family Medicine

## 2018-09-17 ENCOUNTER — Encounter: Payer: Self-pay | Admitting: Family Medicine

## 2018-09-17 VITALS — BP 130/80 | HR 90 | Temp 97.9°F | Resp 16 | Ht 63.0 in | Wt 202.2 lb

## 2018-09-17 DIAGNOSIS — M62838 Other muscle spasm: Secondary | ICD-10-CM | POA: Diagnosis not present

## 2018-09-17 DIAGNOSIS — G63 Polyneuropathy in diseases classified elsewhere: Secondary | ICD-10-CM | POA: Diagnosis not present

## 2018-09-17 DIAGNOSIS — J449 Chronic obstructive pulmonary disease, unspecified: Secondary | ICD-10-CM | POA: Diagnosis not present

## 2018-09-17 DIAGNOSIS — E782 Mixed hyperlipidemia: Secondary | ICD-10-CM

## 2018-09-17 DIAGNOSIS — IMO0002 Reserved for concepts with insufficient information to code with codable children: Secondary | ICD-10-CM

## 2018-09-17 DIAGNOSIS — M810 Age-related osteoporosis without current pathological fracture: Secondary | ICD-10-CM | POA: Diagnosis not present

## 2018-09-17 DIAGNOSIS — Z780 Asymptomatic menopausal state: Secondary | ICD-10-CM

## 2018-09-17 DIAGNOSIS — I10 Essential (primary) hypertension: Secondary | ICD-10-CM

## 2018-09-17 DIAGNOSIS — E8881 Metabolic syndrome: Secondary | ICD-10-CM

## 2018-09-17 DIAGNOSIS — Z853 Personal history of malignant neoplasm of breast: Secondary | ICD-10-CM

## 2018-09-17 DIAGNOSIS — G4700 Insomnia, unspecified: Secondary | ICD-10-CM

## 2018-09-17 DIAGNOSIS — Z720 Tobacco use: Secondary | ICD-10-CM

## 2018-09-17 DIAGNOSIS — E538 Deficiency of other specified B group vitamins: Secondary | ICD-10-CM

## 2018-09-17 MED ORDER — ALBUTEROL SULFATE (2.5 MG/3ML) 0.083% IN NEBU
2.5000 mg | INHALATION_SOLUTION | Freq: Once | RESPIRATORY_TRACT | Status: AC
  Administered 2018-09-17: 2.5 mg via RESPIRATORY_TRACT

## 2018-09-17 MED ORDER — CYCLOBENZAPRINE HCL 10 MG PO TABS: 5.0000 mg | ORAL_TABLET | Freq: Every day | ORAL | 0 refills | Status: DC

## 2018-09-17 MED ORDER — UMECLIDINIUM-VILANTEROL 62.5-25 MCG/INH IN AEPB: 1.0000 | INHALATION_SPRAY | Freq: Every day | RESPIRATORY_TRACT | 5 refills | Status: DC

## 2018-09-17 MED ORDER — ZOLPIDEM TARTRATE 10 MG PO TABS: 10.0000 mg | ORAL_TABLET | Freq: Every day | ORAL | 0 refills | Status: DC

## 2018-09-17 NOTE — Progress Notes (Signed)
Name: Brenda Jones   MRN: [id number]    DOB: 04-Sep-1965   Date:09/17/2018       Progress Note  Subjective  Chief Complaint  Chief Complaint  Patient presents with  . Hypertension  . COPD  . Insomnia  . Hyperlipidemia    HPI  HTN: taking bp medication daily, now on losartan hctz, we decreased dose of diuretic when we added losartan but did not improve cramping, she continues to have  cramping since chemo back in November 02, 2011. Marland KitchenNo chest pain, shortness of breath;edema anklesis stable. She had cough with ACE but not with ARB. 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 Used to see Dr. Grayland Ormond, currently only calcium plus D, she had repeat bone density inJune 2016/11/01 it showed mild improvement, continue supplements.  Insomnia: medication helps her fall and stay asleep, but wakes up at 2 am to go to work. She has been getting about 5-6 hours of sleep now, becauseshe has been working 12 hours shifts. Discussed FDA and the need to try going down to 5 mg qhs but she states it took her too long to fall asleep with lower dose.She denies amnesia, weird dreams  or sleep walking with medication. Taking Ambien right before bed, denies side effects   COPD:She quit smoking for about one month but resumed after father died 2017/11/01. She is smoking 3/4pack a day since she was 53 yo, discussed importance of trying to quit again, pneumonia vaccines up to date, she refused flu vaccine and had flu like symptoms this past weekend, she still has a mild cough and rhinorrhea, she noticed some wheezing also, no SOB. She has not been using her inhaler .  Muscles Spasms: got much worse with cancer therapy - in 2011-11-02 and takes Cyclobenzaprine to control symptoms. It can be on her trunk or legs, she has tried magnesium and co Q 10 without improvement of symptoms, so she stopped all supplements. She is aware to not take Ambien, Flexeril and gabapentin at the same time  History of breast  cancer left:missed follow up withDr. Grayland Ormond this year,s/p lumpectomyshe completed 5 years of Anastrozole 03/2017., mammogramdone 06/2018 We will make a referral today, since she did not schedule follow up yet. Mammogram   Peripheral Neuropathy: still has daily pain on both legs and feet also also tips of fingers. She is on Gabapentin and has been able to take it during the day;but only takes it once a day. She has not noticed much of an improvement and discussed switching to Lyrica, explained it is generic now, but she does not want to change it, discussed going up on the dose and she also does not want to change it at this time. , she does not want to see neurologist.She states pain is stable 6-7/10 all the time.  Obesity:she has co-morbidities- OA, HTN , Metabolic syndrome. Discussed again  importance of life style modification. Her weight is stable since last visit. She is down to 20-40 ounces of sodas daily. She is not exercising yet.   Patient Active Problem List   Diagnosis Date Noted  . Morbid obesity ([place]) 03/12/2018  . Osteoarthritis of right knee 08/29/2016  . Osteopenia due to cancer therapy 04/18/2015  . GERD (gastroesophageal reflux disease) 04/18/2015  . Muscle spasm 04/18/2015  . Benign neoplasm of descending colon   . Cervical high risk HPV (human papillomavirus) test positive 03/22/2015  . Benign essential HTN 01/08/2015  . Insomnia, persistent 01/08/2015  . COPD,  mild ([place]) 01/08/2015  . HLD (hyperlipidemia) 01/08/2015  . Dysmetabolic syndrome 30/16/0109  . Severe obesity (BMI 35.0-35.9 with comorbidity) ([place]) 01/08/2015  . Seasonal allergic rhinitis 01/08/2015  . Secondary peripheral neuropathy ([place]) 01/08/2015  . Arthralgia of multiple joints 01/08/2015    Past Surgical History:  Procedure Laterality Date  . BLADDER SURGERY  2007  . BREAST BIOPSY Left 2013   invasive mammary carcinoma  . BREAST LUMPECTOMY Left 2013   invasive mammary carcinoma  with 2 positive lymph nodes. Margins were clear.   Marland Kitchen BREAST SURGERY    . COLONOSCOPY WITH PROPOFOL N/A 04/04/2015   Procedure: COLONOSCOPY WITH PROPOFOL;  Surgeon: [name], MD;  Location: [facility];  Service: Endoscopy;  Laterality: N/A;  . POLYPECTOMY  04/04/2015   Procedure: POLYPECTOMY;  Surgeon: [name], MD;  Location: [facility];  Service: Endoscopy;;    Family History  Problem Relation Age of Onset  . Heart disease Mother   . Diabetes Father   . Hypertension Father     Social History   Socioeconomic History  . Marital status: Single    Spouse name: Not on file  . Number of children: 1  . Years of education: Not on file  . Highest education level: 12th grade  Occupational History  . Occupation: Radio producer.   Social Needs  . Financial resource strain: Not very hard  . Food insecurity:    Worry: Never true    Inability: Never true  . Transportation needs:    Medical: No    Non-medical: No  Tobacco Use  . Smoking status: Current Every Day Smoker    Packs/day: 0.50    Years: 36.00    Pack years: 18.00    Types: Cigarettes    Start date: 06/27/1981  . Smokeless tobacco: Never Used  . Tobacco comment: she will try going down to 5 daily   Substance and Sexual Activity  . Alcohol use: No    Alcohol/week: 0.0 standard drinks  . Drug use: No  . Sexual activity: Not Currently    Partners: Male  Lifestyle  . Physical activity:    Days per week: 0 days    Minutes per session: 0 min  . Stress: Not at all  Relationships  . Social connections:    Talks on phone: More than three times a week    Gets together: Once a week    Attends religious service: More than 4 times per year    Active member of club or organization: No    Attends meetings of clubs or organizations: Never    Relationship status: Never married  . Intimate partner violence:    Fear of current or ex partner: No    Emotionally abused: No    Physically abused: No     Forced sexual activity: No  Other Topics Concern  . Not on file  Social History Narrative   She has one son, still lives at home, he helps her out financially also.      Current Outpatient Medications:  .  Cholecalciferol (VITAMIN D) 2000 units CAPS, Take 1 capsule (2,000 Units total) by mouth daily., Disp: 30 capsule, Rfl: 0 .  cyclobenzaprine (FLEXERIL) 10 MG tablet, Take 0.5-1 tablets (5-10 mg total) by mouth at bedtime., Disp: 90 tablet, Rfl: 0 .  fluticasone (FLONASE) 50 MCG/ACT nasal spray, Place 2 sprays into the nose as needed., Disp: , Rfl:  .  gabapentin (NEURONTIN) 300 MG capsule, Take 1 capsule (300 mg  total) by mouth daily., Disp: 90 capsule, Rfl: 1 .  loratadine (CLARITIN) 10 MG tablet, Take 1 tablet (10 mg total) by mouth as needed for allergies., Disp: 30 tablet, Rfl: 2 .  losartan-hydrochlorothiazide (HYZAAR) 50-12.5 MG tablet, Take 1 tablet by mouth daily., Disp: 90 tablet, Rfl: 3 .  Vitamin D, Ergocalciferol, (DRISDOL) 1.25 MG (50000 UT) CAPS capsule, Take 1 capsule (50,000 Units total) by mouth every 7 (seven) days., Disp: 12 capsule, Rfl: 0 .  zolpidem (AMBIEN) 10 MG tablet, Take 1 tablet (10 mg total) by mouth at bedtime., Disp: 90 tablet, Rfl: 0 .  umeclidinium-vilanterol (ANORO ELLIPTA) 62.5-25 MCG/INH AEPB, Inhale 1 puff into the lungs daily., Disp: 60 each, Rfl: 5  Allergies  Allergen Reactions  . Ace Inhibitors Cough    I personally reviewed active problem list, medication list, allergies, family history, social history with the patient/caregiver today.   ROS  Constitutional: Negative for fever or weight change.  Respiratory: Negative for cough and shortness of breath.   Cardiovascular: Negative for chest pain or palpitations.  Gastrointestinal: Negative for abdominal pain, no bowel changes.  Musculoskeletal: Negative for gait problem or joint swelling.  Skin: Negative for rash.  Neurological: Negative for dizziness or headache.  No other specific  complaints in a complete review of systems (except as listed in HPI above).  Objective  Vitals:   09/17/18 0834  BP: 130/80  Pulse: 90  Resp: 16  Temp: 97.9 F (36.6 C)  TempSrc: Oral  SpO2: 98%  Weight: 202 lb 3.2 oz (91.7 kg)  Height: 5\' 3"  (1.6 m)    Body mass index is 35.82 kg/m.  Physical Exam  Constitutional: Patient appears well-developed and well-nourished. Obese No distress.  HEENT: head atraumatic, normocephalic, pupils equal and reactive to light, ears normal TM, neck supple, throat within normal limits Cardiovascular: Normal rate, regular rhythm and normal heart sounds.  No murmur heard. No BLE edema. Pulmonary/Chest: Effort normal and breath sounds normal after albuterol  No respiratory distress.  Abdominal: Soft.  There is no tenderness. Psychiatric: Patient has a normal mood and affect. behavior is normal. Judgment and thought content normal.  Recent Results (from the past 2160 hour(s))  Cytology - PAP     Status: None   Collection Time: 06/30/18 12:00 AM  Result Value Ref Range   Adequacy      Satisfactory for evaluation  endocervical/transformation zone component PRESENT.   Diagnosis      NEGATIVE FOR INTRAEPITHELIAL LESIONS OR MALIGNANCY.   Material Submitted CervicoVaginal Pap [ThinPrep Imaged]       PHQ2/9: Depression screen [facility] 2/9 09/17/2018 06/13/2018 03/12/2018 10/22/2017 06/27/2017  Decreased Interest 0 0 0 0 2  Down, Depressed, Hopeless 0 0 0 0 0  PHQ - 2 Score 0 0 0 0 2  Altered sleeping 3 0 3 3 3   Tired, decreased energy 0 0 1 2 2   Change in appetite 0 0 0 0 0  Feeling bad or failure about yourself  0 0 0 0 0  Trouble concentrating 0 0 0 0 0  Moving slowly or fidgety/restless 0 0 0 0 0  Suicidal thoughts 0 0 0 0 0  PHQ-9 Score 3 0 4 5 7   Difficult doing work/chores Somewhat difficult Not difficult at all Not difficult at all - Not difficult at all   Reviewed phq 9 and she is not depressed   Fall Risk: Fall Risk  09/17/2018 09/17/2018  06/30/2018 06/13/2018 03/12/2018  Falls in the past year? 0  0 0 0 No  Number falls in past yr: 0 0 0 0 -  Injury with Fall? 0 0 - 0 -     Assessment & Plan  1. COPD Mild  (HCC)  - albuterol (PROVENTIL) (2.5 MG/3ML) 0.083% nebulizer solution 2.5 mg - umeclidinium-vilanterol (ANORO ELLIPTA) 62.5-25 MCG/INH AEPB; Inhale 1 puff into the lungs daily.  Dispense: 60 each; Refill: 5 FEV1/FVC was 72 prior to albuterol and improved to 104 with a 45 % improvement , she is willing to try Anoro.   2. Muscle spasm  - cyclobenzaprine (FLEXERIL) 10 MG tablet; Take 0.5-1 tablets (5-10 mg total) by mouth at bedtime.  Dispense: 90 tablet; Refill: 0  3. Secondary peripheral neuropathy (HCC)  Continue gabapentin   4. Osteopenia after menopause  Continue supplementation   5. Mixed hyperlipidemia  HDL dropped, discussed fish, tree nuts and exercise   6. Tobacco abuse  Discussed importance of quitting smoking   7. Dysmetabolic syndrome  Discussed life style modification   8. Low vitamin B12 level  Last level improved, taking otc supplements   9. Morbid obesity ([place])  BMI above 35 with co-morbidities HTN, dyslipidemia, OA  10. History of breast cancer in female  - Ambulatory referral to Hematology  11. Benign essential HTN  At goal   12. Insomnia, persistent  Continue Ambien

## 2018-12-22 ENCOUNTER — Ambulatory Visit (INDEPENDENT_AMBULATORY_CARE_PROVIDER_SITE_OTHER): Payer: 59 | Admitting: Family Medicine

## 2018-12-22 ENCOUNTER — Encounter: Payer: Self-pay | Admitting: Family Medicine

## 2018-12-22 DIAGNOSIS — G63 Polyneuropathy in diseases classified elsewhere: Secondary | ICD-10-CM

## 2018-12-22 DIAGNOSIS — J449 Chronic obstructive pulmonary disease, unspecified: Secondary | ICD-10-CM | POA: Diagnosis not present

## 2018-12-22 DIAGNOSIS — Z853 Personal history of malignant neoplasm of breast: Secondary | ICD-10-CM

## 2018-12-22 DIAGNOSIS — IMO0002 Reserved for concepts with insufficient information to code with codable children: Secondary | ICD-10-CM

## 2018-12-22 DIAGNOSIS — Z720 Tobacco use: Secondary | ICD-10-CM | POA: Diagnosis not present

## 2018-12-22 DIAGNOSIS — M62838 Other muscle spasm: Secondary | ICD-10-CM

## 2018-12-22 DIAGNOSIS — G4700 Insomnia, unspecified: Secondary | ICD-10-CM

## 2018-12-22 DIAGNOSIS — I10 Essential (primary) hypertension: Secondary | ICD-10-CM

## 2018-12-22 MED ORDER — GABAPENTIN 300 MG PO CAPS: 300.0000 mg | ORAL_CAPSULE | Freq: Every day | ORAL | 1 refills | Status: DC

## 2018-12-22 MED ORDER — ZOLPIDEM TARTRATE 10 MG PO TABS: 10.0000 mg | ORAL_TABLET | Freq: Every day | ORAL | 0 refills | Status: DC

## 2018-12-22 NOTE — Progress Notes (Signed)
Name: Brenda Jones   MRN: [id number]    DOB: April 24, 1966   Date:12/22/2018       Progress Note  Subjective  Chief Complaint  Chief Complaint  Patient presents with  . COPD  . Hypertension    I connected with  Brenda Jones  on 12/22/18 at  8:00 AM EDT by a video enabled telemedicine application and verified that I am speaking with the correct person using two identifiers.  I discussed the limitations of evaluation and management by telemedicine and the availability of in person appointments. The patient expressed understanding and agreed to proceed. Staff also discussed with the patient that there may be a patient responsible charge related to this service. Patient Location: at work Provider Location: [facility]  HPI  HTN: taking bp medication daily,now on losartan hctz, we decreased dose of diuretic when we added losartan but did not improve cramping,shecontinues to havecramping since chemo back in 10/21/11. Marland KitchenNo chest pain, shortness of breath;edema anklesis chronic. She had cough with ACE but not with ARB. BP not checked today, virtual visit.   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 Used to see Dr. Grayland Ormond, currently only calcium plus D, she had repeat bone density inJune Oct 20, 2016 it showed mild improvement, continue supplementation  Insomnia: medication helps her fall and stay asleep, but wakes up at 2 am to go to work. She has been getting about 5-6 hours of sleep now, becauseshe has been working 12 hours shifts. She tried going down on Ambien dose to 5 mg but could not sleep, she is aware of FDA recommendations. .She denies amnesia, weird dreams  or sleep walking with medication.   COPD:She quit smoking for about one month but resumed after father died October 20, 2017. She is smoking 3/4pack a day since she was 53 yo, discussed importance of trying to quit again, pneumonia vaccines up to date,she still has a mild cough that is usually dry, intermittent   wheezing and SOB. She uses inhalers a few times a week, only prn   Muscles Spasms: got much worse with cancer therapy - in 2011/10/21 and takes Cyclobenzaprine to control symptoms. It can be on her trunk or legs, she has tried magnesium and co Q 10 without improvement of symptoms, so she stopped all supplements. She is aware to not take Ambien, Flexeril and gabapentin at the same time. Unchanged   History of breast cancer left:missed follow up withDr. Grayland Ormond this year,s/p lumpectomyshe completed 5 years of Anastrozole 03/2017., mammogramdone 06/2018 , she has not seen Dr. Grayland Ormond yet  Peripheral Neuropathy: still has daily pain on both legs and feet also also tips of fingers. She is on Gabapentin and has been able to take it during the day;but only takes it once a day. She has not noticed much of an improvement and discussed switching to Lyrica, explained it is generic now, but she does not want to change it, discussed going up on the dose and she also does not want to change it at this time. , she does not want to see neurologist.She states pain is stable 7/10 all the time.  Obesity:she has co-morbidities- OA, HTN, Metabolic syndrome. Discussed again  importance of life style modification. Her weight is stable since last visit. Sheis down to 20 ounces of sodas daily. She is still not  exercising   Patient Active Problem List   Diagnosis Date Noted  . Morbid obesity ([place]) 03/12/2018  . Osteoarthritis of right knee 08/29/2016  . Osteopenia due to cancer  therapy 04/18/2015  . GERD (gastroesophageal reflux disease) 04/18/2015  . Muscle spasm 04/18/2015  . Benign neoplasm of descending colon   . Cervical high risk HPV (human papillomavirus) test positive 03/22/2015  . Benign essential HTN 01/08/2015  . Insomnia, persistent 01/08/2015  . COPD, mild ([place]) 01/08/2015  . HLD (hyperlipidemia) 01/08/2015  . Dysmetabolic syndrome 02/72/5366  . Severe obesity (BMI 35.0-35.9 with  comorbidity) ([place]) 01/08/2015  . Seasonal allergic rhinitis 01/08/2015  . Secondary peripheral neuropathy ([place]) 01/08/2015  . Arthralgia of multiple joints 01/08/2015    Past Surgical History:  Procedure Laterality Date  . BLADDER SURGERY  2007  . BREAST BIOPSY Left 2013   invasive mammary carcinoma  . BREAST LUMPECTOMY Left 2013   invasive mammary carcinoma with 2 positive lymph nodes. Margins were clear.   Marland Kitchen BREAST SURGERY    . COLONOSCOPY WITH PROPOFOL N/A 04/04/2015   Procedure: COLONOSCOPY WITH PROPOFOL;  Surgeon: [name], MD;  Location: [facility] [place];  Service: Endoscopy;  Laterality: N/A;  . POLYPECTOMY  04/04/2015   Procedure: POLYPECTOMY;  Surgeon: [name], MD;  Location: [facility];  Service: Endoscopy;;    Family History  Problem Relation Age of Onset  . Heart disease Mother   . Diabetes Father   . Hypertension Father     Social History   Socioeconomic History  . Marital status: Single    Spouse name: Not on file  . Number of children: 1  . Years of education: Not on file  . Highest education level: 12th grade  Occupational History  . Occupation: Radio producer.   Social Needs  . Financial resource strain: Not very hard  . Food insecurity:    Worry: Never true    Inability: Never true  . Transportation needs:    Medical: No    Non-medical: No  Tobacco Use  . Smoking status: Current Every Day Smoker    Packs/day: 0.75    Years: 36.00    Pack years: 27.00    Types: Cigarettes    Start date: 06/27/1981  . Smokeless tobacco: Never Used  Substance and Sexual Activity  . Alcohol use: No    Alcohol/week: 0.0 standard drinks  . Drug use: No  . Sexual activity: Not Currently    Partners: Male  Lifestyle  . Physical activity:    Days per week: 0 days    Minutes per session: 0 min  . Stress: Not at all  Relationships  . Social connections:    Talks on phone: More than three times a week    Gets together: Once a week     Attends religious service: More than 4 times per year    Active member of club or organization: No    Attends meetings of clubs or organizations: Never    Relationship status: Never married  . Intimate partner violence:    Fear of current or ex partner: No    Emotionally abused: No    Physically abused: No    Forced sexual activity: No  Other Topics Concern  . Not on file  Social History Narrative   She has one son, still lives at home, he helps her out financially also.      Current Outpatient Medications:  .  Cholecalciferol (VITAMIN D) 2000 units CAPS, Take 1 capsule (2,000 Units total) by mouth daily., Disp: 30 capsule, Rfl: 0 .  cyclobenzaprine (FLEXERIL) 10 MG tablet, Take 0.5-1 tablets (5-10 mg total) by mouth at bedtime., Disp: 90 tablet,  Rfl: 0 .  fluticasone (FLONASE) 50 MCG/ACT nasal spray, Place 2 sprays into the nose as needed., Disp: , Rfl:  .  gabapentin (NEURONTIN) 300 MG capsule, Take 1 capsule (300 mg total) by mouth daily., Disp: 90 capsule, Rfl: 1 .  loratadine (CLARITIN) 10 MG tablet, Take 1 tablet (10 mg total) by mouth as needed for allergies., Disp: 30 tablet, Rfl: 2 .  losartan-hydrochlorothiazide (HYZAAR) 50-12.5 MG tablet, Take 1 tablet by mouth daily., Disp: 90 tablet, Rfl: 3 .  umeclidinium-vilanterol (ANORO ELLIPTA) 62.5-25 MCG/INH AEPB, Inhale 1 puff into the lungs daily., Disp: 60 each, Rfl: 5 .  zolpidem (AMBIEN) 10 MG tablet, Take 1 tablet (10 mg total) by mouth at bedtime., Disp: 90 tablet, Rfl: 0 .  Vitamin D, Ergocalciferol, (DRISDOL) 1.25 MG (50000 UT) CAPS capsule, Take 1 capsule (50,000 Units total) by mouth every 7 (seven) days. (Patient not taking: Reported on 12/22/2018), Disp: 12 capsule, Rfl: 0  Allergies  Allergen Reactions  . Ace Inhibitors Cough    I personally reviewed active problem list, medication list, allergies, family history, social history with the patient/caregiver today.   ROS  Ten systems reviewed and is negative except as  mentioned in HPI   Objective  Virtual encounter, vitals not obtained.  There is no height or weight on file to calculate BMI.  Physical Exam  Awake, alert and oriented , no distress   PHQ2/9: Depression screen [facility] 2/9 12/22/2018 09/17/2018 06/13/2018 03/12/2018 10/22/2017  Decreased Interest 0 0 0 0 0  Down, Depressed, Hopeless 0 0 0 0 0  PHQ - 2 Score 0 0 0 0 0  Altered sleeping 0 3 0 3 3  Tired, decreased energy 0 0 0 1 2  Change in appetite 0 0 0 0 0  Feeling bad or failure about yourself  0 0 0 0 0  Trouble concentrating 0 0 0 0 0  Moving slowly or fidgety/restless 0 0 0 0 0  Suicidal thoughts 0 0 0 0 0  PHQ-9 Score 0 3 0 4 5  Difficult doing work/chores - Somewhat difficult Not difficult at all Not difficult at all -   PHQ-2/9 Result is negative.    Fall Risk: Fall Risk  12/22/2018 09/17/2018 09/17/2018 06/30/2018 06/13/2018  Falls in the past year? 0 0 0 0 0  Number falls in past yr: 0 0 0 0 0  Injury with Fall? 0 0 0 - 0     Assessment & Plan  1. Secondary peripheral neuropathy (HCC)  - gabapentin (NEURONTIN) 300 MG capsule; Take 1 capsule (300 mg total) by mouth daily.  Dispense: 90 capsule; Refill: 1  2. Insomnia, persistent  - zolpidem (AMBIEN) 10 MG tablet; Take 1 tablet (10 mg total) by mouth at bedtime.  Dispense: 90 tablet; Refill: 0  3. COPD, mild ([place])  Discussed importance of quitting smoking   4. Tobacco abuse   5. Muscle spasm  Stable  6. Morbid obesity ([place])  Discussed with the patient the risk posed by an increased BMI. Discussed importance of portion control, calorie counting and at least 150 minutes of physical activity weekly. Avoid sweet beverages and drink more water. Eat at least 6 servings of fruit and vegetables daily   7. History of breast cancer in female  Needs to follow up with Dr. Grayland Ormond   8. Benign essential HTN  Come in for in person visit next time   I discussed the assessment and treatment plan with the patient. The  patient  was provided an opportunity to ask questions and all were answered. The patient agreed with the plan and demonstrated an understanding of the instructions.  The patient was advised to call back or seek an in-person evaluation if the symptoms worsen or if the condition fails to improve as anticipated.  I provided 25  minutes of non-face-to-face time during this encounter.

## 2019-01-06 ENCOUNTER — Ambulatory Visit: Payer: 59 | Admitting: Family Medicine

## 2019-01-06 ENCOUNTER — Other Ambulatory Visit: Payer: Self-pay

## 2019-01-06 ENCOUNTER — Encounter: Payer: Self-pay | Admitting: Family Medicine

## 2019-01-06 VITALS — BP 130/80 | HR 90 | Temp 98.0°F | Resp 16 | Ht 63.0 in | Wt 202.5 lb

## 2019-01-06 DIAGNOSIS — R110 Nausea: Secondary | ICD-10-CM | POA: Diagnosis not present

## 2019-01-06 DIAGNOSIS — R109 Unspecified abdominal pain: Secondary | ICD-10-CM | POA: Diagnosis not present

## 2019-01-06 DIAGNOSIS — R1011 Right upper quadrant pain: Secondary | ICD-10-CM

## 2019-01-06 LAB — POCT URINALYSIS DIPSTICK
Appearance: ABNORMAL
Bilirubin, UA: NEGATIVE
Blood, UA: POSITIVE
Glucose, UA: NEGATIVE
Ketones, UA: NEGATIVE
Leukocytes, UA: NEGATIVE
Nitrite, UA: NEGATIVE
Odor: NORMAL
Protein, UA: NEGATIVE
Spec Grav, UA: 1.015 (ref 1.010–1.025)
Urobilinogen, UA: 0.2 E.U./dL
pH, UA: 7 (ref 5.0–8.0)

## 2019-01-06 NOTE — Progress Notes (Signed)
Name: Olympia Adelsberger   MRN: [id number]    DOB: 1965-08-22   Date:01/06/2019       Progress Note  Subjective  Chief Complaint  Chief Complaint  Patient presents with  . Flank Pain    She has had pain x 6 days. Pain started on Thursday, she got really sick and vomited. She continues to feel nauseous, but no emesis. She has dizziness when she is up and moving around.    HPI  Right flank pain: she states she developed acute onset of sharp pain on right flank radiating a little to mid axillary line. It happened 6 days ago, it was associated with nausea and vomiting. She states the pain started before breakfast, she ate a sandwich and vomited right after. Not associated with fever or chills, no change in bowel movements, she denies dysuria, urinary frequency or hematuria. No previous history of gallbladder problems.She states she is still nauseated on and off, and is also feeling dizzy / lightheaded when she gets up to walk and move.     Patient Active Problem List   Diagnosis Date Noted  . Morbid obesity ([place]) 03/12/2018  . Osteoarthritis of right knee 08/29/2016  . Osteopenia due to cancer therapy 04/18/2015  . GERD (gastroesophageal reflux disease) 04/18/2015  . Muscle spasm 04/18/2015  . Benign neoplasm of descending colon   . Cervical high risk HPV (human papillomavirus) test positive 03/22/2015  . Benign essential HTN 01/08/2015  . Insomnia, persistent 01/08/2015  . COPD, mild ([place]) 01/08/2015  . HLD (hyperlipidemia) 01/08/2015  . Dysmetabolic syndrome 56/38/7564  . Severe obesity (BMI 35.0-35.9 with comorbidity) ([place]) 01/08/2015  . Seasonal allergic rhinitis 01/08/2015  . Secondary peripheral neuropathy ([place]) 01/08/2015  . Arthralgia of multiple joints 01/08/2015    Past Surgical History:  Procedure Laterality Date  . BLADDER SURGERY  2007  . BREAST BIOPSY Left 2013   invasive mammary carcinoma  . BREAST LUMPECTOMY Left 2013   invasive mammary carcinoma with 2 positive  lymph nodes. Margins were clear.   Marland Kitchen BREAST SURGERY    . COLONOSCOPY WITH PROPOFOL N/A 04/04/2015   Procedure: COLONOSCOPY WITH PROPOFOL;  Surgeon: [name], MD;  Location: [facility];  Service: Endoscopy;  Laterality: N/A;  . POLYPECTOMY  04/04/2015   Procedure: POLYPECTOMY;  Surgeon: [name], MD;  Location: [place];  Service: Endoscopy;;    Family History  Problem Relation Age of Onset  . Heart disease Mother   . Diabetes Father   . Hypertension Father     Social History   Socioeconomic History  . Marital status: Single    Spouse name: Not on file  . Number of children: 1  . Years of education: Not on file  . Highest education level: 12th grade  Occupational History  . Occupation: Radio producer.   Social Needs  . Financial resource strain: Not very hard  . Food insecurity    Worry: Never true    Inability: Never true  . Transportation needs    Medical: No    Non-medical: No  Tobacco Use  . Smoking status: Current Every Day Smoker    Packs/day: 0.75    Years: 36.00    Pack years: 27.00    Types: Cigarettes    Start date: 06/27/1981  . Smokeless tobacco: Never Used  Substance and Sexual Activity  . Alcohol use: No    Alcohol/week: 0.0 standard drinks  . Drug use: No  . Sexual activity: Not Currently  Partners: Male  Lifestyle  . Physical activity    Days per week: 0 days    Minutes per session: 0 min  . Stress: Not at all  Relationships  . Social connections    Talks on phone: More than three times a week    Gets together: Once a week    Attends religious service: More than 4 times per year    Active member of club or organization: No    Attends meetings of clubs or organizations: Never    Relationship status: Never married  . Intimate partner violence    Fear of current or ex partner: No    Emotionally abused: No    Physically abused: No    Forced sexual activity: No  Other Topics Concern  . Not on file  Social  History Narrative   She has one son, still lives at home, he helps her out financially also.      Current Outpatient Medications:  .  Cholecalciferol (VITAMIN D) 2000 units CAPS, Take 1 capsule (2,000 Units total) by mouth daily., Disp: 30 capsule, Rfl: 0 .  cyclobenzaprine (FLEXERIL) 10 MG tablet, Take 0.5-1 tablets (5-10 mg total) by mouth at bedtime., Disp: 90 tablet, Rfl: 0 .  fluticasone (FLONASE) 50 MCG/ACT nasal spray, Place 2 sprays into the nose as needed., Disp: , Rfl:  .  gabapentin (NEURONTIN) 300 MG capsule, Take 1 capsule (300 mg total) by mouth daily., Disp: 90 capsule, Rfl: 1 .  loratadine (CLARITIN) 10 MG tablet, Take 1 tablet (10 mg total) by mouth as needed for allergies., Disp: 30 tablet, Rfl: 2 .  losartan-hydrochlorothiazide (HYZAAR) 50-12.5 MG tablet, Take 1 tablet by mouth daily., Disp: 90 tablet, Rfl: 3 .  umeclidinium-vilanterol (ANORO ELLIPTA) 62.5-25 MCG/INH AEPB, Inhale 1 puff into the lungs daily., Disp: 60 each, Rfl: 5 .  zolpidem (AMBIEN) 10 MG tablet, Take 1 tablet (10 mg total) by mouth at bedtime., Disp: 90 tablet, Rfl: 0 .  Vitamin D, Ergocalciferol, (DRISDOL) 1.25 MG (50000 UT) CAPS capsule, Take 1 capsule (50,000 Units total) by mouth every 7 (seven) days. (Patient not taking: Reported on 01/06/2019), Disp: 12 capsule, Rfl: 0  Allergies  Allergen Reactions  . Ace Inhibitors Cough    I personally reviewed active problem list, medication list, allergies, family history, social history with the patient/caregiver today.   ROS  Ten systems reviewed and is negative except as mentioned in HPI   Objective  Vitals:   01/06/19 0917  BP: 130/80  Pulse: 90  Resp: 16  Temp: 98 F (36.7 C)  TempSrc: Oral  SpO2: 98%  Weight: 202 lb 8 oz (91.9 kg)  Height: 5\' 3"  (1.6 m)    Body mass index is 35.87 kg/m.  Physical Exam  Constitutional: Patient appears well-developed and well-nourished. Obese  No distress.  HEENT: head atraumatic, normocephalic,  pupils equal and reactive to light,  neck supple, oral mucosa not done  Cardiovascular: Normal rate, regular rhythm and normal heart sounds.  No murmur heard. No BLE edema. Pulmonary/Chest: Effort normal and breath sounds normal. No respiratory distress. Abdominal: Soft.  There is RUQ  Tenderness, negative CVA tenderness. Psychiatric: Patient has a normal mood and affect. behavior is normal. Judgment and thought content normal.  Recent Results (from the past 2160 hour(s))  POCT Urinalysis Dipstick     Status: Abnormal   Collection Time: 01/06/19  9:13 AM  Result Value Ref Range   Color, UA orange    Clarity, UA cloudy  Glucose, UA Negative Negative   Bilirubin, UA Negative    Ketones, UA Negative    Spec Grav, UA 1.015 1.010 - 1.025   Blood, UA Positive     Comment: Small +   pH, UA 7.0 5.0 - 8.0   Protein, UA Negative Negative   Urobilinogen, UA 0.2 0.2 or 1.0 E.U./dL   Nitrite, UA Negative    Leukocytes, UA Negative Negative   Appearance Abnormal    Odor Normal      PHQ2/9: Depression screen [facility] 2/9 01/06/2019 12/22/2018 09/17/2018 06/13/2018 03/12/2018  Decreased Interest 0 0 0 0 0  Down, Depressed, Hopeless 0 0 0 0 0  PHQ - 2 Score 0 0 0 0 0  Altered sleeping 0 0 3 0 3  Tired, decreased energy 0 0 0 0 1  Change in appetite 0 0 0 0 0  Feeling bad or failure about yourself  0 0 0 0 0  Trouble concentrating 0 0 0 0 0  Moving slowly or fidgety/restless 0 0 0 0 0  Suicidal thoughts 0 0 0 0 0  PHQ-9 Score 0 0 3 0 4  Difficult doing work/chores - - Somewhat difficult Not difficult at all Not difficult at all    phq 9 is negative   Fall Risk: Fall Risk  12/22/2018 09/17/2018 09/17/2018 06/30/2018 06/13/2018  Falls in the past year? 0 0 0 0 0  Number falls in past yr: 0 0 0 0 0  Injury with Fall? 0 0 0 - 0    Assessment & Plan   1. Right flank pain  - POCT Urinalysis Dipstick  2. Right upper quadrant pain  - US Abdomen Limited RUQ; Future  3. Nausea  - US Abdomen  Limited RUQ; Future  -Red flags and when to present for emergency care or RTC including fever >101.4F, chest pain, shortness of breath, new/worsening/un-resolving symptoms,  reviewed with patient at time of visit. Follow up and care instructions discussed and provided in AVS.

## 2019-01-09 ENCOUNTER — Other Ambulatory Visit: Payer: Self-pay | Admitting: Family Medicine

## 2019-01-09 ENCOUNTER — Other Ambulatory Visit: Payer: Self-pay

## 2019-01-09 ENCOUNTER — Ambulatory Visit
Admission: RE | Admit: 2019-01-09 | Discharge: 2019-01-09 | Disposition: A | Discharge status: Home / Self Care | Payer: 59 | Source: Ambulatory Visit | Attending: Family Medicine | Admitting: Family Medicine

## 2019-01-09 DIAGNOSIS — R1011 Right upper quadrant pain: Secondary | ICD-10-CM

## 2019-01-09 DIAGNOSIS — R112 Nausea with vomiting, unspecified: Secondary | ICD-10-CM

## 2019-01-09 DIAGNOSIS — R110 Nausea: Secondary | ICD-10-CM | POA: Diagnosis present

## 2019-01-12 ENCOUNTER — Telehealth: Payer: Self-pay

## 2019-01-12 DIAGNOSIS — E8881 Metabolic syndrome: Secondary | ICD-10-CM

## 2019-01-12 DIAGNOSIS — R1010 Upper abdominal pain, unspecified: Secondary | ICD-10-CM

## 2019-01-12 DIAGNOSIS — R110 Nausea: Secondary | ICD-10-CM

## 2019-01-12 NOTE — Telephone Encounter (Signed)
Patient was informed of [place] results when I called her about the HIDA scan that is scheduled for 01/21/2019 @ 10am [facility]).  She states that she is still in a lot of pain and wanted to know if there is anything that she can do or take to ease it off.  I told her that I will send her PCP a message and then someone will give her a call back afterwards.

## 2019-01-13 ENCOUNTER — Other Ambulatory Visit: Payer: Self-pay | Admitting: Family Medicine

## 2019-01-13 DIAGNOSIS — R1010 Upper abdominal pain, unspecified: Secondary | ICD-10-CM

## 2019-01-13 DIAGNOSIS — E8881 Metabolic syndrome: Secondary | ICD-10-CM

## 2019-01-13 DIAGNOSIS — R110 Nausea: Secondary | ICD-10-CM

## 2019-01-13 MED ORDER — TRAMADOL HCL 50 MG PO TABS: 50.0000 mg | ORAL_TABLET | Freq: Three times a day (TID) | ORAL | 0 refills | Status: AC | PRN

## 2019-01-13 MED ORDER — VITAMIN D (ERGOCALCIFEROL) 1.25 MG (50000 UNIT) PO CAPS: 50000.0000 [IU] | ORAL_CAPSULE | ORAL | 0 refills | Status: DC

## 2019-01-13 MED ORDER — OMEPRAZOLE 40 MG PO CPDR: 40.0000 mg | DELAYED_RELEASE_CAPSULE | Freq: Every day | ORAL | 0 refills | Status: DC

## 2019-01-13 NOTE — Telephone Encounter (Signed)
No they have set times since it takes 2hrs for the procedure and that is the 1st available

## 2019-01-15 ENCOUNTER — Other Ambulatory Visit: Payer: Self-pay | Admitting: Family Medicine

## 2019-01-15 DIAGNOSIS — A048 Other specified bacterial intestinal infections: Secondary | ICD-10-CM

## 2019-01-15 LAB — COMPLETE METABOLIC PANEL WITH GFR
AG Ratio: 1.7 (calc) (ref 1.0–2.5)
ALT: 17 U/L (ref 6–29)
AST: 17 U/L (ref 10–35)
Albumin: 4 g/dL (ref 3.6–5.1)
Alkaline phosphatase (APISO): 75 U/L (ref 37–153)
BUN: 14 mg/dL (ref 7–25)
CO2: 23 mmol/L (ref 20–32)
Calcium: 9.1 mg/dL (ref 8.6–10.4)
Chloride: 107 mmol/L (ref 98–110)
Creat: 0.74 mg/dL (ref 0.50–1.05)
GFR, Est African American: 106 mL/min/{1.73_m2} (ref >=60)
GFR, Est Non African American: 92 mL/min/{1.73_m2} (ref >=60)
Globulin: 2.4 g/dL (calc) (ref 1.9–3.7)
Glucose, Bld: 97 mg/dL (ref 65–99)
Potassium: 4 mmol/L (ref 3.5–5.3)
Sodium: 139 mmol/L (ref 135–146)
Total Bilirubin: 0.3 mg/dL (ref 0.2–1.2)
Total Protein: 6.4 g/dL (ref 6.1–8.1)

## 2019-01-15 LAB — CBC WITH DIFFERENTIAL/PLATELET
Absolute Monocytes: 617 cells/uL (ref 200–950)
Basophils Absolute: 49 cells/uL (ref 0–200)
Basophils Relative: 0.5 %
Eosinophils Absolute: 118 cells/uL (ref 15–500)
Eosinophils Relative: 1.2 %
HCT: 38.8 % (ref 35.0–45.0)
Hemoglobin: 12.7 g/dL (ref 11.7–15.5)
Lymphs Abs: 3420 cells/uL (ref 850–3900)
MCH: 27.3 pg (ref 27.0–33.0)
MCHC: 32.7 g/dL (ref 32.0–36.0)
MCV: 83.3 fL (ref 80.0–100.0)
MPV: 10.9 fL (ref 7.5–12.5)
Monocytes Relative: 6.3 %
Neutro Abs: 5596 cells/uL (ref 1500–7800)
Neutrophils Relative %: 57.1 %
Platelets: 317 10*3/uL (ref 140–400)
RBC: 4.66 10*6/uL (ref 3.80–5.10)
RDW: 14.4 % (ref 11.0–15.0)
Total Lymphocyte: 34.9 %
WBC: 9.8 10*3/uL (ref 3.8–10.8)

## 2019-01-15 LAB — LIPASE: Lipase: 25 U/L (ref 7–60)

## 2019-01-15 LAB — HEMOGLOBIN A1C
Hgb A1c MFr Bld: 6 % of total Hgb — ABNORMAL HIGH (ref <5.7)
Mean Plasma Glucose: 126 (calc)
eAG (mmol/L): 7 (calc)

## 2019-01-15 LAB — SPECIMEN COMPROMISED

## 2019-01-15 LAB — H. PYLORI BREATH TEST: H. pylori Breath Test: DETECTED — AB

## 2019-01-15 MED ORDER — AMOXICILL-CLARITHRO-LANSOPRAZ PO MISC: Freq: Two times a day (BID) | ORAL | 0 refills | Status: DC

## 2019-01-15 MED ORDER — CLARITHROMYCIN 500 MG PO TABS: 500.0000 mg | ORAL_TABLET | Freq: Two times a day (BID) | ORAL | 0 refills | Status: DC

## 2019-01-15 MED ORDER — AMOXICILLIN 500 MG PO CAPS: 1000.0000 mg | ORAL_CAPSULE | Freq: Two times a day (BID) | ORAL | 0 refills | Status: DC

## 2019-01-15 MED ORDER — LANSOPRAZOLE 30 MG PO CPDR: 30.0000 mg | DELAYED_RELEASE_CAPSULE | Freq: Two times a day (BID) | ORAL | 0 refills | Status: DC

## 2019-02-06 ENCOUNTER — Other Ambulatory Visit: Payer: Self-pay | Admitting: Family Medicine

## 2019-02-06 DIAGNOSIS — R1010 Upper abdominal pain, unspecified: Secondary | ICD-10-CM

## 2019-02-06 DIAGNOSIS — R110 Nausea: Secondary | ICD-10-CM

## 2019-03-04 ENCOUNTER — Other Ambulatory Visit: Payer: Self-pay | Admitting: Family Medicine

## 2019-03-04 DIAGNOSIS — G4700 Insomnia, unspecified: Secondary | ICD-10-CM

## 2019-03-13 ENCOUNTER — Other Ambulatory Visit: Payer: Self-pay | Admitting: Family Medicine

## 2019-03-13 DIAGNOSIS — I10 Essential (primary) hypertension: Secondary | ICD-10-CM

## 2019-03-14 NOTE — Telephone Encounter (Signed)
Requested Prescriptions  Pending Prescriptions Disp Refills  . losartan-hydrochlorothiazide (HYZAAR) 50-12.5 MG tablet [Pharmacy Med Name: LOSARTAN-HCTZ 50-12.5 MG TAB] 90 tablet 3    Sig: TAKE 1 TABLET BY MOUTH EVERY DAY     Cardiovascular: ARB + Diuretic Combos Passed - 03/13/2019  7:14 PM      Passed - K in normal range and within 180 days    Potassium  Date Value Ref Range Status  01/14/2019 4.0 3.5 - 5.3 mmol/L Final  01/25/2012 3.6 3.5 - 5.1 mmol/L Final         Passed - Na in normal range and within 180 days    Sodium  Date Value Ref Range Status  01/14/2019 139 135 - 146 mmol/L Final  01/25/2012 139 136 - 145 mmol/L Final         Passed - Cr in normal range and within 180 days    Creat  Date Value Ref Range Status  01/14/2019 0.74 0.50 - 1.05 mg/dL Final    Comment:    For patients >11 years of age, the reference limit for Creatinine is approximately 13% higher for people identified as African-American. .          Passed - Ca in normal range and within 180 days    Calcium  Date Value Ref Range Status  01/14/2019 9.1 8.6 - 10.4 mg/dL Final   Calcium, Total  Date Value Ref Range Status  01/25/2012 8.7 8.5 - 10.1 mg/dL Final         Passed - Patient is not pregnant      Passed - Last BP in normal range    BP Readings from Last 1 Encounters:  01/06/19 130/80         Passed - Valid encounter within last 6 months    Recent Outpatient Visits          2 months ago Right flank pain   [facility] Steele Sizer, MD   2 months ago Secondary peripheral neuropathy [facility])   [facility] Steele Sizer, MD   5 months ago COPD, mild [facility])   [facility] Steele Sizer, MD   8 months ago Well woman exam   [facility] Steele Sizer, MD   9 months ago Osteopenia due to cancer therapy   [facility] Steele Sizer, MD      Future Appointments            In 1 week  Steele Sizer, MD [facility], [facility]

## 2019-03-24 ENCOUNTER — Ambulatory Visit: Payer: 59 | Admitting: Family Medicine

## 2019-03-24 ENCOUNTER — Encounter: Payer: Self-pay | Admitting: Family Medicine

## 2019-03-24 ENCOUNTER — Other Ambulatory Visit: Payer: Self-pay

## 2019-03-24 VITALS — BP 134/80 | HR 95 | Temp 97.3°F | Resp 16 | Ht 63.0 in | Wt 207.4 lb

## 2019-03-24 DIAGNOSIS — G4700 Insomnia, unspecified: Secondary | ICD-10-CM | POA: Diagnosis not present

## 2019-03-24 DIAGNOSIS — E8881 Metabolic syndrome: Secondary | ICD-10-CM | POA: Diagnosis not present

## 2019-03-24 DIAGNOSIS — E559 Vitamin D deficiency, unspecified: Secondary | ICD-10-CM

## 2019-03-24 DIAGNOSIS — J449 Chronic obstructive pulmonary disease, unspecified: Secondary | ICD-10-CM

## 2019-03-24 DIAGNOSIS — Z23 Encounter for immunization: Secondary | ICD-10-CM | POA: Diagnosis not present

## 2019-03-24 DIAGNOSIS — I10 Essential (primary) hypertension: Secondary | ICD-10-CM

## 2019-03-24 DIAGNOSIS — Z8619 Personal history of other infectious and parasitic diseases: Secondary | ICD-10-CM

## 2019-03-24 DIAGNOSIS — M62838 Other muscle spasm: Secondary | ICD-10-CM

## 2019-03-24 MED ORDER — ZOLPIDEM TARTRATE 10 MG PO TABS: 10.0000 mg | ORAL_TABLET | Freq: Every day | ORAL | 0 refills | Status: DC

## 2019-03-24 MED ORDER — CYCLOBENZAPRINE HCL 10 MG PO TABS: 5.0000 mg | ORAL_TABLET | Freq: Every day | ORAL | 0 refills | Status: DC

## 2019-03-24 MED ORDER — VITAMIN D (ERGOCALCIFEROL) 1.25 MG (50000 UNIT) PO CAPS: 50000.0000 [IU] | ORAL_CAPSULE | ORAL | 0 refills | Status: DC

## 2019-03-24 NOTE — Progress Notes (Signed)
Name: Brenda Jones   MRN: [id number]    DOB: 11/15/1965   Date:03/24/2019       Progress Note  Subjective  Chief Complaint  No chief complaint on file.   HPI  HTN: taking bp medication daily,now on losartan hctz, we decreased dose of diuretic when we added losartan but did not improve cramping,shecontinues to havecramping since chemo back in 11/14/11. Marland KitchenNo chest pain, shortness of breath;edema anklesis chronic. She had cough with ACE but not with ARB. BP is at goal, continue therapy   History of h. 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 Grayland Ormond, currently only calcium plus D, she had repeat bone density inJune 2016/11/13 it showed mild improvement, continue supplementation, recheck later this year  Insomnia: medication helps her fall and stay asleep, but wakes up at 2 am to go to work. She has been getting about 5-6 hours of sleep now, becauseshe has been working 12 hours shifts. She tried going down on Ambien dose to 5 mg but could not sleep, she is aware of FDA recommendations. No side effects   COPD:She quit smoking for about one month but resumed after father died November 13, 2017. She is smoking 3/4pack a day since she was 53 yo, discussed importance of trying to quit again, pneumonia vaccines up to date, she states cough has been less frequent, she is not sure when she will quit smoking but eventually she will quit again   Muscles Spasms: got much worse with cancer therapy - in 11/14/11 and takes Cyclobenzaprine to control symptoms. It can be on her trunk or legs, she has tried magnesium and co Q 10 without improvement of symptoms, so she stopped all supplements.She is aware to not take Ambien, Flexeril and gabapentin at the same time. She has been taking Flexeril usually after she gets home from work   History of breast cancer  left:missed follow up withDr. Grayland Ormond this year,s/p lumpectomyshe completed 5 years of Anastrozole 03/2017., mammogramdone 06/2018, she has is no longer going to see Dr. Grayland Ormond because of cost of co-pay she will have mammograms yearly   Peripheral Neuropathy: still has daily pain on both legs and feet also also tips of fingers. She is on Gabapentin and has been able to take it during the day;but only takes it once a day, she does not want to see neurologist.She states pain is stable 6-7/10 all the time.  Obesity:she has co-morbidities- OA, HTN,Metabolic syndrome. Discussed againimportance of life style modification. Her weight is stable since last visit.Sheis down to 20 ounces of sodas daily. She is still not  exercising   Patient Active Problem List   Diagnosis Date Noted  . Morbid obesity ([place]) 03/12/2018  . Osteoarthritis of right knee 08/29/2016  . Osteopenia due to cancer therapy 04/18/2015  . GERD (gastroesophageal reflux disease) 04/18/2015  . Muscle spasm 04/18/2015  . Benign neoplasm of descending colon   . Cervical high risk HPV (human papillomavirus) test positive 03/22/2015  . Benign essential HTN 01/08/2015  . Insomnia, persistent 01/08/2015  . COPD, mild ([place]) 01/08/2015  . HLD (hyperlipidemia) 01/08/2015  . Dysmetabolic syndrome [id number]  . Severe obesity (BMI 35.0-35.9 with comorbidity) ([place]) 01/08/2015  . Seasonal allergic rhinitis 01/08/2015  . Secondary peripheral neuropathy ([place]) 01/08/2015  . Arthralgia of multiple joints 01/08/2015    Past Surgical History:  Procedure Laterality Date  . BLADDER SURGERY  11-13-05  .  BREAST BIOPSY Left 2013   invasive mammary carcinoma  . BREAST LUMPECTOMY Left 2013   invasive mammary carcinoma with 2 positive lymph nodes. Margins were clear.   Marland Kitchen BREAST SURGERY    . COLONOSCOPY WITH PROPOFOL N/A 04/04/2015   Procedure: COLONOSCOPY WITH PROPOFOL;  Surgeon: [name], MD;  Location: [facility];   Service: Endoscopy;  Laterality: N/A;  . POLYPECTOMY  04/04/2015   Procedure: POLYPECTOMY;  Surgeon: [name], MD;  Location: [facility];  Service: Endoscopy;;    Family History  Problem Relation Age of Onset  . Heart disease Mother   . Diabetes Father   . Hypertension Father     Social History   Socioeconomic History  . Marital status: Single    Spouse name: Not on file  . Number of children: 1  . Years of education: Not on file  . Highest education level: 12th grade  Occupational History  . Occupation: Radio producer.   Social Needs  . Financial resource strain: Not very hard  . Food insecurity    Worry: Never true    Inability: Never true  . Transportation needs    Medical: No    Non-medical: No  Tobacco Use  . Smoking status: Current Every Day Smoker    Packs/day: 0.75    Years: 36.00    Pack years: 27.00    Types: Cigarettes    Start date: 06/27/1981  . Smokeless tobacco: Never Used  Substance and Sexual Activity  . Alcohol use: No    Alcohol/week: 0.0 standard drinks  . Drug use: No  . Sexual activity: Not Currently    Partners: Male  Lifestyle  . Physical activity    Days per week: 0 days    Minutes per session: 0 min  . Stress: Not at all  Relationships  . Social connections    Talks on phone: More than three times a week    Gets together: Once a week    Attends religious service: More than 4 times per year    Active member of club or organization: No    Attends meetings of clubs or organizations: Never    Relationship status: Never married  . Intimate partner violence    Fear of current or ex partner: No    Emotionally abused: No    Physically abused: No    Forced sexual activity: No  Other Topics Concern  . Not on file  Social History Narrative   She has one son, still lives at home, he helps her out financially also.      Current Outpatient Medications:  .  Cholecalciferol (VITAMIN D) 2000 units CAPS, Take 1 capsule (2,000  Units total) by mouth daily., Disp: 30 capsule, Rfl: 0 .  cyclobenzaprine (FLEXERIL) 10 MG tablet, Take 0.5-1 tablets (5-10 mg total) by mouth at bedtime., Disp: 90 tablet, Rfl: 0 .  fluticasone (FLONASE) 50 MCG/ACT nasal spray, Place 2 sprays into the nose as needed., Disp: , Rfl:  .  gabapentin (NEURONTIN) 300 MG capsule, Take 1 capsule (300 mg total) by mouth daily., Disp: 90 capsule, Rfl: 1 .  lansoprazole (PREVACID) 30 MG capsule, Take 1 capsule (30 mg total) by mouth 2 (two) times daily before a meal., Disp: 28 capsule, Rfl: 0 .  loratadine (CLARITIN) 10 MG tablet, Take 1 tablet (10 mg total) by mouth as needed for allergies., Disp: 30 tablet, Rfl: 2 .  losartan-hydrochlorothiazide (HYZAAR) 50-12.5 MG tablet, TAKE 1 TABLET BY MOUTH EVERY DAY,  Disp: 90 tablet, Rfl: 3 .  omeprazole (PRILOSEC) 40 MG capsule, TAKE 1 CAPSULE BY MOUTH EVERY DAY, Disp: 90 capsule, Rfl: 1 .  traMADol (ULTRAM) 50 MG tablet, , Disp: , Rfl:  .  umeclidinium-vilanterol (ANORO ELLIPTA) 62.5-25 MCG/INH AEPB, Inhale 1 puff into the lungs daily., Disp: 60 each, Rfl: 5 .  Vitamin D, Ergocalciferol, (DRISDOL) 1.25 MG (50000 UT) CAPS capsule, Take 1 capsule (50,000 Units total) by mouth every 7 (seven) days., Disp: 12 capsule, Rfl: 0 .  zolpidem (AMBIEN) 10 MG tablet, Take 1 tablet (10 mg total) by mouth at bedtime., Disp: 90 tablet, Rfl: 0  Allergies  Allergen Reactions  . Ace Inhibitors Cough    I personally reviewed active problem list, medication list, allergies, family history, social history with the patient/caregiver today.   ROS  Constitutional: Negative for fever , positive for mild  weight change.  Respiratory: Negative for cough and shortness of breath.   Cardiovascular: Negative for chest pain or palpitations.  Gastrointestinal: Negative for abdominal pain, no bowel changes.  Musculoskeletal: Negative for gait problem or joint swelling.  Skin: Negative for rash.  Neurological: Negative for dizziness or  headache.  No other specific complaints in a complete review of systems (except as listed in HPI above).  Objective  Vitals:   03/24/19 0825  BP: 134/80  Pulse: 95  Resp: 16  Temp: (!) 97.3 F (36.3 C)  TempSrc: Temporal  SpO2: 98%  Weight: 207 lb 6.4 oz (94.1 kg)  Height: 5\' 3"  (1.6 m)    Body mass index is 36.74 kg/m.  Physical Exam  Constitutional: Patient appears well-developed and well-nourished. Obese No distress.  HEENT: head atraumatic, normocephalic, pupils equal and reactive to light, oral exam not done Cardiovascular: Normal rate, regular rhythm and normal heart sounds.  No murmur heard. No BLE edema. Pulmonary/Chest: Effort normal and breath sounds normal. No respiratory distress. Abdominal: Soft.  There is no tenderness. Psychiatric: Patient has a normal mood and affect. behavior is normal. Judgment and thought content normal.  Recent Results (from the past 2160 hour(s))  POCT Urinalysis Dipstick     Status: Abnormal   Collection Time: 01/06/19  9:13 AM  Result Value Ref Range   Color, UA orange    Clarity, UA cloudy    Glucose, UA Negative Negative   Bilirubin, UA Negative    Ketones, UA Negative    Spec Grav, UA 1.015 1.010 - 1.025   Blood, UA Positive     Comment: Small +   pH, UA 7.0 5.0 - 8.0   Protein, UA Negative Negative   Urobilinogen, UA 0.2 0.2 or 1.0 E.U./dL   Nitrite, UA Negative    Leukocytes, UA Negative Negative   Appearance Abnormal    Odor Normal   Hemoglobin A1c     Status: Abnormal   Collection Time: 01/14/19  7:51 AM  Result Value Ref Range   Hgb A1c MFr Bld 6.0 (H) <5.7 % of total Hgb    Comment: For someone without known diabetes, a hemoglobin  A1c value between 5.7% and 6.4% is consistent with prediabetes and should be confirmed with a  follow-up test. . For someone with known diabetes, a value <7% indicates that their diabetes is well controlled. A1c targets should be individualized based on duration of diabetes, age,  comorbid conditions, and other considerations. . This assay result is consistent with an increased risk of diabetes. . Currently, no consensus exists regarding use of hemoglobin A1c for diagnosis of diabetes  for children. .    Mean Plasma Glucose 126 (calc)   eAG (mmol/L) 7.0 (calc)  H. pylori breath test     Status: Abnormal   Collection Time: 01/14/19  7:51 AM  Result Value Ref Range   H. pylori Breath Test DETECTED (A) NOT DETECT    Comment: . Antimicrobials, proton pump inhibitors, and bismuth preparations are known to suppress H. pylori, and  ingestion of these prior to H. pylori diagnostic testing may lead to false negative results. If clinically  indicated, the test may be repeated on a new specimen obtained two weeks after discontinuing treatment. However, a positive result is still clinically valid.   COMPLETE METABOLIC PANEL WITH GFR     Status: None   Collection Time: 01/14/19  7:51 AM  Result Value Ref Range   Glucose, Bld 97 65 - 99 mg/dL    Comment: .            Fasting reference interval .    BUN 14 7 - 25 mg/dL   Creat 0.74 0.50 - 1.05 mg/dL    Comment: For patients >14 years of age, the reference limit for Creatinine is approximately 13% higher for people identified as African-American. .    GFR, Est Non African American 92 > OR = 60 mL/min/1.29m2   GFR, Est African American 106 > OR = 60 mL/min/1.50m2   BUN/Creatinine Ratio NOT APPLICABLE 6 - 22 (calc)   Sodium 139 135 - 146 mmol/L   Potassium 4.0 3.5 - 5.3 mmol/L   Chloride 107 98 - 110 mmol/L   CO2 23 20 - 32 mmol/L   Calcium 9.1 8.6 - 10.4 mg/dL   Total Protein 6.4 6.1 - 8.1 g/dL   Albumin 4.0 3.6 - 5.1 g/dL   Globulin 2.4 1.9 - 3.7 g/dL (calc)   AG Ratio 1.7 1.0 - 2.5 (calc)   Total Bilirubin 0.3 0.2 - 1.2 mg/dL   Alkaline phosphatase (APISO) 75 37 - 153 U/L   AST 17 10 - 35 U/L   ALT 17 6 - 29 U/L  CBC with Differential/Platelet     Status: None   Collection Time: 01/14/19  7:51 AM   Result Value Ref Range   WBC 9.8 3.8 - 10.8 Thousand/uL   RBC 4.66 3.80 - 5.10 Million/uL   Hemoglobin 12.7 11.7 - 15.5 g/dL   HCT 38.8 35.0 - 45.0 %   MCV 83.3 80.0 - 100.0 fL   MCH 27.3 27.0 - 33.0 pg   MCHC 32.7 32.0 - 36.0 g/dL   RDW 14.4 11.0 - 15.0 %   Platelets 317 140 - 400 Thousand/uL   MPV 10.9 7.5 - 12.5 fL   Neutro Abs 5,596 1,500 - 7,800 cells/uL   Lymphs Abs 3,420 850 - 3,900 cells/uL   Absolute Monocytes 617 200 - 950 cells/uL   Eosinophils Absolute 118 15 - 500 cells/uL   Basophils Absolute 49 0 - 200 cells/uL   Neutrophils Relative % 57.1 %   Total Lymphocyte 34.9 %   Monocytes Relative 6.3 %   Eosinophils Relative 1.2 %   Basophils Relative 0.5 %  Lipase     Status: None   Collection Time: 01/14/19  7:51 AM  Result Value Ref Range   Lipase 25 7 - 60 U/L  SPECIMEN COMPROMISED     Status: None   Collection Time: 01/14/19  7:51 AM  Result Value Ref Range   Specimen Integrity      Comment: . Whole blood,  unspun or partially spun gel barrier tube was received more than 6 hours since collection. A  false elevation of K, Phos and LD as well as a false  decrease in glucose may occur due to prolonged contact  with red cells. .       PHQ2/9: Depression screen [facility] 2/9 03/24/2019 01/06/2019 12/22/2018 09/17/2018 06/13/2018  Decreased Interest 0 0 0 0 0  Down, Depressed, Hopeless 0 0 0 0 0  PHQ - 2 Score 0 0 0 0 0  Altered sleeping 0 0 0 3 0  Tired, decreased energy 0 0 0 0 0  Change in appetite 0 0 0 0 0  Feeling bad or failure about yourself  0 0 0 0 0  Trouble concentrating 0 0 0 0 0  Moving slowly or fidgety/restless 0 0 0 0 0  Suicidal thoughts 0 0 0 0 0  PHQ-9 Score 0 0 0 3 0  Difficult doing work/chores - - - Somewhat difficult Not difficult at all  Some recent data might be hidden    phq 9 is negative   Fall Risk: Fall Risk  03/24/2019 12/22/2018 09/17/2018 09/17/2018 06/30/2018  Falls in the past year? 0 0 0 0 0  Number falls in past yr: 0 0 0 0 0   Injury with Fall? 0 0 0 0 -     Functional Status Survey: Is the patient deaf or have difficulty hearing?: No Does the patient have difficulty seeing, even when wearing glasses/contacts?: No Does the patient have difficulty concentrating, remembering, or making decisions?: No Does the patient have difficulty walking or climbing stairs?: No Does the patient have difficulty dressing or bathing?: No Does the patient have difficulty doing errands alone such as visiting a doctor's office or shopping?: No    Assessment & Plan  1. Insomnia, persistent  - zolpidem (AMBIEN) 10 MG tablet; Take 1 tablet (10 mg total) by mouth at bedtime.  Dispense: 90 tablet; Refill: 0  2. COPD, mild ([place])  She is using Anoro prn only, still smoking   3. Morbid obesity ([place])  Discussed with the patient the risk posed by an increased BMI. Discussed importance of portion control, calorie counting and at least 150 minutes of physical activity weekly. Avoid sweet beverages and drink more water. Eat at least 6 servings of fruit and vegetables daily   4. Dysmetabolic syndrome   5. Need for hepatitis A and B vaccination  - Hepatitis B vaccine adult IM - Hepatitis A vaccine adult IM  6. Needs flu shot  - Flu Vaccine QUAD 36+ mos IM  7. Muscle spasm  - cyclobenzaprine (FLEXERIL) 10 MG tablet; Take 0.5-1 tablets (5-10 mg total) by mouth at bedtime.  Dispense: 90 tablet; Refill: 0  8. Benign essential HTN  At goal   9. Vitamin D deficiency  - Vitamin D, Ergocalciferol, (DRISDOL) 1.25 MG (50000 UT) CAPS capsule; Take 1 capsule (50,000 Units total) by mouth every 7 (seven) days.  Dispense: 12 capsule; Refill: 0  10. History of Helicobacter pylori infection  Doing well since treated 12/2018

## 2019-06-24 ENCOUNTER — Ambulatory Visit (INDEPENDENT_AMBULATORY_CARE_PROVIDER_SITE_OTHER): Payer: 59 | Admitting: Family Medicine

## 2019-06-24 ENCOUNTER — Encounter: Payer: Self-pay | Admitting: Family Medicine

## 2019-06-24 ENCOUNTER — Other Ambulatory Visit: Payer: Self-pay

## 2019-06-24 DIAGNOSIS — M62838 Other muscle spasm: Secondary | ICD-10-CM

## 2019-06-24 DIAGNOSIS — Z853 Personal history of malignant neoplasm of breast: Secondary | ICD-10-CM

## 2019-06-24 DIAGNOSIS — G620 Drug-induced polyneuropathy: Secondary | ICD-10-CM

## 2019-06-24 DIAGNOSIS — J302 Other seasonal allergic rhinitis: Secondary | ICD-10-CM

## 2019-06-24 DIAGNOSIS — E559 Vitamin D deficiency, unspecified: Secondary | ICD-10-CM

## 2019-06-24 DIAGNOSIS — I10 Essential (primary) hypertension: Secondary | ICD-10-CM

## 2019-06-24 DIAGNOSIS — Z1159 Encounter for screening for other viral diseases: Secondary | ICD-10-CM

## 2019-06-24 DIAGNOSIS — E538 Deficiency of other specified B group vitamins: Secondary | ICD-10-CM

## 2019-06-24 DIAGNOSIS — M8580 Other specified disorders of bone density and structure, unspecified site: Secondary | ICD-10-CM

## 2019-06-24 DIAGNOSIS — G4700 Insomnia, unspecified: Secondary | ICD-10-CM

## 2019-06-24 DIAGNOSIS — E785 Hyperlipidemia, unspecified: Secondary | ICD-10-CM

## 2019-06-24 DIAGNOSIS — T451X5A Adverse effect of antineoplastic and immunosuppressive drugs, initial encounter: Secondary | ICD-10-CM

## 2019-06-24 DIAGNOSIS — E8881 Metabolic syndrome: Secondary | ICD-10-CM | POA: Diagnosis not present

## 2019-06-24 DIAGNOSIS — J449 Chronic obstructive pulmonary disease, unspecified: Secondary | ICD-10-CM

## 2019-06-24 DIAGNOSIS — Z1231 Encounter for screening mammogram for malignant neoplasm of breast: Secondary | ICD-10-CM

## 2019-06-24 MED ORDER — GABAPENTIN 300 MG PO CAPS: 300.0000 mg | ORAL_CAPSULE | Freq: Every day | ORAL | 1 refills | Status: DC

## 2019-06-24 MED ORDER — ZOLPIDEM TARTRATE 10 MG PO TABS: 10.0000 mg | ORAL_TABLET | Freq: Every day | ORAL | 0 refills | Status: DC

## 2019-06-24 MED ORDER — FLUTICASONE PROPIONATE 50 MCG/ACT NA SUSP: 2.0000 | NASAL | 1 refills | Status: DC | PRN

## 2019-06-24 MED ORDER — ANORO ELLIPTA 62.5-25 MCG/INH IN AEPB: 1.0000 | INHALATION_SPRAY | Freq: Every day | RESPIRATORY_TRACT | 5 refills | Status: DC

## 2019-06-24 MED ORDER — CYCLOBENZAPRINE HCL 10 MG PO TABS: 5.0000 mg | ORAL_TABLET | Freq: Every day | ORAL | 0 refills | Status: DC

## 2019-06-24 MED ORDER — VITAMIN D (ERGOCALCIFEROL) 1.25 MG (50000 UNIT) PO CAPS: 50000.0000 [IU] | ORAL_CAPSULE | ORAL | 0 refills | Status: DC

## 2019-06-24 NOTE — Progress Notes (Signed)
Name: Brenda Jones   MRN: [id number]    DOB: Aug 09, 1965   Date:06/24/2019       Progress Note  Subjective  Chief Complaint  Chief Complaint  Patient presents with  . COPD  . Hypertension  . Insomnia    I connected with  Brenda Jones on 06/24/19 at  9:20 AM EST by telephone and verified that I am speaking with the correct person using two identifiers.  I discussed the limitations, risks, security and privacy concerns of performing an evaluation and management service by telephone and the availability of in person appointments. Staff also discussed with the patient that there may be a patient responsible charge related to this service. Patient Location: at work  Provider Location: [facility]   HPI  HTN: taking bp medication daily,now on losartan hctz, we decreased dose of diuretic when we added losartan but did not improve cramping,shecontinues to havecramping since chemo back in Oct 24, 2011. No chest pain, shortness of breat,edema anklesischronic.She had cough with ACE but not with ARB. She was not able to check her bp at home   History of h. Pylori: responded well to therapy June 2020, no symptoms, occasionally takes omeprazole when she spicy or greasy food.   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 Grayland Ormond, currently taking vitamin D rx, she had repeat bone density inJune 10-23-16 it showed mild improvement, continue supplementation, advised to recheck bone density   Insomnia: medication helps her fall and stay asleep, but wakes up at 2 am to go to work. She has been getting about 5-6 hours of sleep now, becauseshe has been working 12 hours shifts. She tried going down on Ambien dose to 5 mg but could not sleep, she is aware of FDA recommendations. She denies amnesia, or side effects of medication. She takes medication one hour before going to bed, advised to take it while in bed   COPD:She quit smoking for about one month but resumed  after father died 10-23-17. She is smoking 3/4pack a day since she was 53 yo, discussed importance of trying to quit again, pneumonia vaccines up to date, she also got her flu shot this season. She states cough is seldom, no wheezing or SOB. She has been using her inhaler/Anoro every other day to prevent symptoms   Muscles Spasms: got much worse with cancer therapy - in 24-Oct-2011 and takes Cyclobenzaprine to control symptoms, usually in the evening when she gets home from work . It can be on her trunk or legs, she has tried magnesium and co Q 10 without improvement of symptoms, so she stopped all supplements.She is aware to not take Ambien, Flexeril and gabapentin at the same time.   History of breast cancer left:missed follow up withDr. Grayland Ormond, last visit was in October 23, 2016, reminded her to re-schedule it. She is s/p lumpectomyshe completed 5 years of Anastrozole 03/2017. Last  Mammogram wasdone 06/2018, we will order another one today   Peripheral Neuropathy due to chemotherapy : still has daily pain on both legs and feet also also tips of fingers. She is on Gabapentin and has been able to take it during the day;but only takes it once a day, she does not want to see neurologist.She states pain is stable6-7/10 all the time, and it is unchanged   Obesity:BMI 25 with co-morbidities- OA, HTN,Metabolic syndrome. Discussed againimportance of life style modification. She states she gained some weight since Thanksgiving    Patient Active Problem List   Diagnosis Date Noted  .  Morbid obesity ([place]) 03/12/2018  . Osteoarthritis of right knee 08/29/2016  . Osteopenia due to cancer therapy 04/18/2015  . GERD (gastroesophageal reflux disease) 04/18/2015  . Muscle spasm 04/18/2015  . Benign neoplasm of descending colon   . Cervical high risk HPV (human papillomavirus) test positive 03/22/2015  . Benign essential HTN 01/08/2015  . Insomnia, persistent 01/08/2015  . COPD, mild ([place]) 01/08/2015   . HLD (hyperlipidemia) 01/08/2015  . Dysmetabolic syndrome [id number]  . Severe obesity (BMI 35.0-35.9 with comorbidity) ([place]) 01/08/2015  . Seasonal allergic rhinitis 01/08/2015  . Secondary peripheral neuropathy 01/08/2015  . Arthralgia of multiple joints 01/08/2015    Past Surgical History:  Procedure Laterality Date  . BLADDER SURGERY  2007  . BREAST BIOPSY Left 2013   invasive mammary carcinoma  . BREAST LUMPECTOMY Left 2013   invasive mammary carcinoma with 2 positive lymph nodes. Margins were clear.   Marland Kitchen BREAST SURGERY    . COLONOSCOPY WITH PROPOFOL N/A 04/04/2015   Procedure: COLONOSCOPY WITH PROPOFOL;  Surgeon: [name], MD;  Location: [place];  Service: Endoscopy;  Laterality: N/A;  . POLYPECTOMY  04/04/2015   Procedure: POLYPECTOMY;  Surgeon: [name], MD;  Location: [place];  Service: Endoscopy;;    Family History  Problem Relation Age of Onset  . Heart disease Mother   . Diabetes Father   . Hypertension Father     Social History   Socioeconomic History  . Marital status: Single    Spouse name: Not on file  . Number of children: 1  . Years of education: Not on file  . Highest education level: 12th grade  Occupational History  . Occupation: Radio producer.   Social Needs  . Financial resource strain: Not very hard  . Food insecurity    Worry: Never true    Inability: Never true  . Transportation needs    Medical: No    Non-medical: No  Tobacco Use  . Smoking status: Current Every Day Smoker    Packs/day: 0.75    Years: 36.00    Pack years: 27.00    Types: Cigarettes    Start date: 06/27/1981  . Smokeless tobacco: Never Used  Substance and Sexual Activity  . Alcohol use: No    Alcohol/week: 0.0 standard drinks  . Drug use: No  . Sexual activity: Not Currently    Partners: Male  Lifestyle  . Physical activity    Days per week: 0 days    Minutes per session: 0 min  . Stress: Not at all  Relationships  . Social  connections    Talks on phone: More than three times a week    Gets together: Once a week    Attends religious service: More than 4 times per year    Active member of club or organization: No    Attends meetings of clubs or organizations: Never    Relationship status: Never married  . Intimate partner violence    Fear of current or ex partner: No    Emotionally abused: No    Physically abused: No    Forced sexual activity: No  Other Topics Concern  . Not on file  Social History Narrative   She has one son, still lives at home, he helps her out financially also.      Current Outpatient Medications:  .  Cholecalciferol (VITAMIN D) 2000 units CAPS, Take 1 capsule (2,000 Units total) by mouth daily., Disp: 30 capsule, Rfl: 0 .  cyclobenzaprine (FLEXERIL)  10 MG tablet, Take 0.5-1 tablets (5-10 mg total) by mouth at bedtime., Disp: 90 tablet, Rfl: 0 .  fluticasone (FLONASE) 50 MCG/ACT nasal spray, Place 2 sprays into the nose as needed., Disp: , Rfl:  .  gabapentin (NEURONTIN) 300 MG capsule, Take 1 capsule (300 mg total) by mouth daily., Disp: 90 capsule, Rfl: 1 .  loratadine (CLARITIN) 10 MG tablet, Take 1 tablet (10 mg total) by mouth as needed for allergies., Disp: 30 tablet, Rfl: 2 .  losartan-hydrochlorothiazide (HYZAAR) 50-12.5 MG tablet, TAKE 1 TABLET BY MOUTH EVERY DAY, Disp: 90 tablet, Rfl: 3 .  omeprazole (PRILOSEC) 40 MG capsule, TAKE 1 CAPSULE BY MOUTH EVERY DAY, Disp: 90 capsule, Rfl: 1 .  umeclidinium-vilanterol (ANORO ELLIPTA) 62.5-25 MCG/INH AEPB, Inhale 1 puff into the lungs daily., Disp: 60 each, Rfl: 5 .  Vitamin D, Ergocalciferol, (DRISDOL) 1.25 MG (50000 UT) CAPS capsule, Take 1 capsule (50,000 Units total) by mouth every 7 (seven) days., Disp: 12 capsule, Rfl: 0 .  zolpidem (AMBIEN) 10 MG tablet, Take 1 tablet (10 mg total) by mouth at bedtime., Disp: 90 tablet, Rfl: 0  Allergies  Allergen Reactions  . Ace Inhibitors Cough    I personally reviewed active problem  list, medication list, allergies, family history, social history, health maintenance with the patient/caregiver today.   ROS  Ten systems reviewed and is negative except as mentioned in HPI   Objective  Virtual encounter, vitals not obtained.  There is no height or weight on file to calculate BMI.  Physical Exam  Awake, alert and oriented   PHQ2/9: Depression screen [facility] 2/9 06/24/2019 03/24/2019 01/06/2019 12/22/2018 09/17/2018  Decreased Interest 0 0 0 0 0  Down, Depressed, Hopeless 0 0 0 0 0  PHQ - 2 Score 0 0 0 0 0  Altered sleeping 0 0 0 0 3  Tired, decreased energy 0 0 0 0 0  Change in appetite 0 0 0 0 0  Feeling bad or failure about yourself  0 0 0 0 0  Trouble concentrating 0 0 0 0 0  Moving slowly or fidgety/restless 0 0 0 0 0  Suicidal thoughts 0 0 0 0 0  PHQ-9 Score 0 0 0 0 3  Difficult doing work/chores - - - - Somewhat difficult  Some recent data might be hidden   PHQ-2/9 Result is negative.    Fall Risk: Fall Risk  06/24/2019 03/24/2019 12/22/2018 09/17/2018 09/17/2018  Falls in the past year? 0 0 0 0 0  Number falls in past yr: 0 0 0 0 0  Injury with Fall? 0 0 0 0 0     Assessment & Plan  1. Insomnia, persistent  - zolpidem (AMBIEN) 10 MG tablet; Take 1 tablet (10 mg total) by mouth at bedtime.  Dispense: 90 tablet; Refill: 0  2. COPD, mild ([place])  - umeclidinium-vilanterol (ANORO ELLIPTA) 62.5-25 MCG/INH AEPB; Inhale 1 puff into the lungs daily.  Dispense: 60 each; Refill: 5  3. Morbid obesity ([place])  Discussed with the patient the risk posed by an increased BMI. Discussed importance of portion control, calorie counting and at least 150 minutes of physical activity weekly. Avoid sweet beverages and drink more water. Eat at least 6 servings of fruit and vegetables daily   4. Dysmetabolic syndrome  - Hemoglobin A1c  5. Benign essential HTN  - COMPLETE METABOLIC PANEL WITH GFR - CBC with Differential/Platelet  6. Vitamin D deficiency  - VITAMIN D 25  Hydroxy (Vit-D Deficiency, Fractures) - Vitamin  D, Ergocalciferol, (DRISDOL) 1.25 MG (50000 UT) CAPS capsule; Take 1 capsule (50,000 Units total) by mouth every 7 (seven) days.  Dispense: 12 capsule; Refill: 0  7. Low vitamin B12 level  - Vitamin B12  8. Dyslipidemia  - Lipid panel  9. Need for hepatitis C screening test  - Hepatitis C antibody  10. Peripheral neuropathy due to chemotherapy (HCC)  - gabapentin (NEURONTIN) 300 MG capsule; Take 1 capsule (300 mg total) by mouth daily.  Dispense: 90 capsule; Refill: 1  11. History of breast cancer  Needs to follow up with Dr. Grayland Ormond  12. Muscle spasm  - cyclobenzaprine (FLEXERIL) 10 MG tablet; Take 0.5-1 tablets (5-10 mg total) by mouth at bedtime.  Dispense: 90 tablet; Refill: 0  13. Encounter for screening mammogram for malignant neoplasm of breast  - MM Digital Screening; Future  14. Seasonal allergic rhinitis, unspecified trigger  - fluticasone (FLONASE) 50 MCG/ACT nasal spray; Place 2 sprays into both nostrils as needed.  Dispense: 16 g; Refill: 1  15. Osteopenia determined by x-ray  - DG Bone Density; Future  I discussed the assessment and treatment plan with the patient. The patient was provided an opportunity to ask questions and all were answered. The patient agreed with the plan and demonstrated an understanding of the instructions.   The patient was advised to call back or seek an in-person evaluation if the symptoms worsen or if the condition fails to improve as anticipated.  I provided 25  minutes of non-face-to-face time during this encounter.  Loistine Chance, MD

## 2019-06-26 ENCOUNTER — Other Ambulatory Visit: Payer: Self-pay | Admitting: Family Medicine

## 2019-06-26 DIAGNOSIS — R110 Nausea: Secondary | ICD-10-CM

## 2019-06-26 DIAGNOSIS — R1010 Upper abdominal pain, unspecified: Secondary | ICD-10-CM

## 2019-07-08 ENCOUNTER — Telehealth: Payer: Self-pay | Admitting: Family Medicine

## 2019-07-08 DIAGNOSIS — J302 Other seasonal allergic rhinitis: Secondary | ICD-10-CM

## 2019-07-08 NOTE — Telephone Encounter (Signed)
Requested medication (s) are due for refill today: yes  Requested medication (s) are on the active medication list: yes  Last refill: 06/24/2019  Future visit scheduled: yes  Notes to clinic:  Script for 16g with 1 refill was approved on 06/24/2019 and patient would like the 37ml with 1 refill Please review for approval  Dose: 2 spray Route: Each Nare Frequency: As needed  Dispense Quantity: 16 g Refills: 1        Sig: Place 2 sprays into both nostrils as needed.       Start Date: 06/24/19 End Date: --  Written Date: 06/24/19 Expiration Date: 06/23/20        Requested Prescriptions  Pending Prescriptions Disp Refills   fluticasone (FLONASE) 50 MCG/ACT nasal spray [Pharmacy Med Name: FLUTICASONE PROP 50 MCG SPRAY] 48 mL 1    Sig: Place 2 sprays into both nostrils as needed.      Ear, Nose, and Throat: Nasal Preparations - Corticosteroids Passed - 07/08/2019  2:31 PM      Passed - Valid encounter within last 12 months    Recent Outpatient Visits           2 weeks ago COPD, mild [facility])   [facility] Steele Sizer, MD   3 months ago COPD, mild [facility])   [facility] Steele Sizer, MD   6 months ago Right flank pain   [facility] Steele Sizer, MD   6 months ago Secondary peripheral neuropathy [facility])   [facility] Steele Sizer, MD   9 months ago COPD, mild [facility])   [facility] Steele Sizer, MD       Future Appointments             In 2 months Ancil Boozer, Drue Stager, MD [facility], [facility]

## 2019-09-28 ENCOUNTER — Ambulatory Visit: Payer: 59 | Admitting: Family Medicine

## 2019-12-09 ENCOUNTER — Other Ambulatory Visit: Payer: Self-pay

## 2019-12-09 ENCOUNTER — Ambulatory Visit: Payer: 59 | Admitting: Family Medicine

## 2019-12-09 ENCOUNTER — Encounter: Payer: Self-pay | Admitting: Family Medicine

## 2019-12-09 VITALS — BP 140/82 | HR 94 | Temp 97.5°F | Resp 16 | Ht 63.0 in | Wt 216.2 lb

## 2019-12-09 DIAGNOSIS — E559 Vitamin D deficiency, unspecified: Secondary | ICD-10-CM | POA: Diagnosis not present

## 2019-12-09 DIAGNOSIS — T451X5A Adverse effect of antineoplastic and immunosuppressive drugs, initial encounter: Secondary | ICD-10-CM

## 2019-12-09 DIAGNOSIS — Z23 Encounter for immunization: Secondary | ICD-10-CM | POA: Diagnosis not present

## 2019-12-09 DIAGNOSIS — G4700 Insomnia, unspecified: Secondary | ICD-10-CM | POA: Diagnosis not present

## 2019-12-09 DIAGNOSIS — I10 Essential (primary) hypertension: Secondary | ICD-10-CM | POA: Diagnosis not present

## 2019-12-09 DIAGNOSIS — J449 Chronic obstructive pulmonary disease, unspecified: Secondary | ICD-10-CM

## 2019-12-09 DIAGNOSIS — Z853 Personal history of malignant neoplasm of breast: Secondary | ICD-10-CM

## 2019-12-09 DIAGNOSIS — M62838 Other muscle spasm: Secondary | ICD-10-CM

## 2019-12-09 DIAGNOSIS — Z9889 Other specified postprocedural states: Secondary | ICD-10-CM

## 2019-12-09 DIAGNOSIS — G620 Drug-induced polyneuropathy: Secondary | ICD-10-CM

## 2019-12-09 DIAGNOSIS — E785 Hyperlipidemia, unspecified: Secondary | ICD-10-CM | POA: Diagnosis not present

## 2019-12-09 DIAGNOSIS — E538 Deficiency of other specified B group vitamins: Secondary | ICD-10-CM | POA: Diagnosis not present

## 2019-12-09 DIAGNOSIS — Z1159 Encounter for screening for other viral diseases: Secondary | ICD-10-CM | POA: Diagnosis not present

## 2019-12-09 DIAGNOSIS — K219 Gastro-esophageal reflux disease without esophagitis: Secondary | ICD-10-CM

## 2019-12-09 DIAGNOSIS — E8881 Metabolic syndrome: Secondary | ICD-10-CM | POA: Diagnosis not present

## 2019-12-09 MED ORDER — GABAPENTIN 300 MG PO CAPS: 300.0000 mg | ORAL_CAPSULE | Freq: Every day | ORAL | 1 refills | Status: DC

## 2019-12-09 MED ORDER — VITAMIN D (ERGOCALCIFEROL) 1.25 MG (50000 UNIT) PO CAPS: 50000.0000 [IU] | ORAL_CAPSULE | ORAL | 0 refills | Status: DC

## 2019-12-09 MED ORDER — ZOLPIDEM TARTRATE 10 MG PO TABS: 10.0000 mg | ORAL_TABLET | Freq: Every day | ORAL | 0 refills | Status: DC

## 2019-12-09 MED ORDER — CYCLOBENZAPRINE HCL 10 MG PO TABS: 5.0000 mg | ORAL_TABLET | Freq: Every day | ORAL | 0 refills | Status: DC

## 2019-12-09 MED ORDER — ANORO ELLIPTA 62.5-25 MCG/INH IN AEPB: 1.0000 | INHALATION_SPRAY | Freq: Every day | RESPIRATORY_TRACT | 5 refills | Status: DC

## 2019-12-09 MED ORDER — OMEPRAZOLE 40 MG PO CPDR: 40.0000 mg | DELAYED_RELEASE_CAPSULE | Freq: Every day | ORAL | 1 refills | Status: DC

## 2019-12-09 NOTE — Progress Notes (Signed)
Name: Brenda Jones   MRN: [id number]    DOB: 02-10-1966   Date:12/09/2019       Progress Note  Subjective  Chief Complaint  Chief Complaint  Patient presents with  . Medication Refill  . COPD  . Hypertension  . Gastroesophageal Reflux  . Hyperlipidemia  . Breast Problem    She has soreness in her breast.    HPI  HTN: taking bp medication daily,now on losartan hctz, we decreased dose of diuretic when we added losartan but did not improve cramping,shecontinues to havecramping since chemo back in 2011/11/09. No chest pain, or palpitation, she edema anklesischronic.She had cough with ACE but not with ARB. She was not able to check her bp at home , bp today is borderline but she does not want to adjust dose at thist time   History of h. Pylori: responded well to therapy June 2020, she is now having heartburn again, denies epigastric pain or indigestion. We will resume omeprazole daily, try to lose weight, avoid spicy or greasy food and if symptoms persists we will recheck for h. 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 Grayland Ormond, currently taking vitamin D rx, she had repeat bone density inJune 2016-11-08 it showed mild improvement, continue supplementation  Insomnia: medication helps her fall and stay asleep, but wakes up at 2 am to go to work. She has been getting about 5-6 hours of sleep now, becauseshe has been working 12 hours shifts. She tried going down on Ambien dose to 5 mg but could not sleep, she is aware of FDA recommendations.She denies amnesia, or side effects of medication. Unchanged   COPD:She quit smoking for about one month but resumed after father died 11-08-17. She is smoking 3/4pack a day . She started smoking at age 21 , discussed importance of trying to quit again, pneumonia vaccines up to date, discussed COVID -10 vaccine . She states cough is seldom, no wheezing , but has intermittent SOB with activity . She has been using her  inhaler/Anoro daily since allergy season to help her breath   Muscles Spasms: got much worse after chemotherapy  - in Nov 09, 2011 and takes Cyclobenzaprine to control symptoms, usually in the evening when she gets home from work . It can be on her trunk or legs, she has tried magnesium and co Q 10 without improvement of symptoms, so she stopped all supplements.She is aware to not take Ambien, Flexeril and gabapentin at the same time because of compound effects of sedation   History of breast cancer left:missed follow up withDr. Grayland Ormond, last visit was in 11-08-16, reminded her to re-schedule it. She is s/p lumpectomyshe completed 5 years of Anastrozole 03/2017. Last  Mammogram wasdone 06/2018, she has noticed a pain on the surgical site over the past month, she would like to go back for a mammogram , she also states her co-pay has decreased and will follow up with Dr. Grayland Ormond   Peripheral Neuropathy due to chemotherapy : still has daily pain on both legs and feet also also tips of fingers. She is on Gabapentin and has been able to take it during the day;but only takes it once a day, she does not want to see neurologist.She states pain is stable6-7/10 all the time Stable at this time   Morbid Obesity :BMI 35 with co-morbidities- OA, HTN,GERD, Metabolic syndrome. Discussed againimportance of life style modification. She has not been very physically active, she does not eat a balanced diet    Patient  Active Problem List   Diagnosis Date Noted  . History of breast cancer 06/24/2019  . Morbid obesity ([place]) 03/12/2018  . Osteoarthritis of right knee 08/29/2016  . Osteopenia due to cancer therapy 04/18/2015  . GERD (gastroesophageal reflux disease) 04/18/2015  . Muscle spasm 04/18/2015  . Benign neoplasm of descending colon   . Cervical high risk HPV (human papillomavirus) test positive 03/22/2015  . Benign essential HTN 01/08/2015  . Insomnia, persistent 01/08/2015  . COPD, mild ([place])  01/08/2015  . HLD (hyperlipidemia) 01/08/2015  . Dysmetabolic syndrome XX123456  . Severe obesity (BMI 35.0-35.9 with comorbidity) ([place]) 01/08/2015  . Seasonal allergic rhinitis 01/08/2015  . Neuropathy due to chemotherapeutic drug ([place]) 01/08/2015  . Arthralgia of multiple joints 01/08/2015    Past Surgical History:  Procedure Laterality Date  . BLADDER SURGERY  2007  . BREAST BIOPSY Left 2013   invasive mammary carcinoma  . BREAST LUMPECTOMY Left 2013   invasive mammary carcinoma with 2 positive lymph nodes. Margins were clear.   Marland Kitchen BREAST SURGERY    . COLONOSCOPY WITH PROPOFOL N/A 04/04/2015   Procedure: COLONOSCOPY WITH PROPOFOL;  Surgeon: [name], MD;  Location: [place];  Service: Endoscopy;  Laterality: N/A;  . POLYPECTOMY  04/04/2015   Procedure: POLYPECTOMY;  Surgeon: [name], MD;  Location: [place];  Service: Endoscopy;;    Family History  Problem Relation Age of Onset  . Heart disease Mother   . Diabetes Father   . Hypertension Father     Social History   Tobacco Use  . Smoking status: Current Every Day Smoker    Packs/day: 0.75    Years: 36.00    Pack years: 27.00    Types: Cigarettes    Start date: 06/27/1981  . Smokeless tobacco: Never Used  Substance Use Topics  . Alcohol use: No    Alcohol/week: 0.0 standard drinks     Current Outpatient Medications:  .  cyclobenzaprine (FLEXERIL) 10 MG tablet, Take 0.5-1 tablets (5-10 mg total) by mouth at bedtime., Disp: 90 tablet, Rfl: 0 .  fluticasone (FLONASE) 50 MCG/ACT nasal spray, PLACE 2 SPRAYS INTO BOTH NOSTRILS AS NEEDED., Disp: 48 mL, Rfl: 1 .  gabapentin (NEURONTIN) 300 MG capsule, Take 1 capsule (300 mg total) by mouth daily., Disp: 90 capsule, Rfl: 1 .  loratadine (CLARITIN) 10 MG tablet, Take 1 tablet (10 mg total) by mouth as needed for allergies., Disp: 30 tablet, Rfl: 2 .  losartan-hydrochlorothiazide (HYZAAR) 50-12.5 MG tablet, TAKE 1 TABLET BY MOUTH EVERY DAY, Disp:  90 tablet, Rfl: 3 .  omeprazole (PRILOSEC) 40 MG capsule, TAKE 1 CAPSULE BY MOUTH EVERY DAY, Disp: 90 capsule, Rfl: 1 .  umeclidinium-vilanterol (ANORO ELLIPTA) 62.5-25 MCG/INH AEPB, Inhale 1 puff into the lungs daily., Disp: 60 each, Rfl: 5 .  Vitamin D, Ergocalciferol, (DRISDOL) 1.25 MG (50000 UT) CAPS capsule, Take 1 capsule (50,000 Units total) by mouth every 7 (seven) days., Disp: 12 capsule, Rfl: 0 .  zolpidem (AMBIEN) 10 MG tablet, Take 1 tablet (10 mg total) by mouth at bedtime., Disp: 90 tablet, Rfl: 0  Allergies  Allergen Reactions  . Ace Inhibitors Cough    I personally reviewed active problem list, medication list, allergies, family history, social history, health maintenance with the patient/caregiver today.   ROS  Constitutional: Negative for fever or weight change.  Respiratory: Negative for cough and shortness of breath.   Cardiovascular: Negative for chest pain or palpitations.  Gastrointestinal: Negative for abdominal pain, no bowel changes.  Musculoskeletal: Negative for gait problem or joint swelling.  Skin: Negative for rash.  Neurological: Negative for dizziness or headache.  No other specific complaints in a complete review of systems (except as listed in HPI above).  Objective  Vitals:   12/09/19 1100  BP: 140/82  Pulse: 94  Resp: 16  Temp: (!) 97.5 F (36.4 C)  TempSrc: Temporal  SpO2: 98%  Weight: 216 lb 3.2 oz (98.1 kg)  Height: 5\' 3"  (1.6 m)    Body mass index is 38.3 kg/m.  Physical Exam  Constitutional: Patient appears well-developed and well-nourished. Obese  No distress.  HEENT: head atraumatic, normocephalic, pupils equal and reactive to light,, neck supple Cardiovascular: Normal rate, regular rhythm and normal heart sounds.  No murmur heard. No BLE edema. Pulmonary/Chest: Effort normal and breath sounds normal. No respiratory distress. Abdominal: Soft.  There is no tenderness. Psychiatric: Patient has a normal mood and affect.  behavior is normal. Judgment and thought content normal.  PHQ2/9: Depression screen [facility] 2/9 12/09/2019 06/24/2019 03/24/2019 01/06/2019 12/22/2018  Decreased Interest 0 0 0 0 0  Down, Depressed, Hopeless 0 0 0 0 0  PHQ - 2 Score 0 0 0 0 0  Altered sleeping 0 0 0 0 0  Tired, decreased energy 0 0 0 0 0  Change in appetite 0 0 0 0 0  Feeling bad or failure about yourself  0 0 0 0 0  Trouble concentrating 0 0 0 0 0  Moving slowly or fidgety/restless 0 0 0 0 0  Suicidal thoughts 0 0 0 0 0  PHQ-9 Score 0 0 0 0 0  Difficult doing work/chores - - - - -  Some recent data might be hidden    phq 9 is negative   Fall Risk: Fall Risk  12/09/2019 06/24/2019 03/24/2019 12/22/2018 09/17/2018  Falls in the past year? 0 0 0 0 0  Number falls in past yr: 0 0 0 0 0  Injury with Fall? 0 0 0 0 0      Functional Status Survey: Is the patient deaf or have difficulty hearing?: No Does the patient have difficulty seeing, even when wearing glasses/contacts?: No Does the patient have difficulty concentrating, remembering, or making decisions?: No Does the patient have difficulty walking or climbing stairs?: No Does the patient have difficulty dressing or bathing?: No Does the patient have difficulty doing errands alone such as visiting a doctor's office or shopping?: No    Assessment & Plan  1. History of breast cancer in female  - MM Digital Diagnostic Bilat; Future - US BREAST LTD UNI LEFT INC AXILLA; Future  2. History of lumpectomy of left breast  - MM Digital Diagnostic Bilat; Future - US BREAST LTD UNI LEFT INC AXILLA; Future  3. Insomnia, persistent  - zolpidem (AMBIEN) 10 MG tablet; Take 1 tablet (10 mg total) by mouth at bedtime.  Dispense: 90 tablet; Refill: 0  4. Vitamin D deficiency  - Vitamin D, Ergocalciferol, (DRISDOL) 1.25 MG (50000 UNIT) CAPS capsule; Take 1 capsule (50,000 Units total) by mouth every 7 (seven) days.  Dispense: 12 capsule; Refill: 0  5. COPD, mild ([place])  -  umeclidinium-vilanterol (ANORO ELLIPTA) 62.5-25 MCG/INH AEPB; Inhale 1 puff into the lungs daily.  Dispense: 60 each; Refill: 5  6. Benign essential HTN   7. Peripheral neuropathy due to chemotherapy (HCC)  - gabapentin (NEURONTIN) 300 MG capsule; Take 1 capsule (300 mg total) by mouth daily.  Dispense: 90 capsule; Refill: 1  8.  Muscle spasm  - cyclobenzaprine (FLEXERIL) 10 MG tablet; Take 0.5-1 tablets (5-10 mg total) by mouth at bedtime.  Dispense: 90 tablet; Refill: 0  9. Morbid obesity ([place])  Discussed with the patient the risk posed by an increased BMI. Discussed importance of portion control, calorie counting and at least 150 minutes of physical activity weekly. Avoid sweet beverages and drink more water. Eat at least 6 servings of fruit and vegetables daily   10. Need for hepatitis B vaccination  - Hepatitis B vaccine adult IM  11. Gastroesophageal reflux disease without esophagitis  - omeprazole (PRILOSEC) 40 MG capsule; Take 1 capsule (40 mg total) by mouth daily.  Dispense: 90 capsule; Refill: 1

## 2019-12-10 LAB — CBC WITH DIFFERENTIAL/PLATELET
Absolute Monocytes: 564 cells/uL (ref 200–950)
Basophils Absolute: 47 cells/uL (ref 0–200)
Basophils Relative: 0.5 %
Eosinophils Absolute: 122 cells/uL (ref 15–500)
Eosinophils Relative: 1.3 %
HCT: 40.4 % (ref 35.0–45.0)
Hemoglobin: 12.9 g/dL (ref 11.7–15.5)
Lymphs Abs: 3328 cells/uL (ref 850–3900)
MCH: 26.5 pg — ABNORMAL LOW (ref 27.0–33.0)
MCHC: 31.9 g/dL — ABNORMAL LOW (ref 32.0–36.0)
MCV: 83 fL (ref 80.0–100.0)
MPV: 11.1 fL (ref 7.5–12.5)
Monocytes Relative: 6 %
Neutro Abs: 5339 cells/uL (ref 1500–7800)
Neutrophils Relative %: 56.8 %
Platelets: 346 10*3/uL (ref 140–400)
RBC: 4.87 10*6/uL (ref 3.80–5.10)
RDW: 14.2 % (ref 11.0–15.0)
Total Lymphocyte: 35.4 %
WBC: 9.4 10*3/uL (ref 3.8–10.8)

## 2019-12-10 LAB — COMPLETE METABOLIC PANEL WITH GFR
AG Ratio: 1.6 (calc) (ref 1.0–2.5)
ALT: 17 U/L (ref 6–29)
AST: 17 U/L (ref 10–35)
Albumin: 4.2 g/dL (ref 3.6–5.1)
Alkaline phosphatase (APISO): 89 U/L (ref 37–153)
BUN: 9 mg/dL (ref 7–25)
CO2: 28 mmol/L (ref 20–32)
Calcium: 9.8 mg/dL (ref 8.6–10.4)
Chloride: 104 mmol/L (ref 98–110)
Creat: 0.84 mg/dL (ref 0.50–1.05)
GFR, Est African American: 91 mL/min/{1.73_m2} (ref >=60)
GFR, Est Non African American: 79 mL/min/{1.73_m2} (ref >=60)
Globulin: 2.7 g/dL (calc) (ref 1.9–3.7)
Glucose, Bld: 84 mg/dL (ref 65–99)
Potassium: 4 mmol/L (ref 3.5–5.3)
Sodium: 139 mmol/L (ref 135–146)
Total Bilirubin: 0.4 mg/dL (ref 0.2–1.2)
Total Protein: 6.9 g/dL (ref 6.1–8.1)

## 2019-12-10 LAB — HEPATITIS C ANTIBODY
Hepatitis C Ab: NONREACTIVE
SIGNAL TO CUT-OFF: 0.01 (ref <1.00)

## 2019-12-10 LAB — HEMOGLOBIN A1C
Hgb A1c MFr Bld: 5.9 % of total Hgb — ABNORMAL HIGH (ref <5.7)
Mean Plasma Glucose: 123 (calc)
eAG (mmol/L): 6.8 (calc)

## 2019-12-10 LAB — LIPID PANEL
Cholesterol: 147 mg/dL (ref <200)
HDL: 45 mg/dL — ABNORMAL LOW (ref >=50)
LDL Cholesterol (Calc): 86 mg/dL (calc)
Non-HDL Cholesterol (Calc): 102 mg/dL (calc) (ref <130)
Total CHOL/HDL Ratio: 3.3 (calc) (ref <5.0)
Triglycerides: 69 mg/dL (ref <150)

## 2019-12-10 LAB — VITAMIN D 25 HYDROXY (VIT D DEFICIENCY, FRACTURES): Vit D, 25-Hydroxy: 11 ng/mL — ABNORMAL LOW (ref 30–100)

## 2019-12-10 LAB — VITAMIN B12: Vitamin B-12: 372 pg/mL (ref 200–1100)

## 2019-12-13 ENCOUNTER — Other Ambulatory Visit: Payer: Self-pay | Admitting: Family Medicine

## 2019-12-13 DIAGNOSIS — E538 Deficiency of other specified B group vitamins: Secondary | ICD-10-CM

## 2019-12-13 MED ORDER — VITAMIN B-12 1000 MCG SL SUBL: 1.0000 | SUBLINGUAL_TABLET | SUBLINGUAL | 0 refills | Status: DC

## 2020-01-26 ENCOUNTER — Other Ambulatory Visit: Payer: Self-pay | Admitting: Emergency Medicine

## 2020-01-26 DIAGNOSIS — Z853 Personal history of malignant neoplasm of breast: Secondary | ICD-10-CM

## 2020-02-21 NOTE — Progress Notes (Addendum)
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  INTERVAL HISTORY: Patient returns to clinic today for reevaluation.  She has not been seen by Dr. Grayland Ormond since 2018.  Today, she says that she is having pain in her left breast, left upper outer quadrant at the end of her surgical scar.  This pain started 2 weeks ago she thought it was a pulled muscle but pain has persisted.  No skin changes and no palpable mass.  No discharge.  She tolerated anastrozole well without significant side effects and completed 5 years of therapy in 2018.  Continues to have peripheral neuropathy that is unchanged and not bothersome.  Denies hot flashes or night sweats.  Says she has a good appetite and denies weight loss.  No chest pain or shortness of breath.  Denies nausea, vomiting, constipation, diarrhea.  No urinary complaints.  She denies further specific complaints today.  Last mammogram was scheduled by her PCP and was 07/04/2018 and reported as BI-RADS Category 2: Benign.  REVIEW OF SYSTEMS:   Review of Systems  Constitutional: Negative.  Negative for fever, malaise/fatigue and weight loss.  Respiratory: Negative.  Negative for cough and shortness of breath.   Cardiovascular: Negative.  Negative for chest pain and leg swelling.  Gastrointestinal: Negative.  Negative for abdominal pain.  Genitourinary: Negative.   Musculoskeletal: Positive for joint pain.  Neurological: Positive for sensory change. Negative for weakness.  Psychiatric/Behavioral: Negative.  The patient is not nervous/anxious.     As per HPI. Otherwise, a complete review of systems is negative.  PAST MEDICAL HISTORY: Past Medical History:  Diagnosis  Date  . Allergy   . Arthritis   . Breast CA ([place])   . Breast cancer ([place]) 2013   left breast lumpectomy with chemo and rad tx  . COPD (chronic obstructive pulmonary disease) ([place])   . Headache    occasional - thinks it is from BP meds  . Hypertension   . Insomnia   . Metabolic syndrome   . Obesity   . Peripheral neuropathy     PAST SURGICAL HISTORY: Past Surgical History:  Procedure Laterality Date  . BLADDER SURGERY  2007  . BREAST BIOPSY Left 2013   invasive mammary carcinoma  . BREAST LUMPECTOMY Left 2013   invasive mammary carcinoma with 2 positive lymph nodes. Margins were clear.   Marland Kitchen BREAST SURGERY    . COLONOSCOPY WITH PROPOFOL N/A 04/04/2015   Procedure: COLONOSCOPY WITH PROPOFOL;  Surgeon: [name], MD;  Location: [place];  Service: Endoscopy;  Laterality: N/A;  . POLYPECTOMY  04/04/2015   Procedure: POLYPECTOMY;  Surgeon: [name], MD;  Location: [place];  Service: Endoscopy;;    FAMILY HISTORY Family History  Problem Relation Age of Onset  . Heart disease Mother   . Diabetes Father   . Hypertension Father        ADVANCED DIRECTIVES:    HEALTH MAINTENANCE: Social History   Tobacco Use  . Smoking status: Current Every Day Smoker    Packs/day: 0.75    Years: 36.00    Pack years: 27.00    Types: Cigarettes    Start date: 06/27/1981  . Smokeless tobacco: Never Used  Vaping Use  . Vaping Use:  Never used  Substance Use Topics  . Alcohol use: No    Alcohol/week: 0.0 standard drinks  . Drug use: No     Allergies  Allergen Reactions  . Ace Inhibitors Cough    Current Outpatient Medications  Medication Sig Dispense Refill  . Cyanocobalamin (VITAMIN B-12) 1000 MCG SUBL Place 1 tablet (1,000 mcg total) under the tongue 3 (three) times a week. 100 tablet 0  . cyclobenzaprine (FLEXERIL) 10 MG tablet Take 0.5-1 tablets (5-10 mg total) by mouth at bedtime. 90 tablet 0  . fluticasone (FLONASE) 50 MCG/ACT nasal spray PLACE 2  SPRAYS INTO BOTH NOSTRILS AS NEEDED. 48 mL 1  . gabapentin (NEURONTIN) 300 MG capsule Take 1 capsule (300 mg total) by mouth daily. 90 capsule 1  . loratadine (CLARITIN) 10 MG tablet Take 1 tablet (10 mg total) by mouth as needed for allergies. 30 tablet 2  . losartan-hydrochlorothiazide (HYZAAR) 50-12.5 MG tablet TAKE 1 TABLET BY MOUTH EVERY DAY 90 tablet 3  . omeprazole (PRILOSEC) 40 MG capsule Take 1 capsule (40 mg total) by mouth daily. 90 capsule 1  . umeclidinium-vilanterol (ANORO ELLIPTA) 62.5-25 MCG/INH AEPB Inhale 1 puff into the lungs daily. 60 each 5  . Vitamin D, Ergocalciferol, (DRISDOL) 1.25 MG (50000 UNIT) CAPS capsule Take 1 capsule (50,000 Units total) by mouth every 7 (seven) days. 12 capsule 0  . zolpidem (AMBIEN) 10 MG tablet Take 1 tablet (10 mg total) by mouth at bedtime. 90 tablet 0   No current facility-administered medications for this visit.    OBJECTIVE: Vitals:   02/26/20 1326  BP: (!) 149/77  Pulse: 96  Temp: 98.7 F (37.1 C)  SpO2: 96%     Body mass index is 38.17 kg/m.    ECOG FS:0 - Asymptomatic  General: Well-developed, well-nourished, no acute distress. Eyes: Pink conjunctiva, anicteric sclera Lungs: Clear to auscultation bilaterally. Heart: Regular rate and rhythm. Abdomen: Soft, nontender, nondistended. No organomegaly noted, normoactive bowel sounds. Musculoskeletal: No edema, cyanosis, or clubbing. Neuro: Alert, answering all questions appropriately. Cranial nerves grossly intact. Skin: No rashes or petechiae noted. Psych: Normal affect.. Breast: left breast: no palpable abnormality, surgical skin changes noted.     LAB RESULTS:  Lab Results  Component Value Date   NA 139 12/09/2019   K 4.0 12/09/2019   CL 104 12/09/2019   CO2 28 12/09/2019   GLUCOSE 84 12/09/2019   BUN 9 12/09/2019   CREATININE 0.84 12/09/2019   CALCIUM 9.8 12/09/2019   PROT 6.9 12/09/2019   ALBUMIN 4.2 03/19/2016   AST 17 12/09/2019   ALT 17 12/09/2019    ALKPHOS 111 03/19/2016   BILITOT 0.4 12/09/2019   GFRNONAA 79 12/09/2019   GFRAA 91 12/09/2019    Lab Results  Component Value Date   WBC 9.4 12/09/2019   NEUTROABS 5,339 12/09/2019   HGB 12.9 12/09/2019   HCT 40.4 12/09/2019   MCV 83.0 12/09/2019   PLT 346 12/09/2019     STUDIES: No results found.  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   PLAN:    1.  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  She completed 5 years of anastrozole in September 2018.  Most recent mammogram was reported as BI-RADS 2 in December 2019.  Given concerns of new breast changes and pain will get mammogram to evaluate and have her follow-up with  Dr. Grayland Ormond.  If benign, can consider yearly visits with medical oncology.  2.  Osteopenia-bone mineral density on January 17, 2017 slightly improved from 1 year prior to -1.8.  Continue calcium 1200 mg and vitamin D 800 IU daily along with weightbearing exercise as tolerated.  Will order bone density scan.  3.  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  Gabapentin now managed by her PCP.  Follow-up with Dr. Grayland Ormond after mammogram for results.   Patient expressed understanding and was in agreement with this plan. She also understands that She can call clinic at any time with any questions, concerns, or complaints.   Beckey Rutter, DNP, AGNP-C [place] at [facility] [phone] (clinic)  CC: Dr. Grayland Ormond

## 2020-02-26 ENCOUNTER — Inpatient Hospital Stay: Payer: BC Managed Care – PPO | Attending: Oncology | Admitting: Nurse Practitioner

## 2020-02-26 ENCOUNTER — Other Ambulatory Visit: Payer: Self-pay

## 2020-02-26 VITALS — BP 149/77 | HR 96 | Temp 98.7°F | Wt 215.5 lb

## 2020-02-26 DIAGNOSIS — G620 Drug-induced polyneuropathy: Secondary | ICD-10-CM | POA: Insufficient documentation

## 2020-02-26 DIAGNOSIS — T451X5A Adverse effect of antineoplastic and immunosuppressive drugs, initial encounter: Secondary | ICD-10-CM | POA: Insufficient documentation

## 2020-02-26 DIAGNOSIS — M2550 Pain in unspecified joint: Secondary | ICD-10-CM | POA: Diagnosis not present

## 2020-02-26 DIAGNOSIS — M8580 Other specified disorders of bone density and structure, unspecified site: Secondary | ICD-10-CM | POA: Insufficient documentation

## 2020-02-26 DIAGNOSIS — C50212 Malignant neoplasm of upper-inner quadrant of left female breast: Secondary | ICD-10-CM | POA: Insufficient documentation

## 2020-02-26 DIAGNOSIS — N644 Mastodynia: Secondary | ICD-10-CM

## 2020-02-26 DIAGNOSIS — Z170 Estrogen receptor positive status [ER+]: Secondary | ICD-10-CM | POA: Insufficient documentation

## 2020-02-26 DIAGNOSIS — Z853 Personal history of malignant neoplasm of breast: Secondary | ICD-10-CM | POA: Diagnosis not present

## 2020-02-26 DIAGNOSIS — F17210 Nicotine dependence, cigarettes, uncomplicated: Secondary | ICD-10-CM | POA: Diagnosis not present

## 2020-02-26 DIAGNOSIS — G4700 Insomnia, unspecified: Secondary | ICD-10-CM | POA: Diagnosis not present

## 2020-02-26 DIAGNOSIS — Z8249 Family history of ischemic heart disease and other diseases of the circulatory system: Secondary | ICD-10-CM | POA: Diagnosis not present

## 2020-02-26 DIAGNOSIS — Z79899 Other long term (current) drug therapy: Secondary | ICD-10-CM | POA: Diagnosis not present

## 2020-02-26 DIAGNOSIS — Z833 Family history of diabetes mellitus: Secondary | ICD-10-CM | POA: Insufficient documentation

## 2020-02-26 DIAGNOSIS — I10 Essential (primary) hypertension: Secondary | ICD-10-CM | POA: Diagnosis not present

## 2020-02-26 DIAGNOSIS — J449 Chronic obstructive pulmonary disease, unspecified: Secondary | ICD-10-CM | POA: Diagnosis not present

## 2020-02-29 ENCOUNTER — Other Ambulatory Visit: Payer: Self-pay | Admitting: Oncology

## 2020-02-29 DIAGNOSIS — Z853 Personal history of malignant neoplasm of breast: Secondary | ICD-10-CM

## 2020-03-07 ENCOUNTER — Other Ambulatory Visit: Payer: Self-pay | Admitting: Family Medicine

## 2020-03-07 DIAGNOSIS — R112 Nausea with vomiting, unspecified: Secondary | ICD-10-CM

## 2020-03-07 DIAGNOSIS — R1011 Right upper quadrant pain: Secondary | ICD-10-CM

## 2020-03-11 NOTE — Progress Notes (Signed)
[place]  Telephone:(336) [phone] Fax:(336) [phone]  ID: Karinda Cabriales OB: 1966/06/28  MR#: [id number]  ZGY#:[id number]  Patient Care Team: Steele Sizer, MD as PCP - General (Family Medicine)  I connected with [name] on 03/18/20 at 10:45 AM EDT by video enabled telemedicine visit and verified that I am speaking with the correct person using two identifiers.   I discussed the limitations, risks, security and privacy concerns of performing an evaluation and management service by telemedicine and the availability of in-person appointments. I also discussed with the patient that there may be a patient responsible charge related to this service. The patient expressed understanding and agreed to proceed.   Other persons participating in the visit and their role in the encounter: Patient, MD.  Patient's location: Clinic. Provider's location: Home.  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  INTERVAL HISTORY: Patient agreed to video assisted telemedicine visit for routine evaluation.  She was last seen in clinic greater than 3 years ago.  She returns to clinic today for evaluation and discussion of her mammogram results.  She continues to have mild left breast tenderness, but otherwise feels well.  She has no neurologic complaints.  She denies any recent fevers or illnesses.  She has a good appetite and denies weight loss.  She denies any chest pain, shortness of breath, cough, or hemoptysis.  She denies any nausea, vomiting, constipation, or diarrhea. She has no urinary complaints.  Patient offers no further specific complaints today.  REVIEW OF SYSTEMS:   Review of Systems  Constitutional: Negative.  Negative for fever, malaise/fatigue and weight loss.  Respiratory: Negative.  Negative for cough and shortness of breath.   Cardiovascular: Negative.  Negative for chest pain and leg  swelling.  Gastrointestinal: Negative.  Negative for abdominal pain.  Genitourinary: Negative.  Negative for dysuria.  Musculoskeletal: Negative.  Negative for joint pain.  Skin: Negative.  Negative for rash.  Neurological: Negative.  Negative for dizziness, sensory change, weakness and headaches.  Psychiatric/Behavioral: Negative.  The patient is not nervous/anxious.     As per HPI. Otherwise, a complete review of systems is negative.  PAST MEDICAL HISTORY: Past Medical History:  Diagnosis Date  . Allergy   . Arthritis   . Breast CA ([place])   . Breast cancer ([place]) 2013   left breast lumpectomy with chemo and rad tx  . COPD (chronic obstructive pulmonary disease) ([place])   . Headache    occasional - thinks it is from BP meds  . Hypertension   . Insomnia   . Metabolic syndrome   . Obesity   . Peripheral neuropathy   . Personal history of chemotherapy   . Personal history of radiation therapy     PAST SURGICAL HISTORY: Past Surgical History:  Procedure Laterality Date  . BLADDER SURGERY  2007  . BREAST BIOPSY Left 2013   invasive mammary carcinoma  . BREAST LUMPECTOMY Left 2013   invasive mammary carcinoma with 2 positive lymph nodes. Margins were clear.   Marland Kitchen BREAST SURGERY    . COLONOSCOPY WITH PROPOFOL N/A 04/04/2015   Procedure: COLONOSCOPY WITH PROPOFOL;  Surgeon: [name], MD;  Location: [place];  Service: Endoscopy;  Laterality: N/A;  . POLYPECTOMY  04/04/2015   Procedure: POLYPECTOMY;  Surgeon: [name], MD;  Location: [place];  Service: Endoscopy;;    FAMILY HISTORY Family History  Problem Relation Age of Onset  .  Heart disease Mother   . Diabetes Father   . Hypertension Father        ADVANCED DIRECTIVES:    HEALTH MAINTENANCE: Social History   Tobacco Use  . Smoking status: Current Every Day Smoker    Packs/day: 0.75    Years: 36.00    Pack years: 27.00    Types: Cigarettes    Start date: 06/27/1981  . Smokeless tobacco:  Never Used  Vaping Use  . Vaping Use: Never used  Substance Use Topics  . Alcohol use: No    Alcohol/week: 0.0 standard drinks  . Drug use: No     Allergies  Allergen Reactions  . Ace Inhibitors Cough    Current Outpatient Medications  Medication Sig Dispense Refill  . Cyanocobalamin (VITAMIN B-12) 1000 MCG SUBL Place 1 tablet (1,000 mcg total) under the tongue 3 (three) times a week. 100 tablet 0  . cyclobenzaprine (FLEXERIL) 10 MG tablet Take 0.5-1 tablets (5-10 mg total) by mouth at bedtime. 90 tablet 0  . fluticasone (FLONASE) 50 MCG/ACT nasal spray PLACE 2 SPRAYS INTO BOTH NOSTRILS AS NEEDED. 48 mL 1  . gabapentin (NEURONTIN) 300 MG capsule Take 1 capsule (300 mg total) by mouth daily. 90 capsule 1  . loratadine (CLARITIN) 10 MG tablet Take 1 tablet (10 mg total) by mouth as needed for allergies. 30 tablet 2  . losartan-hydrochlorothiazide (HYZAAR) 50-12.5 MG tablet TAKE 1 TABLET BY MOUTH EVERY DAY 90 tablet 3  . omeprazole (PRILOSEC) 40 MG capsule Take 1 capsule (40 mg total) by mouth daily. 90 capsule 1  . umeclidinium-vilanterol (ANORO ELLIPTA) 62.5-25 MCG/INH AEPB Inhale 1 puff into the lungs daily. 60 each 5  . Vitamin D, Ergocalciferol, (DRISDOL) 1.25 MG (50000 UNIT) CAPS capsule Take 1 capsule (50,000 Units total) by mouth every 7 (seven) days. 12 capsule 0  . zolpidem (AMBIEN) 10 MG tablet Take 1 tablet (10 mg total) by mouth at bedtime. 90 tablet 0   No current facility-administered medications for this visit.    OBJECTIVE: Vitals:   03/18/20 1046  BP: 134/82  Pulse: 82  Resp: 20  Temp: 98 F (36.7 C)  SpO2: 97%     Body mass index is 37.78 kg/m.    ECOG FS:0 - Asymptomatic  General: Well-developed, well-nourished, no acute distress. HEENT: Normocephalic. Breasts: Exam reported as normal by another provider. Neuro: Alert, answering all questions appropriately. Cranial nerves grossly intact. Psych: Normal affect.   LAB RESULTS:  Lab Results  Component  Value Date   NA 139 12/09/2019   K 4.0 12/09/2019   CL 104 12/09/2019   CO2 28 12/09/2019   GLUCOSE 84 12/09/2019   BUN 9 12/09/2019   CREATININE 0.84 12/09/2019   CALCIUM 9.8 12/09/2019   PROT 6.9 12/09/2019   ALBUMIN 4.2 03/19/2016   AST 17 12/09/2019   ALT 17 12/09/2019   ALKPHOS 111 03/19/2016   BILITOT 0.4 12/09/2019   GFRNONAA 79 12/09/2019   GFRAA 91 12/09/2019    Lab Results  Component Value Date   WBC 9.4 12/09/2019   NEUTROABS 5,339 12/09/2019   HGB 12.9 12/09/2019   HCT 40.4 12/09/2019   MCV 83.0 12/09/2019   PLT 346 12/09/2019     STUDIES: DG Bone Density  Result Date: 03/17/2020 EXAM: DUAL X-RAY ABSORPTIOMETRY (DXA) FOR BONE MINERAL DENSITY IMPRESSION: Dear Dr. Orlie Dakin, Your patient Jontae Sonier completed a FRAX assessment on 03/17/2020 using the Lunar iDXA DXA System (analysis version: 14.10) manufactured by [employer]. The following summarizes  the results of our evaluation. PATIENT BIOGRAPHICAL: Name: Zalika, Tieszen Patient ID: [id number] Birth Date: April 13, 1966 Height:    63.0 in. Gender:     [age]    [age]       Weight:    211.2 lbs. Ethnicity:  Black                            Exam Date: 03/17/2020 FRAX* RESULTS:  (version: 3.5) 10-year Probability of Fracture1 Major Osteoporotic Fracture2 Hip Fracture 2.2% 0.2% Population: [place] (Black) Risk Factors: Tobacco User (Current Smoker) Based on Femur (Right) Neck BMD 1 -The 10-year probability of fracture may be lower than reported if the patient has received treatment. 2 -Major Osteoporotic Fracture: Clinical Spine, Forearm, Hip or Shoulder *FRAX is a Materials engineer of the [employer] for Metabolic Bone Disease, a [place] (WHO) [facility]. ASSESSMENT: The probability of a major osteoporotic fracture is 2.2 within the next ten years. The probability of a hip fracture is 0.2 within the next ten years. . Your patient Carolynne Schuchard completed a BMD  test on 03/17/2020 using the Concepcion (software version: 14.10) manufactured by [employer]. The following summarizes the results of our evaluation. Technologist: [facility] PATIENT BIOGRAPHICAL: Name: Megin, Consalvo Patient ID: [id number] Birth Date: 05-11-1966 Height: 63.0 in. Gender: Female Exam Date: 03/17/2020 Weight: 211.2 lbs. Indications: History of Breast Cancer, History of Chemo, History of Radiation, Postmenopausal, Previous Chemo and Radiation, Tobacco User (Current Smoker) Fractures: Treatments: ASPRIN 81 MG, Vitamin D DENSITOMETRY RESULTS: Site      Region     Measured Date Measured Age WHO Classification Young Adult T-score BMD         %Change vs. Previous Significant Change (*) AP Spine L1-L4 03/17/2020 55.2 Osteopenia -1.6 0.994 g/cm2 -7.0% Yes AP Spine L1-L4 04/21/2013 48.2 Normal -1.0 1.069 g/cm2 -10.0% Yes AP Spine L1-L4 04/17/2012 47.2 Normal -0.1 1.188 g/cm2 - - DualFemur Neck Right 03/17/2020 55.2 Normal -0.9 0.916 g/cm2 -1.1% - DualFemur Neck Right 01/17/2017 52.0 Normal -0.8 0.926 g/cm2 -2.7% - DualFemur Neck Right 10/27/2015 50.8 Normal -0.6 0.952 g/cm2 -2.5% - DualFemur Neck Right 10/26/2014 49.8 Normal -0.4 0.976 g/cm2 -2.4% - DualFemur Neck Right 04/21/2013 48.2 Normal -0.3 1.000 g/cm2 0.1% - DualFemur Neck Right 04/17/2012 47.2 Normal -0.3 0.999 g/cm2 - - DualFemur Total Mean 03/17/2020 55.2 Normal -0.3 0.964 g/cm2 2.1% - DualFemur Total Mean 01/17/2017 52.0 Normal -0.5 0.944 g/cm2 -0.8% - DualFemur Total Mean 10/27/2015 50.8 Normal -0.4 0.952 g/cm2 -2.5% - DualFemur Total Mean 10/26/2014 49.8 Normal -0.3 0.976 g/cm2 -2.9% - DualFemur Total Mean 04/21/2013 48.2 Normal 0.0 1.005 g/cm2 -3.6% Yes DualFemur Total Mean 04/17/2012 47.2 Normal 0.3 1.043 g/cm2 - - ASSESSMENT: The BMD measured at AP Spine L1-L4 is 0.994 g/cm2 with a T-score of -1.6. This patient is considered osteopenic according to [facility]) criteria. The scan quality is good. Compared  with prior study, there has been significant decrease in the spine. Compared with prior study, there has been no significant change in the total hip. World Health Organization [facility]) criteria for post-menopausal, Caucasian Women: Normal:                   T-score at or above -1 SD Osteopenia/low bone mass: T-score between -1 and -2.5 SD Osteoporosis:             T-score at  or below -2.5 SD RECOMMENDATIONS: 1. All patients should optimize calcium and vitamin D intake. 2. Consider FDA-approved medical therapies in postmenopausal women and men aged 27 years and older, based on the following: a. A hip or vertebral(clinical or morphometric) fracture b. T-score < -2.5 at the femoral neck or spine after appropriate evaluation to exclude secondary causes c. Low bone mass (T-score between -1.0 and -2.5 at the femoral neck or spine) and a 10-year probability of a hip fracture > 3% or a 10-year probability of a major osteoporosis-related fracture > 20% based on the [place]-adapted WHO algorithm 3. Clinician judgment and/or patient preferences may indicate treatment for people with 10-year fracture probabilities above or below these levels FOLLOW-UP: People with diagnosed cases of osteoporosis or at high risk for fracture should have regular bone mineral density tests. For patients eligible for Medicare, routine testing is allowed once every 2 years. The testing frequency can be increased to one year for patients who have rapidly progressing disease, those who are receiving or discontinuing medical therapy to restore bone mass, or have additional risk factors. I have reviewed this report, and agree with the above findings. [facility] Electronically Signed   By: Lowella Grip III M.D.   On: 03/17/2020 10:51   US BREAST LTD UNI LEFT INC AXILLA  Result Date: 03/17/2020 CLINICAL DATA:  Focal pain in the left breast EXAM: DIGITAL DIAGNOSTIC BILATERAL MAMMOGRAM WITH CAD AND TOMO ULTRASOUND LEFT BREAST COMPARISON:   Previous exam(s). ACR Breast Density Category b: There are scattered areas of fibroglandular density. FINDINGS: The left lumpectomy site is stable. No suspicious masses, calcifications, or distortion are seen in either breast. Mammographic images were processed with CAD. On physical exam, no suspicious lumps are identified. Targeted ultrasound is performed, showing no evidence of malignancy in left breast. No cause for pain identified. IMPRESSION: No mammographic or sonographic evidence of malignancy. RECOMMENDATION: Treatment of the patient's pain should be based on clinical and physical exam given lack of imaging findings. Recommend annual screening mammography. I have discussed the findings and recommendations with the patient. If applicable, a reminder letter will be sent to the patient regarding the next appointment. BI-RADS CATEGORY  2: Benign. Electronically Signed   By: Dorise Bullion III M.D   On: 03/17/2020 11:46   MM DIAG BREAST TOMO BILATERAL  Result Date: 03/17/2020 CLINICAL DATA:  Focal pain in the left breast EXAM: DIGITAL DIAGNOSTIC BILATERAL MAMMOGRAM WITH CAD AND TOMO ULTRASOUND LEFT BREAST COMPARISON:  Previous exam(s). ACR Breast Density Category b: There are scattered areas of fibroglandular density. FINDINGS: The left lumpectomy site is stable. No suspicious masses, calcifications, or distortion are seen in either breast. Mammographic images were processed with CAD. On physical exam, no suspicious lumps are identified. Targeted ultrasound is performed, showing no evidence of malignancy in left breast. No cause for pain identified. IMPRESSION: No mammographic or sonographic evidence of malignancy. RECOMMENDATION: Treatment of the patient's pain should be based on clinical and physical exam given lack of imaging findings. Recommend annual screening mammography. I have discussed the findings and recommendations with the patient. If applicable, a reminder letter will be sent to the patient  regarding the next appointment. BI-RADS CATEGORY  2: Benign. 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   PLAN:    1.  Pathologic stage IIb ER/PR+, HER-2 negative  carcinoma of the upper inner quadrant of the left breast: No evidence of disease.  Patient completed 5 years of anastrozole in September 2018.  Her most recent mammogram on March 17, 2020 was reported as BI-RADS 2.  Repeat in August 2022.  Return to clinic in 1 year for routine evaluation.   2. Osteopenia: Bone mineral density completed on March 17, 2020 reported T score of -1.6 which is mildly improved from June 2018 where her T score was reported -1.8.  Continue calcium and vitamin D supplementation.  Additional bone mineral density can be monitored by primary care.     3. Peripheral neuropathy: Patient does not complain of this today.  Unclear if she is taking gabapentin.  I provided 20 minutes of face-to-face video visit time during this encounter which included chart review, counseling, and coordination of care as documented above.   Patient expressed understanding and was in agreement with this plan. She also understands that She can call clinic at any time with any questions, concerns, or complaints.    Lloyd Huger, MD   03/18/2020 12:37 PM

## 2020-03-17 ENCOUNTER — Ambulatory Visit
Admission: RE | Admit: 2020-03-17 | Discharge: 2020-03-17 | Disposition: A | Discharge status: Home / Self Care | Payer: BC Managed Care – PPO | Source: Ambulatory Visit | Attending: Oncology | Admitting: Oncology

## 2020-03-17 ENCOUNTER — Encounter: Payer: Self-pay | Admitting: Oncology

## 2020-03-17 ENCOUNTER — Other Ambulatory Visit: Payer: Self-pay

## 2020-03-17 DIAGNOSIS — Z853 Personal history of malignant neoplasm of breast: Secondary | ICD-10-CM

## 2020-03-17 DIAGNOSIS — R928 Other abnormal and inconclusive findings on diagnostic imaging of breast: Secondary | ICD-10-CM | POA: Diagnosis not present

## 2020-03-17 DIAGNOSIS — Z780 Asymptomatic menopausal state: Secondary | ICD-10-CM | POA: Diagnosis not present

## 2020-03-17 DIAGNOSIS — M8588 Other specified disorders of bone density and structure, other site: Secondary | ICD-10-CM | POA: Diagnosis not present

## 2020-03-17 DIAGNOSIS — N644 Mastodynia: Secondary | ICD-10-CM | POA: Diagnosis not present

## 2020-03-17 HISTORY — DX: Personal history of irradiation: Z92.3

## 2020-03-17 HISTORY — DX: Personal history of antineoplastic chemotherapy: Z92.21

## 2020-03-17 NOTE — Progress Notes (Signed)
Patient was called for pre assessment. She denies any concerns or questions at this time.

## 2020-03-18 ENCOUNTER — Inpatient Hospital Stay (HOSPITAL_BASED_OUTPATIENT_CLINIC_OR_DEPARTMENT_OTHER): Payer: BC Managed Care – PPO | Admitting: Oncology

## 2020-03-18 ENCOUNTER — Encounter: Payer: Self-pay | Admitting: Oncology

## 2020-03-18 ENCOUNTER — Other Ambulatory Visit: Payer: Self-pay

## 2020-03-18 VITALS — BP 134/82 | HR 82 | Temp 98.0°F | Resp 20 | Wt 213.3 lb

## 2020-03-18 DIAGNOSIS — Z853 Personal history of malignant neoplasm of breast: Secondary | ICD-10-CM

## 2020-03-31 ENCOUNTER — Other Ambulatory Visit: Payer: Self-pay | Admitting: Family Medicine

## 2020-03-31 DIAGNOSIS — I10 Essential (primary) hypertension: Secondary | ICD-10-CM

## 2020-03-31 DIAGNOSIS — E559 Vitamin D deficiency, unspecified: Secondary | ICD-10-CM

## 2020-03-31 NOTE — Telephone Encounter (Signed)
Requested medication (s) are due for refill today: yes  Requested medication (s) are on the active medication list: yes  Last refill:  12/09/2019  Future visit scheduled: yes  Notes to clinic:  this refill cannot be delegated    Requested Prescriptions  Pending Prescriptions Disp Refills   Vitamin D, Ergocalciferol, (DRISDOL) 1.25 MG (50000 UNIT) CAPS capsule [Pharmacy Med Name: VITAMIN D2 1.25MG (50,000 UNIT)] 12 capsule 0    Sig: TAKE 1 CAPSULE (50,000 UNITS TOTAL) BY MOUTH EVERY 7 (SEVEN) DAYS.      Endocrinology:  Vitamins - Vitamin D Supplementation Failed - 03/31/2020  2:02 PM      Failed - 50,000 IU strengths are not delegated      Failed - Phosphate in normal range and within 360 days    No results found for: PHOS        Failed - Vitamin D in normal range and within 360 days    Vit D, 25-Hydroxy  Date Value Ref Range Status  12/09/2019 11 (L) 30 - 100 ng/mL Final    Comment:    Vitamin D Status         25-OH Vitamin D: . Deficiency:                    <20 ng/mL Insufficiency:             20 - 29 ng/mL Optimal:                 > or = 30 ng/mL . For 25-OH Vitamin D testing on patients on  D2-supplementation and patients for whom quantitation  of D2 and D3 fractions is required, the [employer](TM) 25-OH VIT D, (D2,D3), LC/MS/MS is recommended: order  code [id number] (patients >5yrs). See Note 1 . Note 1 . For additional information, please refer to  http://education.[facility].com/faq/FAQ199  (This link is being provided for informational/ educational purposes only.)           Passed - Ca in normal range and within 360 days    Calcium  Date Value Ref Range Status  12/09/2019 9.8 8.6 - 10.4 mg/dL Final   Calcium, Total  Date Value Ref Range Status  01/25/2012 8.7 8.5 - 10.1 mg/dL Final          Passed - Valid encounter within last 12 months    Recent Outpatient Visits           3 months ago COPD, mild [facility])   [facility]  Steele Sizer, MD   9 months ago COPD, mild [facility])   [facility] Steele Sizer, MD   1 year ago COPD, mild [facility])   [facility] Steele Sizer, MD   1 year ago Right flank pain   [facility] Steele Sizer, MD   1 year ago Secondary peripheral neuropathy [facility])   [facility] Steele Sizer, MD       Future Appointments             In 2 months Steele Sizer, MD [facility]             Signed Prescriptions Disp Refills   losartan-hydrochlorothiazide (HYZAAR) 50-12.5 MG tablet 90 tablet 1    Sig: TAKE 1 TABLET BY MOUTH EVERY DAY      Cardiovascular: ARB + Diuretic Combos Passed - 03/31/2020  2:02 PM      Passed - K in normal range and within  180 days    Potassium  Date Value Ref Range Status  12/09/2019 4.0 3.5 - 5.3 mmol/L Final  01/25/2012 3.6 3.5 - 5.1 mmol/L Final          Passed - Na in normal range and within 180 days    Sodium  Date Value Ref Range Status  12/09/2019 139 135 - 146 mmol/L Final  01/25/2012 139 136 - 145 mmol/L Final          Passed - Cr in normal range and within 180 days    Creat  Date Value Ref Range Status  12/09/2019 0.84 0.50 - 1.05 mg/dL Final    Comment:    For patients >21 years of age, the reference limit for Creatinine is approximately 13% higher for people identified as African-American. .           Passed - Ca in normal range and within 180 days    Calcium  Date Value Ref Range Status  12/09/2019 9.8 8.6 - 10.4 mg/dL Final   Calcium, Total  Date Value Ref Range Status  01/25/2012 8.7 8.5 - 10.1 mg/dL Final          Passed - Patient is not pregnant      Passed - Last BP in normal range    BP Readings from Last 1 Encounters:  03/18/20 134/82          Passed - Valid encounter within last 6 months    Recent Outpatient Visits           3 months ago COPD, mild [facility])   [facility]  Steele Sizer, MD   9 months ago COPD, mild [facility])   [facility] Steele Sizer, MD   1 year ago COPD, mild [facility])   [facility] Steele Sizer, MD   1 year ago Right flank pain   [facility] Steele Sizer, MD   1 year ago Secondary peripheral neuropathy [facility])   [facility] Steele Sizer, MD       Future Appointments             In 2 months Ancil Boozer, Drue Stager, MD [facility]

## 2020-05-02 ENCOUNTER — Other Ambulatory Visit: Payer: Self-pay | Admitting: Family Medicine

## 2020-05-02 DIAGNOSIS — G4700 Insomnia, unspecified: Secondary | ICD-10-CM

## 2020-05-02 NOTE — Telephone Encounter (Signed)
Requested medication (s) are due for refill today: yes  Requested medication (s) are on the active medication list: yes  Last refill: 12/09/19  #90  0 refills  Future visit scheduled yes 06/10/20  Notes to clinic: not delegated  Requested Prescriptions  Pending Prescriptions Disp Refills   zolpidem (AMBIEN) 10 MG tablet [Pharmacy Med Name: ZOLPIDEM TARTRATE 10 MG TABLET] 90 tablet 0    Sig: TAKE 1 TABLET BY MOUTH EVERYDAY AT BEDTIME      Not Delegated - Psychiatry:  Anxiolytics/Hypnotics Failed - 05/02/2020  5:31 PM      Failed - This refill cannot be delegated      Failed - Urine Drug Screen completed in last 360 days.      Passed - Valid encounter within last 6 months    Recent Outpatient Visits           4 months ago COPD, mild [facility])   [facility] Steele Sizer, MD   10 months ago COPD, mild [facility])   [facility] Steele Sizer, MD   1 year ago COPD, mild [facility])   [facility] Steele Sizer, MD   1 year ago Right flank pain   [facility] Steele Sizer, MD   1 year ago Secondary peripheral neuropathy [facility])   [facility] Steele Sizer, MD       Future Appointments             In 1 month Ancil Boozer, Drue Stager, MD [facility]

## 2020-05-03 ENCOUNTER — Other Ambulatory Visit: Payer: Self-pay

## 2020-05-05 NOTE — Telephone Encounter (Signed)
The patient called this morning to check on her refill she requested on Monday 05/02/2020. She would like to get it refilled as soon as possible. Please advise

## 2020-05-06 ENCOUNTER — Other Ambulatory Visit: Payer: Self-pay

## 2020-05-06 DIAGNOSIS — G4700 Insomnia, unspecified: Secondary | ICD-10-CM

## 2020-05-08 MED ORDER — ZOLPIDEM TARTRATE 10 MG PO TABS: 10.0000 mg | ORAL_TABLET | Freq: Every day | ORAL | 0 refills | Status: DC

## 2020-05-12 ENCOUNTER — Other Ambulatory Visit: Payer: Self-pay | Admitting: Family Medicine

## 2020-05-12 DIAGNOSIS — M62838 Other muscle spasm: Secondary | ICD-10-CM

## 2020-05-12 NOTE — Telephone Encounter (Signed)
Requested medications are due for refill today yes  Requested medications are on the active medication list yes  Last refill 5/19  Last visit May 2021  Future visit scheduled Nov 2021  Notes to clinic Not Delegated

## 2020-06-09 NOTE — Progress Notes (Signed)
Name: Brenda Jones   MRN: [id number]    DOB: 15-Apr-1966   Date:06/10/2020       Progress Note  Subjective  Chief Complaint  Follow up   HPI   HTN: taking bp medication daily,now on losartan hctz, we decreased dose of diuretic when we added losartan but did not improve cramping,shecontinues to havecramping since chemo back in 10/27/11. No chest pain,palpitation or dizziness,  she has chronic edema on both ankles that has been stable. She had cough with ACE but not with ARB. She was not able to check her bp at home , bp last visit was 140 today is 138 we will adjust dose of losartan to 100/12.5   History of h. Pylori: responded well to therapy June 2020, she is now having heartburn again, denies epigastric pain or indigestion. We resumed Omeprazole and symptoms resolved, however insurance no longer paying for medication and is having symptoms again when she eats , we will send rx to [facility] but if symptoms persists, she can discussed with Dr. 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 Grayland Ormond, currently taking vitamin D rx, she had repeat bone density inJune 2016/10/26 it showed mild improvement, repeat done 08/26 showed a little drop at spine, but low FRAx, continue high calcium diet and vitamin D for now    Insomnia: medication helps her fall and stay asleep, but wakes up at 2 am to go to work. She has been getting about 5-6 hours of sleep now, becauseshe has been working 12 hours shifts. She tried going down on Ambien dose to 5 mg but could not sleep, she is aware of FDA recommendations.She denies amnesia, or side effects of medication. She knows it is a controlled medication and cannot share , sale or misuse   COPD:She quit smoking for about one month but resumed after father died October 26, 2017. She is smoking 3/4pack a day . She started smoking at age 42 , discussed importance of trying to quit again, pneumonia vaccines up to  date, discussed COVID -54 vaccine with patient again , she received flu shot today . She states cough is seldom, no wheezing , but has intermittent SOB with activity . She has been using her inhaler/Anoro only prn.   Muscles Spasms: got much worse after chemotherapy  - in 10/27/2011 and takes Cyclobenzaprine to control symptoms, usually in the evening when she gets home from work . It can be on her trunk or legs, she has tried magnesium and co Q 10 without improvement of symptoms, so she stopped all supplements.She is aware to not take Ambien, Flexeril and gabapentin at the same time because of compound effects of sedation She takes Flexeril and Gabapentin prn only   History of breast cancer left:missed follow up withDr. Grayland Ormond, last visit was in 2016/10/26, reminded her to re-schedule it. She is s/p lumpectomyshe completed 5 years of Anastrozole 03/2017. Last  Mammogram wasdone 06/2018, she has noticed a pain on the surgical site over the past month, she would like to go back for a mammogram , she also states her co-pay has decreased and will follow up with Dr. Grayland Ormond   Peripheral Neuropathy due to chemotherapy : still has daily pain on both legs and feet also also tips of fingers. She is on Gabapentin but not taking it daily, Lyrica did not work for her, Gabapentin makes her sleepy, advised to take it in the evening when she arrives from work. She states  pain is stable6-7/10 all the time and unchanged   Morbid Obesity :BMI 35 with co-morbidities- OA, HTN,GERD, Metabolic syndrome. Discussed againimportance of life style modification. She is still not exercising and likes to eat.    Patient Active Problem List   Diagnosis Date Noted  . History of breast cancer 06/24/2019  . Morbid obesity ([place]) 03/12/2018  . Osteoarthritis of right knee 08/29/2016  . Osteopenia due to cancer therapy 04/18/2015  . GERD (gastroesophageal reflux disease) 04/18/2015  . Muscle spasm 04/18/2015  . Benign  neoplasm of descending colon   . Cervical high risk HPV (human papillomavirus) test positive 03/22/2015  . Benign essential HTN 01/08/2015  . Insomnia, persistent 01/08/2015  . COPD, mild ([place]) 01/08/2015  . HLD (hyperlipidemia) 01/08/2015  . Dysmetabolic syndrome 62/83/1517  . Severe obesity (BMI 35.0-35.9 with comorbidity) ([place]) 01/08/2015  . Seasonal allergic rhinitis 01/08/2015  . Neuropathy due to chemotherapeutic drug ([place]) 01/08/2015  . Arthralgia of multiple joints 01/08/2015    Past Surgical History:  Procedure Laterality Date  . BLADDER SURGERY  2007  . BREAST BIOPSY Left 2013   invasive mammary carcinoma  . BREAST LUMPECTOMY Left 2013   invasive mammary carcinoma with 2 positive lymph nodes. Margins were clear.   [facility] Kitchen BREAST SURGERY    . COLONOSCOPY WITH PROPOFOL N/A 04/04/2015   Procedure: COLONOSCOPY WITH PROPOFOL;  Surgeon: [name], MD;  Location: [facility];  Service: Endoscopy;  Laterality: N/A;  . POLYPECTOMY  04/04/2015   Procedure: POLYPECTOMY;  Surgeon: [name], MD;  Location: [facility];  Service: Endoscopy;;    Family History  Problem Relation Age of Onset  . Heart disease Mother   . Diabetes Father   . Hypertension Father     Social History   Tobacco Use  . Smoking status: Current Every Day Smoker    Packs/day: 0.75    Years: 36.00    Pack years: 27.00    Types: Cigarettes    Start date: 06/27/1981  . Smokeless tobacco: Never Used  Substance Use Topics  . Alcohol use: No    Alcohol/week: 0.0 standard drinks     Current Outpatient Medications:  .  Cyanocobalamin (VITAMIN B-12) 1000 MCG SUBL, Place 1 tablet (1,000 mcg total) under the tongue 3 (three) times a week., Disp: 100 tablet, Rfl: 0 .  cyclobenzaprine (FLEXERIL) 10 MG tablet, TAKE 0.5-1 TABLETS (5-10 MG TOTAL) BY MOUTH AT BEDTIME., Disp: 90 tablet, Rfl: 0 .  fluticasone (FLONASE) 50 MCG/ACT nasal spray, PLACE 2 SPRAYS INTO BOTH NOSTRILS AS NEEDED., Disp: 48 mL, Rfl:  1 .  gabapentin (NEURONTIN) 300 MG capsule, Take 1 capsule (300 mg total) by mouth daily., Disp: 90 capsule, Rfl: 1 .  loratadine (CLARITIN) 10 MG tablet, Take 1 tablet (10 mg total) by mouth as needed for allergies., Disp: 30 tablet, Rfl: 2 .  losartan-hydrochlorothiazide (HYZAAR) 50-12.5 MG tablet, TAKE 1 TABLET BY MOUTH EVERY DAY, Disp: 90 tablet, Rfl: 1 .  omeprazole (PRILOSEC) 40 MG capsule, Take 1 capsule (40 mg total) by mouth daily., Disp: 90 capsule, Rfl: 1 .  umeclidinium-vilanterol (ANORO ELLIPTA) 62.5-25 MCG/INH AEPB, Inhale 1 puff into the lungs daily., Disp: 60 each, Rfl: 5 .  Vitamin D, Ergocalciferol, (DRISDOL) 1.25 MG (50000 UNIT) CAPS capsule, TAKE 1 CAPSULE (50,000 UNITS TOTAL) BY MOUTH EVERY 7 (SEVEN) DAYS., Disp: 12 capsule, Rfl: 0 .  zolpidem (AMBIEN) 10 MG tablet, Take 1 tablet (10 mg total) by mouth at bedtime., Disp: 30 tablet, Rfl: 0  Allergies  Allergen Reactions  . Ace Inhibitors Cough    I personally reviewed active problem list, medication list, allergies, family history, social history, health maintenance with the patient/caregiver today.   ROS  Constitutional: Negative for fever or weight change.  Respiratory: Negative for cough and shortness of breath.   Cardiovascular: Negative for chest pain or palpitations.  Gastrointestinal: Negative for abdominal pain, no bowel changes.  Musculoskeletal: Negative for gait problem or joint swelling.  Skin: Negative for rash.  Neurological: Negative for dizziness or headache.  No other specific complaints in a complete review of systems (except as listed in HPI above).  Objective  Vitals:   06/10/20 0830  BP: 138/82  Pulse: 91  Resp: 17  Temp: 97.9 F (36.6 C)  TempSrc: Oral  SpO2: 99%  Weight: 218 lb 8 oz (99.1 kg)  Height: 5\' 3"  (1.6 m)    Body mass index is 38.71 kg/m.  Physical Exam  Constitutional: Patient appears well-developed and well-nourished. Obese  No distress.  HEENT: head atraumatic,  normocephalic, pupils equal and reactive to light,  neck supple Cardiovascular: Normal rate, regular rhythm and normal heart sounds.  No murmur heard. No BLE edema. Pulmonary/Chest: Effort normal and breath sounds normal. No respiratory distress. Abdominal: Soft.  There is no tenderness. Psychiatric: Patient has a normal mood and affect. behavior is normal. Judgment and thought content normal.  PHQ2/9: Depression screen [facility] 2/9 06/10/2020 12/09/2019 06/24/2019 03/24/2019 01/06/2019  Decreased Interest 0 0 0 0 0  Down, Depressed, Hopeless 0 0 0 0 0  PHQ - 2 Score 0 0 0 0 0  Altered sleeping 3 0 0 0 0  Tired, decreased energy 1 0 0 0 0  Change in appetite 0 0 0 0 0  Feeling bad or failure about yourself  0 0 0 0 0  Trouble concentrating 0 0 0 0 0  Moving slowly or fidgety/restless 0 0 0 0 0  Suicidal thoughts 0 0 0 0 0  PHQ-9 Score 4 0 0 0 0  Difficult doing work/chores Not difficult at all - - - -  Some recent data might be hidden    phq 9 is negative   Fall Risk: Fall Risk  06/10/2020 12/09/2019 06/24/2019 03/24/2019 12/22/2018  Falls in the past year? 0 0 0 0 0  Number falls in past yr: 0 0 0 0 0  Injury with Fall? 0 0 0 0 0     Functional Status Survey: Is the patient deaf or have difficulty hearing?: No Does the patient have difficulty seeing, even when wearing glasses/contacts?: No Does the patient have difficulty concentrating, remembering, or making decisions?: No Does the patient have difficulty walking or climbing stairs?: No Does the patient have difficulty dressing or bathing?: No Does the patient have difficulty doing errands alone such as visiting a doctor's office or shopping?: No    Assessment & Plan  1. COPD, mild ([place])   2. Benign essential HTN  - losartan-hydrochlorothiazide (HYZAAR) 100-12.5 MG tablet; Take 1 tablet by mouth daily. NEW DOSE  Dispense: 90 tablet; Refill: 0  3. Need for immunization against influenza  - Flu Vaccine QUAD 36+ mos IM  4.  Colon cancer screening  - Ambulatory referral to Gastroenterology  5. Morbid obesity ([place])  Discussed with the patient the risk posed by an increased BMI. Discussed importance of portion control, calorie counting and at least 150 minutes of physical activity weekly. Avoid sweet beverages and drink more water. Eat at least 6 servings of  fruit and vegetables daily   6. Low vitamin B12 level   7. Vitamin D deficiency  - Vitamin D, Ergocalciferol, (DRISDOL) 1.25 MG (50000 UNIT) CAPS capsule; Take 1 capsule (50,000 Units total) by mouth every 7 (seven) days.  Dispense: 12 capsule; Refill: 0  8. History of lumpectomy of left breast   9. Gastroesophageal reflux disease without esophagitis  - omeprazole (PRILOSEC) 40 MG capsule; Take 1 capsule (40 mg total) by mouth daily.  Dispense: 90 capsule; Refill: 1  10. Insomnia, persistent  - zolpidem (AMBIEN) 10 MG tablet; Take 1 tablet (10 mg total) by mouth at bedtime.  Dispense: 90 tablet; Refill: 0  11. Seasonal allergic rhinitis, unspecified trigger   12. Dyslipidemia   13. Peripheral neuropathy due to chemotherapy (H[facility])  - gabapentin (NEURONTIN) 300 MG capsule; Take 1 capsule (300 mg total) by mouth daily.  Dispense: 90 capsule; Refill: 1

## 2020-06-10 ENCOUNTER — Ambulatory Visit: Payer: BC Managed Care – PPO | Admitting: Family Medicine

## 2020-06-10 ENCOUNTER — Encounter: Payer: Self-pay | Admitting: Family Medicine

## 2020-06-10 ENCOUNTER — Other Ambulatory Visit: Payer: Self-pay

## 2020-06-10 VITALS — BP 138/82 | HR 91 | Temp 97.9°F | Resp 17 | Ht 63.0 in | Wt 218.5 lb

## 2020-06-10 DIAGNOSIS — E559 Vitamin D deficiency, unspecified: Secondary | ICD-10-CM

## 2020-06-10 DIAGNOSIS — I10 Essential (primary) hypertension: Secondary | ICD-10-CM

## 2020-06-10 DIAGNOSIS — J302 Other seasonal allergic rhinitis: Secondary | ICD-10-CM

## 2020-06-10 DIAGNOSIS — J449 Chronic obstructive pulmonary disease, unspecified: Secondary | ICD-10-CM | POA: Diagnosis not present

## 2020-06-10 DIAGNOSIS — G4700 Insomnia, unspecified: Secondary | ICD-10-CM

## 2020-06-10 DIAGNOSIS — E538 Deficiency of other specified B group vitamins: Secondary | ICD-10-CM

## 2020-06-10 DIAGNOSIS — G620 Drug-induced polyneuropathy: Secondary | ICD-10-CM

## 2020-06-10 DIAGNOSIS — K219 Gastro-esophageal reflux disease without esophagitis: Secondary | ICD-10-CM

## 2020-06-10 DIAGNOSIS — Z9889 Other specified postprocedural states: Secondary | ICD-10-CM

## 2020-06-10 DIAGNOSIS — Z1211 Encounter for screening for malignant neoplasm of colon: Secondary | ICD-10-CM

## 2020-06-10 DIAGNOSIS — T451X5A Adverse effect of antineoplastic and immunosuppressive drugs, initial encounter: Secondary | ICD-10-CM

## 2020-06-10 DIAGNOSIS — E785 Hyperlipidemia, unspecified: Secondary | ICD-10-CM

## 2020-06-10 DIAGNOSIS — Z23 Encounter for immunization: Secondary | ICD-10-CM

## 2020-06-10 MED ORDER — ZOLPIDEM TARTRATE 10 MG PO TABS: 10.0000 mg | ORAL_TABLET | Freq: Every day | ORAL | 0 refills | Status: DC

## 2020-06-10 MED ORDER — GABAPENTIN 300 MG PO CAPS: 300.0000 mg | ORAL_CAPSULE | Freq: Every day | ORAL | 1 refills | Status: DC

## 2020-06-10 MED ORDER — VITAMIN D (ERGOCALCIFEROL) 1.25 MG (50000 UNIT) PO CAPS: 50000.0000 [IU] | ORAL_CAPSULE | ORAL | 0 refills | Status: DC

## 2020-06-10 MED ORDER — OMEPRAZOLE 40 MG PO CPDR: 40.0000 mg | DELAYED_RELEASE_CAPSULE | Freq: Every day | ORAL | 1 refills | Status: DC

## 2020-06-10 MED ORDER — LOSARTAN POTASSIUM-HCTZ 100-12.5 MG PO TABS: 1.0000 | ORAL_TABLET | Freq: Every day | ORAL | 0 refills | Status: DC

## 2020-06-20 ENCOUNTER — Telehealth (INDEPENDENT_AMBULATORY_CARE_PROVIDER_SITE_OTHER): Payer: Self-pay | Admitting: Gastroenterology

## 2020-06-20 ENCOUNTER — Other Ambulatory Visit: Payer: Self-pay

## 2020-06-20 DIAGNOSIS — Z86010 Personal history of colonic polyps: Secondary | ICD-10-CM

## 2020-06-20 MED ORDER — NA SULFATE-K SULFATE-MG SULF 17.5-3.13-1.6 GM/177ML PO SOLN: 1.0000 | Freq: Once | ORAL | 0 refills | Status: AC

## 2020-06-20 NOTE — Progress Notes (Signed)
Gastroenterology Pre-Procedure Review  Request Date: 07/19/20 Requesting Physician: Dr. Allen Norris  PATIENT REVIEW QUESTIONS: The patient responded to the following health history questions as indicated:    1. Are you having any GI issues? no 2. Do you have a personal history of Polyps? yes (04/04/15 colon polyps noted on colonoscopy report performed by Dr. Allen Norris) 3. Do you have a family history of Colon Cancer or Polyps? no 4. Diabetes Mellitus? no 5. Joint replacements in the past 12 months?no 6. Major health problems in the past 3 months?no 7. Any artificial heart valves, MVP, or defibrillator?no    MEDICATIONS & ALLERGIES:    Patient reports the following regarding taking any anticoagulation/antiplatelet therapy:   Plavix, Coumadin, Eliquis, Xarelto, Lovenox, Pradaxa, Brilinta, or Effient? no Aspirin? no  Patient confirms/reports the following medications:  Current Outpatient Medications  Medication Sig Dispense Refill  . Cyanocobalamin (VITAMIN B-12) 1000 MCG SUBL Place 1 tablet (1,000 mcg total) under the tongue 3 (three) times a week. 100 tablet 0  . cyclobenzaprine (FLEXERIL) 10 MG tablet TAKE 0.5-1 TABLETS (5-10 MG TOTAL) BY MOUTH AT BEDTIME. 90 tablet 0  . fluticasone (FLONASE) 50 MCG/ACT nasal spray PLACE 2 SPRAYS INTO BOTH NOSTRILS AS NEEDED. 48 mL 1  . gabapentin (NEURONTIN) 300 MG capsule Take 1 capsule (300 mg total) by mouth daily. 90 capsule 1  . loratadine (CLARITIN) 10 MG tablet Take 1 tablet (10 mg total) by mouth as needed for allergies. 30 tablet 2  . losartan-hydrochlorothiazide (HYZAAR) 100-12.5 MG tablet Take 1 tablet by mouth daily. NEW DOSE 90 tablet 0  . omeprazole (PRILOSEC) 40 MG capsule Take 1 capsule (40 mg total) by mouth daily. 90 capsule 1  . umeclidinium-vilanterol (ANORO ELLIPTA) 62.5-25 MCG/INH AEPB Inhale 1 puff into the lungs daily. 60 each 5  . Vitamin D, Ergocalciferol, (DRISDOL) 1.25 MG (50000 UNIT) CAPS capsule Take 1 capsule (50,000 Units total) by  mouth every 7 (seven) days. 12 capsule 0  . zolpidem (AMBIEN) 10 MG tablet Take 1 tablet (10 mg total) by mouth at bedtime. 90 tablet 0  . Na Sulfate-K Sulfate-Mg Sulf 17.5-3.13-1.6 GM/177ML SOLN Take 1 kit by mouth once for 1 dose. 354 mL 0   No current facility-administered medications for this visit.    Patient confirms/reports the following allergies:  Allergies  Allergen Reactions  . Ace Inhibitors Cough    No orders of the defined types were placed in this encounter.   AUTHORIZATION INFORMATION Primary Insurance: 1D#: Group #:  Secondary Insurance: 1D#: Group #:  SCHEDULE INFORMATION: Date: 07/19/20 Time: Location:[facility]

## 2020-07-12 NOTE — Patient Instructions (Signed)

## 2020-07-12 NOTE — Progress Notes (Signed)
Name: Brenda Jones   MRN: [id number]    DOB: 1965/12/20   Date:07/14/2020       Progress Note  Subjective  Chief Complaint  Annual Exam   HPI  Patient presents for annual CPE.  Diet: high in calcium, fruit and vegetables, still eating fast food occasionally  Exercise: active job, works long hours   [place] from 06/10/2020 in [facility]  AUDIT-C Score 0     Depression: Phq 9 is  negative Depression screen [facility] 2/9 07/14/2020 06/10/2020 12/09/2019 06/24/2019 03/24/2019  Decreased Interest 0 0 0 0 0  Down, Depressed, Hopeless 0 0 0 0 0  PHQ - 2 Score 0 0 0 0 0  Altered sleeping 3 3 0 0 0  Tired, decreased energy 0 1 0 0 0  Change in appetite 0 0 0 0 0  Feeling bad or failure about yourself  0 0 0 0 0  Trouble concentrating 0 0 0 0 0  Moving slowly or fidgety/restless 0 0 0 0 0  Suicidal thoughts 0 0 0 0 0  PHQ-9 Score 3 4 0 0 0  Difficult doing work/chores - Not difficult at all - - -  Some recent data might be hidden   Hypertension: BP Readings from Last 3 Encounters:  07/14/20 133/80  06/10/20 138/82  03/18/20 134/82   Obesity: Wt Readings from Last 3 Encounters:  07/14/20 217 lb 1.6 oz (98.5 kg)  06/10/20 218 lb 8 oz (99.1 kg)  03/18/20 213 lb 4.8 oz (96.8 kg)   BMI Readings from Last 3 Encounters:  07/14/20 38.46 kg/m  06/10/20 38.71 kg/m  03/18/20 37.78 kg/m     Vaccines:   Shingrix: discussed with patient  Pneumonia: N/A educated and discussed with patient. Flu: 06/10/2020, educated and discussed with patient.  Hep C Screening: 12/09/2019 STD testing and prevention (HIV/chl/gon/syphilis): not interested  Intimate partner violence: negative screen  Sexual History : not sexually active in 8 years  Menstrual History/LMP/Abnormal Bleeding: discussed post-menopausal bleeding  Incontinence Symptoms:   Breast cancer:  - Last Mammogram: 03/17/2020 - BRCA gene screening: N/A  Osteoporosis: Discussed high calcium  and vitamin D supplementation, weight bearing exercises  Cervical cancer screening: 06/30/2018  Skin cancer: Discussed monitoring for atypical lesions  Colorectal cancer: Scheduled for 07/19/2020 Lung cancer:   Low Dose CT Chest recommended if Age 42-80 years, 20 pack-year currently smoking OR have quit w/in 15years. Patient does qualify. Smoking since age 83 at times up to 1 pack daily ECG: 08/30/2011  Advanced Care Planning: A voluntary discussion about advance care planning including the explanation and discussion of advance directives.  Discussed health care proxy and Living will, and the patient was able to identify a health care proxy as son .  Patient does have a living will at present time.  Lipids: Lab Results  Component Value Date   CHOL 147 12/09/2019   CHOL 128 06/13/2018   CHOL 142 05/30/2017   Lab Results  Component Value Date   HDL 45 (L) 12/09/2019   HDL 40 (L) 06/13/2018   HDL 47 (L) 05/30/2017   Lab Results  Component Value Date   LDLCALC 86 12/09/2019   LDLCALC 74 06/13/2018   LDLCALC 79 05/30/2017   Lab Results  Component Value Date   TRIG 69 12/09/2019   TRIG 55 06/13/2018   TRIG 79 05/30/2017   Lab Results  Component Value Date   CHOLHDL 3.3 12/09/2019   CHOLHDL 3.2 06/13/2018  CHOLHDL 3.0 05/30/2017   No results found for: LDLDIRECT  Glucose: Glucose  Date Value Ref Range Status  01/25/2012 148 (H) 65 - 99 mg/dL Final  01/17/2012 124 (H) 65 - 99 mg/dL Final  01/11/2012 116 (H) 65 - 99 mg/dL Final   Glucose, Bld  Date Value Ref Range Status  12/09/2019 84 65 - 99 mg/dL Final    Comment:    .            Fasting reference interval .   01/14/2019 97 65 - 99 mg/dL Final    Comment:    .            Fasting reference interval .   06/13/2018 82 65 - 99 mg/dL Final    Comment:    .            Fasting reference interval .     Patient Active Problem List   Diagnosis Date Noted  . History of breast cancer 06/24/2019  . Morbid  obesity ([place]) 03/12/2018  . Osteoarthritis of right knee 08/29/2016  . Osteopenia due to cancer therapy 04/18/2015  . GERD (gastroesophageal reflux disease) 04/18/2015  . Muscle spasm 04/18/2015  . Benign neoplasm of descending colon   . Cervical high risk HPV (human papillomavirus) test positive 03/22/2015  . Benign essential HTN 01/08/2015  . Insomnia, persistent 01/08/2015  . COPD, mild ([place]) 01/08/2015  . HLD (hyperlipidemia) 01/08/2015  . Dysmetabolic syndrome 68/34/1962  . Severe obesity (BMI 35.0-35.9 with comorbidity) ([place]) 01/08/2015  . Seasonal allergic rhinitis 01/08/2015  . Neuropathy due to chemotherapeutic drug ([place]) 01/08/2015  . Arthralgia of multiple joints 01/08/2015    Past Surgical History:  Procedure Laterality Date  . BLADDER SURGERY  2007  . BREAST BIOPSY Left 2013   invasive mammary carcinoma  . BREAST LUMPECTOMY Left 2013   invasive mammary carcinoma with 2 positive lymph nodes. Margins were clear.   Marland Kitchen BREAST SURGERY    . COLONOSCOPY WITH PROPOFOL N/A 04/04/2015   Procedure: COLONOSCOPY WITH PROPOFOL;  Surgeon: [name], MD;  Location: [place];  Service: Endoscopy;  Laterality: N/A;  . POLYPECTOMY  04/04/2015   Procedure: POLYPECTOMY;  Surgeon: [name], MD;  Location: [place];  Service: Endoscopy;;    Family History  Problem Relation Age of Onset  . Heart disease Mother   . Diabetes Father   . Hypertension Father     Social History   Socioeconomic History  . Marital status: Single    Spouse name: Not on file  . Number of children: 1  . Years of education: Not on file  . Highest education level: 12th grade  Occupational History  . Occupation: Radio producer.   Tobacco Use  . Smoking status: Current Every Day Smoker    Packs/day: 0.75    Years: 36.00    Pack years: 27.00    Types: Cigarettes    Start date: 06/27/1981  . Smokeless tobacco: Never Used  Vaping Use  . Vaping Use: Never used  Substance and  Sexual Activity  . Alcohol use: No    Alcohol/week: 0.0 standard drinks  . Drug use: No  . Sexual activity: Not Currently    Partners: Male  Other Topics Concern  . Not on file  Social History Narrative   She has one son, still lives at home, he helps her out financially also.    Social Determinants of Health   Financial Resource Strain: Low Risk   . Difficulty  of Paying Living Expenses: Not hard at all  Food Insecurity: No Food Insecurity  . Worried About Charity fundraiser in the Last Year: Never true  . Ran Out of Food in the Last Year: Never true  Transportation Needs: No Transportation Needs  . Lack of Transportation (Medical): No  . Lack of Transportation (Non-Medical): No  Physical Activity: Inactive  . Days of Exercise per Week: 0 days  . Minutes of Exercise per Session: 0 min  Stress: No Stress Concern Present  . Feeling of Stress : Not at all  Social Connections: Moderately Isolated  . Frequency of Communication with Friends and Family: More than three times a week  . Frequency of Social Gatherings with Friends and Family: Three times a week  . Attends Religious Services: More than 4 times per year  . Active Member of Clubs or Organizations: No  . Attends Archivist Meetings: Never  . Marital Status: Never married  Intimate Partner Violence: Not At Risk  . Fear of Current or Ex-Partner: No  . Emotionally Abused: No  . Physically Abused: No  . Sexually Abused: No     Current Outpatient Medications:  .  Cyanocobalamin (VITAMIN B-12) 1000 MCG SUBL, Place 1 tablet (1,000 mcg total) under the tongue 3 (three) times a week., Disp: 100 tablet, Rfl: 0 .  cyclobenzaprine (FLEXERIL) 10 MG tablet, TAKE 0.5-1 TABLETS (5-10 MG TOTAL) BY MOUTH AT BEDTIME., Disp: 90 tablet, Rfl: 0 .  fluticasone (FLONASE) 50 MCG/ACT nasal spray, PLACE 2 SPRAYS INTO BOTH NOSTRILS AS NEEDED., Disp: 48 mL, Rfl: 1 .  gabapentin (NEURONTIN) 300 MG capsule, Take 1 capsule (300 mg total)  by mouth daily., Disp: 90 capsule, Rfl: 1 .  loratadine (CLARITIN) 10 MG tablet, Take 1 tablet (10 mg total) by mouth as needed for allergies., Disp: 30 tablet, Rfl: 2 .  losartan-hydrochlorothiazide (HYZAAR) 100-12.5 MG tablet, Take 1 tablet by mouth daily. NEW DOSE, Disp: 90 tablet, Rfl: 0 .  omeprazole (PRILOSEC) 40 MG capsule, Take 1 capsule (40 mg total) by mouth daily., Disp: 90 capsule, Rfl: 1 .  umeclidinium-vilanterol (ANORO ELLIPTA) 62.5-25 MCG/INH AEPB, Inhale 1 puff into the lungs daily., Disp: 60 each, Rfl: 5 .  Vitamin D, Ergocalciferol, (DRISDOL) 1.25 MG (50000 UNIT) CAPS capsule, Take 1 capsule (50,000 Units total) by mouth every 7 (seven) days., Disp: 12 capsule, Rfl: 0 .  zolpidem (AMBIEN) 10 MG tablet, Take 1 tablet (10 mg total) by mouth at bedtime., Disp: 90 tablet, Rfl: 0  Allergies  Allergen Reactions  . Ace Inhibitors Cough     ROS  Constitutional: Negative for fever or weight change.  Respiratory: positive  for smokers cough but no  shortness of breath.   Cardiovascular: Negative for chest pain or palpitations.  Gastrointestinal: Negative for abdominal pain, no bowel changes.  Musculoskeletal: Negative for gait problem or joint swelling.  Skin: Negative for rash.  Neurological: Negative for dizziness or headache.  No other specific complaints in a complete review of systems (except as listed in HPI above).  Objective  Vitals:   07/14/20 0918  BP: 133/80  Pulse: 94  Resp: 16  Temp: 98 F (36.7 C)  TempSrc: Oral  SpO2: 98%  Weight: 217 lb 1.6 oz (98.5 kg)  Height: $Remove'5\' 3"'eWvwtKa$  (1.6 m)    Body mass index is 38.46 kg/m.  Physical Exam  Constitutional: Patient appears well-developed and well-nourished. No distress.  HENT: Head: Normocephalic and atraumatic. Ears: B TMs ok, no erythema or  effusion; Nose: Not done  Mouth/Throat:not done  Eyes: Conjunctivae and EOM are normal. Pupils are equal, round, and reactive to light. No scleral icterus.  Neck: Normal  range of motion. Neck supple. No JVD present. No thyromegaly present.  Cardiovascular: Normal rate, regular rhythm and normal heart sounds.  No murmur heard. No BLE edema. Pulmonary/Chest: Effort normal and breath sounds normal. No respiratory distress. Abdominal: Soft. Bowel sounds are normal, no distension. There is no tenderness. no masses Breast: thick scarring on area of breast surgery , no lumps or masses ortherwise FEMALE GENITALIA:  Not done  RECTAL: not done  Musculoskeletal: Normal range of motion, no joint effusions. No gross deformities Neurological: he is alert and oriented to person, place, and time. No cranial nerve deficit. Coordination, balance, strength, speech and gait are normal.  Skin: Skin is warm and dry. No rash noted. No erythema.  Psychiatric: Patient has a normal mood and affect. behavior is normal. Judgment and thought content normal.  Fall Risk: Fall Risk  07/14/2020 06/10/2020 12/09/2019 06/24/2019 03/24/2019  Falls in the past year? 0 0 0 0 0  Number falls in past yr: 0 0 0 0 0  Injury with Fall? 0 0 0 0 0     Functional Status Survey: Is the patient deaf or have difficulty hearing?: No Does the patient have difficulty seeing, even when wearing glasses/contacts?: No Does the patient have difficulty concentrating, remembering, or making decisions?: No Does the patient have difficulty walking or climbing stairs?: No Does the patient have difficulty dressing or bathing?: No   Assessment & Plan  1. Well adult exam  Discussed lung cancer screening, she wants to hold off for now Colonoscopy scheduled for next week Advised to quit smoking Discussed covid -19 vaccine   2. Need for shingles vaccine  - Varicella-zoster vaccine IM  -USPSTF grade A and B recommendations reviewed with patient; age-appropriate recommendations, preventive care, screening tests, etc discussed and encouraged; healthy living encouraged; see AVS for patient education given to  patient -Discussed importance of 150 minutes of physical activity weekly, eat two servings of fish weekly, eat one serving of tree nuts ( cashews, pistachios, pecans, almonds.Marland Kitchen) every other day, eat 6 servings of fruit/vegetables daily and drink plenty of water and avoid sweet beverages.

## 2020-07-14 ENCOUNTER — Encounter: Payer: Self-pay | Admitting: Family Medicine

## 2020-07-14 ENCOUNTER — Ambulatory Visit (INDEPENDENT_AMBULATORY_CARE_PROVIDER_SITE_OTHER): Payer: BC Managed Care – PPO | Admitting: Family Medicine

## 2020-07-14 ENCOUNTER — Other Ambulatory Visit: Payer: Self-pay

## 2020-07-14 VITALS — BP 133/80 | HR 94 | Temp 98.0°F | Resp 16 | Ht 63.0 in | Wt 217.1 lb

## 2020-07-14 DIAGNOSIS — Z0000 Encounter for general adult medical examination without abnormal findings: Secondary | ICD-10-CM

## 2020-07-14 DIAGNOSIS — Z23 Encounter for immunization: Secondary | ICD-10-CM

## 2020-07-15 ENCOUNTER — Other Ambulatory Visit
Admission: RE | Admit: 2020-07-15 | Discharge: 2020-07-15 | Disposition: A | Discharge status: Home / Self Care | Payer: BC Managed Care – PPO | Source: Ambulatory Visit | Attending: Gastroenterology | Admitting: Gastroenterology

## 2020-07-15 DIAGNOSIS — Z01812 Encounter for preprocedural laboratory examination: Secondary | ICD-10-CM | POA: Diagnosis not present

## 2020-07-15 DIAGNOSIS — Z20822 Contact with and (suspected) exposure to covid-19: Secondary | ICD-10-CM | POA: Insufficient documentation

## 2020-07-15 LAB — SARS CORONAVIRUS 2 (TAT 6-24 HRS): SARS Coronavirus 2: NEGATIVE

## 2020-07-19 ENCOUNTER — Other Ambulatory Visit: Payer: Self-pay

## 2020-07-19 ENCOUNTER — Ambulatory Visit
Admission: RE | Admit: 2020-07-19 | Discharge: 2020-07-19 | Disposition: A | Discharge status: Home / Self Care | Payer: BC Managed Care – PPO | Attending: Gastroenterology | Admitting: Gastroenterology

## 2020-07-19 ENCOUNTER — Encounter
Admission: RE | Disposition: A | Discharge status: Home / Self Care | Payer: Self-pay | Source: Home / Self Care | Attending: Gastroenterology

## 2020-07-19 ENCOUNTER — Encounter: Payer: Self-pay | Admitting: Gastroenterology

## 2020-07-19 ENCOUNTER — Encounter: Payer: Self-pay | Admitting: Certified Registered"

## 2020-07-19 DIAGNOSIS — K5730 Diverticulosis of large intestine without perforation or abscess without bleeding: Secondary | ICD-10-CM | POA: Diagnosis not present

## 2020-07-19 DIAGNOSIS — D122 Benign neoplasm of ascending colon: Secondary | ICD-10-CM | POA: Diagnosis not present

## 2020-07-19 DIAGNOSIS — K635 Polyp of colon: Secondary | ICD-10-CM | POA: Diagnosis not present

## 2020-07-19 DIAGNOSIS — F17210 Nicotine dependence, cigarettes, uncomplicated: Secondary | ICD-10-CM | POA: Diagnosis not present

## 2020-07-19 DIAGNOSIS — D123 Benign neoplasm of transverse colon: Secondary | ICD-10-CM | POA: Diagnosis not present

## 2020-07-19 DIAGNOSIS — K641 Second degree hemorrhoids: Secondary | ICD-10-CM | POA: Insufficient documentation

## 2020-07-19 DIAGNOSIS — Z7951 Long term (current) use of inhaled steroids: Secondary | ICD-10-CM | POA: Diagnosis not present

## 2020-07-19 DIAGNOSIS — Z1211 Encounter for screening for malignant neoplasm of colon: Secondary | ICD-10-CM | POA: Insufficient documentation

## 2020-07-19 DIAGNOSIS — Z860100 Personal history of colon polyps, unspecified: Secondary | ICD-10-CM

## 2020-07-19 DIAGNOSIS — D125 Benign neoplasm of sigmoid colon: Secondary | ICD-10-CM | POA: Insufficient documentation

## 2020-07-19 DIAGNOSIS — Z79899 Other long term (current) drug therapy: Secondary | ICD-10-CM | POA: Diagnosis not present

## 2020-07-19 HISTORY — PX: COLONOSCOPY WITH PROPOFOL: SHX5780

## 2020-07-19 SURGERY — COLONOSCOPY WITH PROPOFOL
Anesthesia: General

## 2020-07-19 MED ORDER — LIDOCAINE HCL (CARDIAC) PF 100 MG/5ML IV SOSY
PREFILLED_SYRINGE | INTRAVENOUS | Status: DC | PRN
  Administered 2020-07-19: 100 mg via INTRAVENOUS

## 2020-07-19 MED ORDER — PROPOFOL 10 MG/ML IV BOLUS
INTRAVENOUS | Status: DC | PRN
  Administered 2020-07-19: 40 mg via INTRAVENOUS
  Administered 2020-07-19: 10 mg via INTRAVENOUS

## 2020-07-19 MED ORDER — SODIUM CHLORIDE 0.9 % IV SOLN: INTRAVENOUS | Status: DC

## 2020-07-19 MED ORDER — GLYCOPYRROLATE 0.2 MG/ML IJ SOLN
INTRAMUSCULAR | Status: DC | PRN
  Administered 2020-07-19: .2 mg via INTRAVENOUS

## 2020-07-19 MED ORDER — PROPOFOL 500 MG/50ML IV EMUL
INTRAVENOUS | Status: DC | PRN
  Administered 2020-07-19: 165 ug/kg/min via INTRAVENOUS

## 2020-07-19 NOTE — Anesthesia Postprocedure Evaluation (Signed)
Anesthesia Post Note  Patient: Brenda Jones  Procedure(s) Performed: COLONOSCOPY WITH PROPOFOL (N/A )  Patient location during evaluation: Endoscopy Anesthesia Type: General Level of consciousness: awake and alert Pain management: pain level controlled Vital Signs Assessment: post-procedure vital signs reviewed and stable Respiratory status: spontaneous breathing and respiratory function stable Cardiovascular status: stable Anesthetic complications: no   No complications documented.   Last Vitals:  Vitals:   07/19/20 0930 07/19/20 0940  BP: 131/75 139/79  Pulse: (!) 108 91  Resp: 20 (!) 27  Temp:    SpO2: 100% 100%    Last Pain:  Vitals:   07/19/20 0831  TempSrc: Temporal  PainSc: 0-No pain                 Vasti Yagi K

## 2020-07-19 NOTE — Anesthesia Procedure Notes (Signed)
Procedure Name: General with mask airway Performed by: Fletcher-Harrison, Ajiah Mcglinn, CRNA Pre-anesthesia Checklist: Patient identified, Emergency Drugs available, Suction available and Patient being monitored Patient Re-evaluated:Patient Re-evaluated prior to induction Oxygen Delivery Method: Simple face mask Induction Type: IV induction Placement Confirmation: positive ETCO2 and CO2 detector Dental Injury: Teeth and Oropharynx as per pre-operative assessment        

## 2020-07-19 NOTE — Anesthesia Preprocedure Evaluation (Signed)
Anesthesia Evaluation  Patient identified by MRN, date of birth, ID band Patient awake    Reviewed: Allergy & Precautions, NPO status , Patient's Chart, lab work & pertinent test results  History of Anesthesia Complications Negative for: history of anesthetic complications  Airway Mallampati: III       Dental  (+) Missing, Chipped   Pulmonary neg sleep apnea, COPD, Current Smoker,           Cardiovascular hypertension, Pt. on medications (-) Past MI and (-) CHF (-) dysrhythmias (-) Valvular Problems/Murmurs     Neuro/Psych neg Seizures    GI/Hepatic Neg liver ROS, GERD  Medicated and Controlled,  Endo/Other  neg diabetes  Renal/GU negative Renal ROS     Musculoskeletal   Abdominal   Peds  Hematology   Anesthesia Other Findings   Reproductive/Obstetrics                             Anesthesia Physical Anesthesia Plan  ASA: II  Anesthesia Plan: General   Post-op Pain Management:    Induction: Intravenous  PONV Risk Score and Plan: 2 and Propofol infusion and TIVA  Airway Management Planned: Nasal Cannula  Additional Equipment:   Intra-op Plan:   Post-operative Plan:   Informed Consent: I have reviewed the patients History and Physical, chart, labs and discussed the procedure including the risks, benefits and alternatives for the proposed anesthesia with the patient or authorized representative who has indicated his/her understanding and acceptance.       Plan Discussed with:   Anesthesia Plan Comments:         Anesthesia Quick Evaluation

## 2020-07-19 NOTE — Transfer of Care (Signed)
Immediate Anesthesia Transfer of Care Note  Patient: Brenda Jones  Procedure(s) Performed: COLONOSCOPY WITH PROPOFOL (N/A )  Patient Location: Endoscopy Unit  Anesthesia Type:General  Level of Consciousness: drowsy and patient cooperative  Airway & Oxygen Therapy: Patient Spontanous Breathing and Patient connected to face mask oxygen  Post-op Assessment: Report given to RN and Post -op Vital signs reviewed and stable  Post vital signs: Reviewed and stable  Last Vitals:  Vitals Value Taken Time  BP 131/75 07/19/20 0930  Temp    Pulse 108 07/19/20 0930  Resp 20 07/19/20 0930  SpO2 100 % 07/19/20 0930    Last Pain:  Vitals:   07/19/20 0831  TempSrc: Temporal  PainSc: 0-No pain         Complications: No complications documented.

## 2020-07-19 NOTE — Op Note (Signed)
[facility] Gastroenterology Patient Name: Brenda Jones Procedure Date: 07/19/2020 9:09 AM MRN: [id number] Account #: [id number] Date of Birth: [birth year] Admit Type: Outpatient Age: 54 Room: [facility] ENDO ROOM 4 Gender: Female Note Status: Finalized Procedure:             Colonoscopy Indications:           High risk colon cancer surveillance: Personal history                         of colonic polyps Providers:             [name] MD, MD Referring MD:          Bethena Roys. Sowles, MD (Referring MD) Medicines:             Propofol per Anesthesia Complications:         No immediate complications. Procedure:             Pre-Anesthesia Assessment:                        - Prior to the procedure, a History and Physical was                         performed, and patient medications and allergies were                         reviewed. The patient's tolerance of previous                         anesthesia was also reviewed. The risks and benefits                         of the procedure and the sedation options and risks                         were discussed with the patient. All questions were                         answered, and informed consent was obtained. Prior                         Anticoagulants: The patient has taken no previous                         anticoagulant or antiplatelet agents. ASA Grade                         Assessment: II - A patient with mild systemic disease.                         After reviewing the risks and benefits, the patient                         was deemed in satisfactory condition to undergo the                         procedure.  After obtaining informed consent, the colonoscope was                         passed under direct vision. Throughout the procedure,                         the patient's blood pressure, pulse, and oxygen                         saturations were monitored continuously. The                          Colonoscope was introduced through the anus and                         advanced to the the cecum, identified by appendiceal                         orifice and ileocecal valve. The colonoscopy was                         performed without difficulty. The patient tolerated                         the procedure well. The quality of the bowel                         preparation was excellent. Findings:      The perianal and digital rectal examinations were normal.      A 3 mm polyp was found in the ascending colon. The polyp was sessile.       The polyp was removed with a cold biopsy forceps. Resection and       retrieval were complete.      Four sessile polyps were found in the transverse colon. The polyps were       3 to 4 mm in size. These polyps were removed with a cold biopsy forceps.       Resection and retrieval were complete.      A 6 mm polyp was found in the sigmoid colon. The polyp was pedunculated.       The polyp was removed with a cold snare. Resection and retrieval were       complete.      Many small-mouthed diverticula were found in the entire colon.      Non-bleeding internal hemorrhoids were found during retroflexion. The       hemorrhoids were Grade II (internal hemorrhoids that prolapse but reduce       spontaneously). Impression:            - One 3 mm polyp in the ascending colon, removed with                         a cold biopsy forceps. Resected and retrieved.                        - Four 3 to 4 mm polyps in the transverse colon,                         removed with a cold  biopsy forceps. Resected and                         retrieved.                        - One 6 mm polyp in the sigmoid colon, removed with a                         cold snare. Resected and retrieved.                        - Diverticulosis in the entire examined colon.                        - Non-bleeding internal hemorrhoids. Recommendation:        - Discharge patient to  home.                        - Resume previous diet.                        - Continue present medications.                        - Await pathology results.                        - Repeat colonoscopy in 5 years for surveillance. Procedure Code(s):     --- Professional ---                        [phone], Colonoscopy, flexible; with removal of                         tumor(s), polyp(s), or other lesion(s) by snare                         technique                        45380, 26, Colonoscopy, flexible; with biopsy, single                         or multiple Diagnosis Code(s):     --- Professional ---                        Z86.010, Personal history of colonic polyps                        K63.5, Polyp of colon CPT copyright 2019 American Medical Association. All rights reserved. The codes documented in this report are preliminary and upon coder review may  be revised to meet current compliance requirements. [name] MD, MD 07/19/2020 9:27:07 AM This report has been signed electronically. Number of Addenda: 0 Note Initiated On: 07/19/2020 9:09 AM Scope Withdrawal Time: 0 hours 6 minutes 36 seconds  Total Procedure Duration: 0 hours 10 minutes 19 seconds  Estimated Blood Loss:  Estimated blood loss: none.      [facility]

## 2020-07-19 NOTE — H&P (Signed)
[name], MD [address] [place], [place] [address] Phone:[phone] Fax : [phone]  Primary Care Physician:  Steele Sizer, MD Primary Gastroenterologist:  Dr. Allen Norris  Pre-Procedure History & Physical: HPI:  Brenda Jones is a 54 y.o. female is here for an colonoscopy.   Past Medical History:  Diagnosis Date  . Allergy   . Arthritis   . Breast CA ([place])   . Breast cancer ([place]) 2013   left breast lumpectomy with chemo and rad tx  . COPD (chronic obstructive pulmonary disease) ([place])   . Headache    occasional - thinks it is from BP meds  . Hypertension   . Insomnia   . Metabolic syndrome   . Obesity   . Peripheral neuropathy   . Personal history of chemotherapy   . Personal history of radiation therapy     Past Surgical History:  Procedure Laterality Date  . BLADDER SURGERY  2007  . BREAST BIOPSY Left 2013   invasive mammary carcinoma  . BREAST LUMPECTOMY Left 2013   invasive mammary carcinoma with 2 positive lymph nodes. Margins were clear.   Marland Kitchen BREAST SURGERY    . COLONOSCOPY WITH PROPOFOL N/A 04/04/2015   Procedure: COLONOSCOPY WITH PROPOFOL;  Surgeon: [name], MD;  Location: [facility];  Service: Endoscopy;  Laterality: N/A;  . POLYPECTOMY  04/04/2015   Procedure: POLYPECTOMY;  Surgeon: [name], MD;  Location: [facility];  Service: Endoscopy;;    Prior to Admission medications   Medication Sig Start Date End Date Taking? Authorizing Provider  Cyanocobalamin (VITAMIN B-12) 1000 MCG SUBL Place 1 tablet (1,000 mcg total) under the tongue 3 (three) times a week. 12/14/19  Yes Sowles, Drue Stager, MD  cyclobenzaprine (FLEXERIL) 10 MG tablet TAKE 0.5-1 TABLETS (5-10 MG TOTAL) BY MOUTH AT BEDTIME. 05/12/20  Yes Sowles, Drue Stager, MD  fluticasone (FLONASE) 50 MCG/ACT nasal spray PLACE 2 SPRAYS INTO BOTH NOSTRILS AS NEEDED. 07/08/19  Yes Sowles, Drue Stager, MD  gabapentin (NEURONTIN) 300 MG capsule Take 1 capsule (300 mg total) by  mouth daily. 06/10/20  Yes Sowles, Drue Stager, MD  loratadine (CLARITIN) 10 MG tablet Take 1 tablet (10 mg total) by mouth as needed for allergies. 04/18/15  Yes Sowles, Drue Stager, MD  losartan-hydrochlorothiazide (HYZAAR) 100-12.5 MG tablet Take 1 tablet by mouth daily. NEW DOSE 06/10/20  Yes Sowles, Drue Stager, MD  omeprazole (PRILOSEC) 40 MG capsule Take 1 capsule (40 mg total) by mouth daily. 06/10/20  Yes Sowles, Drue Stager, MD  umeclidinium-vilanterol (ANORO ELLIPTA) 62.5-25 MCG/INH AEPB Inhale 1 puff into the lungs daily. 12/09/19  Yes Sowles, Drue Stager, MD  Vitamin D, Ergocalciferol, (DRISDOL) 1.25 MG (50000 UNIT) CAPS capsule Take 1 capsule (50,000 Units total) by mouth every 7 (seven) days. 06/10/20  Yes Sowles, Drue Stager, MD  zolpidem (AMBIEN) 10 MG tablet Take 1 tablet (10 mg total) by mouth at bedtime. 06/10/20  Yes Steele Sizer, MD    Allergies as of 06/20/2020 - Review Complete 06/20/2020  Allergen Reaction Noted  . Ace inhibitors Cough 01/07/2015    Family History  Problem Relation Age of Onset  . Heart disease Mother   . Diabetes Father   . Hypertension Father     Social History   Socioeconomic History  . Marital status: Single    Spouse name: Not on file  . Number of children: 1  . Years of education: Not on file  . Highest education level: 12th grade  Occupational History  . Occupation: Radio producer.   Tobacco Use  .  Smoking status: Current Every Day Smoker    Packs/day: 0.50    Years: 36.00    Pack years: 18.00    Types: Cigarettes    Start date: 06/27/1981  . Smokeless tobacco: Never Used  Vaping Use  . Vaping Use: Never used  Substance and Sexual Activity  . Alcohol use: No    Alcohol/week: 0.0 standard drinks  . Drug use: No  . Sexual activity: Not Currently    Partners: Male  Other Topics Concern  . Not on file  Social History Narrative   She has one son, still lives at home, he helps her out financially also.    Social Determinants of Health    Financial Resource Strain: Low Risk   . Difficulty of Paying Living Expenses: Not hard at all  Food Insecurity: No Food Insecurity  . Worried About Programme researcher, broadcasting/film/video in the Last Year: Never true  . Ran Out of Food in the Last Year: Never true  Transportation Needs: No Transportation Needs  . Lack of Transportation (Medical): No  . Lack of Transportation (Non-Medical): No  Physical Activity: Inactive  . Days of Exercise per Week: 0 days  . Minutes of Exercise per Session: 0 min  Stress: No Stress Concern Present  . Feeling of Stress : Not at all  Social Connections: Moderately Isolated  . Frequency of Communication with Friends and Family: More than three times a week  . Frequency of Social Gatherings with Friends and Family: Three times a week  . Attends Religious Services: More than 4 times per year  . Active Member of Clubs or Organizations: No  . Attends Banker Meetings: Never  . Marital Status: Never married  Intimate Partner Violence: Not At Risk  . Fear of Current or Ex-Partner: No  . Emotionally Abused: No  . Physically Abused: No  . Sexually Abused: No    Review of Systems: See HPI, otherwise negative ROS  Physical Exam: BP 131/75   Pulse (!) 108   Temp 98.2 F (36.8 C) (Temporal)   Resp 20   Ht 5\' 4"  (1.626 m)   Wt 98.9 kg   SpO2 100%   BMI 37.42 kg/m  General:   Alert,  pleasant and cooperative in NAD Head:  Normocephalic and atraumatic. Neck:  Supple; no masses or thyromegaly. Lungs:  Clear throughout to auscultation.    Heart:  Regular rate and rhythm. Abdomen:  Soft, nontender and nondistended. Normal bowel sounds, without guarding, and without rebound.   Neurologic:  Alert and  oriented x4;  grossly normal neurologically.  Impression/Plan: Coren Crownover is here for an colonoscopy to be performed for a history of adenomatous polyps in 2016   Risks, benefits, limitations, and alternatives regarding  colonoscopy have been reviewed  with the patient.  Questions have been answered.  All parties agreeable.   2017, MD  07/19/2020, 9:31 AM

## 2020-07-20 ENCOUNTER — Encounter: Payer: Self-pay | Admitting: Gastroenterology

## 2020-07-20 LAB — SURGICAL PATHOLOGY

## 2020-07-21 ENCOUNTER — Encounter: Payer: Self-pay | Admitting: Gastroenterology

## 2020-08-29 ENCOUNTER — Ambulatory Visit: Payer: BC Managed Care – PPO | Admitting: Oncology

## 2020-09-13 NOTE — Progress Notes (Signed)
Name: Brenda Jones   MRN: [id number]    DOB: Jul 24, 1965   Date:09/14/2020       Progress Note  Subjective  Chief Complaint  Follow Up  HPI  HTN: taking bp medication daily,now on losartan hctz, we decreased dose of diuretic when we added losartan but did not improve cramping,shecontinues to havecramping since chemo back in 10/20/2011. No chest pain,palpitation or dizziness,  she has chronic edema on both ankles that has been stable. She had cough with ACE but not with ARB. We adjusted her dose of losartan hctz last visit but she is still taking old rx. BP is borderline.   GERD: seen by Dr. Durwin Reges, history of h. Pylori, still taking Omeprazole, but still does not follow a GERD diet and has flares at times.   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 Grayland Ormond, currently taking vitamin D rx, she had repeat bone density im 02/2020 and it showed mild progression at spine level, femur has been stable.   Insomnia: medication helps her fall and stay asleep, but wakes up at 2 am to go to work. She has been getting about 5-6 hours of sleep now, becauseshe has been working 12 hours shifts. She tried going down on Ambien dose to 5 mg but could not sleep, she is aware of FDA recommendations.She denies any side effects of medication. She does not take cyclobenzaprine at the same time   COPD:She quit smoking for about one month but resumed after father died 10/19/2017, she is up to  1 pack a day now. She started smoking at age 43 , discussed importance of trying to quit again, pneumonia vaccines up to date, discussed COVID -45 vaccine with patient again and she states she will get it at local pharmacy, flu shot is up to date . She has a morning cough mostly dry but has clear sputum at times, no wheezing but has some SOB with activity. She is not using Anoro daily, costly, we will try Trelegy with a voucher  Muscles Spasms: got much worse after chemotherapy  - in 20-Oct-2011 and takes  Cyclobenzaprine to control symptoms, usually in the evening when she gets home from work . It can be on her trunk or legs, she has tried magnesium and co Q 10 without improvement of symptoms, therefore she stopped the supplements. She is aware to not take Ambien, Flexeril and gabapentin at the same time because of compound effects of sedation Symptoms have been stable for years.   History of breast cancer left:missed follow up withDr. Grayland Ormond, last visit was in Oct 19, 2016, reminded her to re-schedule it. She is s/p lumpectomyshe completed 5 years of Anastrozole 03/2017. Last  Mammogram wasdone 02/2020. She is back following up with Dr. Grayland Ormond   Peripheral Neuropathy due to chemotherapy : still has daily pain on both legs and feet also also tips of fingers. She is on Gabapentin but not taking it daily, Lyrica did not work for her, Gabapentin makes her sleepy, advised to take it in the evening when she arrives from work. She states pain is stable6-7/10 all the time . She has noticed a sharp pain from right great toe to mid lower leg intermittently, not daily, only lasts seconds and resolves by itself.   Morbid Obesity :BMI 35 with co-morbidities- OA, HTN,GERD, Metabolic syndrome. Discussed againimportance of life style modification. She is still not exercising and likes to eat. Unchanged .   Patient Active Problem List   Diagnosis Date Noted  . History of  colonic polyps   . Polyp of transverse colon   . Polyp of sigmoid colon   . History of breast cancer 06/24/2019  . Morbid obesity ([place]) 03/12/2018  . Osteoarthritis of right knee 08/29/2016  . Osteopenia due to cancer therapy 04/18/2015  . GERD (gastroesophageal reflux disease) 04/18/2015  . Muscle spasm 04/18/2015  . Benign neoplasm of descending colon   . Cervical high risk HPV (human papillomavirus) test positive 03/22/2015  . Benign essential HTN 01/08/2015  . Insomnia, persistent 01/08/2015  . COPD, mild ([place]) 01/08/2015  .  HLD (hyperlipidemia) 01/08/2015  . Dysmetabolic syndrome 01/20/1600  . Severe obesity (BMI 35.0-35.9 with comorbidity) ([place]) 01/08/2015  . Seasonal allergic rhinitis 01/08/2015  . Neuropathy due to chemotherapeutic drug ([place]) 01/08/2015  . Arthralgia of multiple joints 01/08/2015    Past Surgical History:  Procedure Laterality Date  . BLADDER SURGERY  2007  . BREAST BIOPSY Left 2013   invasive mammary carcinoma  . BREAST LUMPECTOMY Left 2013   invasive mammary carcinoma with 2 positive lymph nodes. Margins were clear.   Marland Kitchen BREAST SURGERY    . COLONOSCOPY WITH PROPOFOL N/A 04/04/2015   Procedure: COLONOSCOPY WITH PROPOFOL;  Surgeon: [name], MD;  Location: [facility];  Service: Endoscopy;  Laterality: N/A;  . COLONOSCOPY WITH PROPOFOL N/A 07/19/2020   Procedure: COLONOSCOPY WITH PROPOFOL;  Surgeon: [name], MD;  [facility];  Service: Endoscopy;  Laterality: N/A;  . POLYPECTOMY  04/04/2015   Procedure: POLYPECTOMY;  Surgeon: [name], MD;  Location: [facility];  Service: Endoscopy;;    Family History  Problem Relation Age of Onset  . Heart disease Mother   . Diabetes Father   . Hypertension Father     Social History   Tobacco Use  . Smoking status: Current Every Day Smoker    Packs/day: 0.50    Years: 36.00    Pack years: 18.00    Types: Cigarettes    Start date: 06/27/1981  . Smokeless tobacco: Never Used  Substance Use Topics  . Alcohol use: No    Alcohol/week: 0.0 standard drinks     Current Outpatient Medications:  .  Cyanocobalamin (VITAMIN B-12) 1000 MCG SUBL, Place 1 tablet (1,000 mcg total) under the tongue 3 (three) times a week., Disp: 100 tablet, Rfl: 0 .  fluticasone (FLONASE) 50 MCG/ACT nasal spray, PLACE 2 SPRAYS INTO BOTH NOSTRILS AS NEEDED., Disp: 48 mL, Rfl: 1 .  Fluticasone-Umeclidin-Vilant (TRELEGY ELLIPTA) 100-62.5-25 MCG/INH AEPB, Inhale 1 puff into the lungs daily., Disp: 60 each, Rfl: 5 .  gabapentin  (NEURONTIN) 300 MG capsule, Take 1 capsule (300 mg total) by mouth daily., Disp: 90 capsule, Rfl: 1 .  loratadine (CLARITIN) 10 MG tablet, Take 1 tablet (10 mg total) by mouth as needed for allergies., Disp: 30 tablet, Rfl: 2 .  losartan-hydrochlorothiazide (HYZAAR) 100-12.5 MG tablet, Take 1 tablet by mouth daily. NEW DOSE, Disp: 90 tablet, Rfl: 0 .  omeprazole (PRILOSEC) 40 MG capsule, Take 1 capsule (40 mg total) by mouth daily., Disp: 90 capsule, Rfl: 1 .  cyclobenzaprine (FLEXERIL) 10 MG tablet, Take 0.5-1 tablets (5-10 mg total) by mouth at bedtime., Disp: 90 tablet, Rfl: 0 .  Vitamin D, Ergocalciferol, (DRISDOL) 1.25 MG (50000 UNIT) CAPS capsule, Take 1 capsule (50,000 Units total) by mouth every 7 (seven) days., Disp: 12 capsule, Rfl: 0 .  zolpidem (AMBIEN) 10 MG tablet, Take 1 tablet (10 mg total) by mouth at bedtime., Disp: 90 tablet, Rfl: 0  Allergies  Allergen Reactions  . Ace Inhibitors Cough    I personally reviewed active problem list, medication list, allergies, family history, social history, health maintenance with the patient/caregiver today.   ROS  Constitutional: Negative for fever or weight change.  Respiratory: Negative for cough and shortness of breath.   Cardiovascular: Negative for chest pain or palpitations.  Gastrointestinal: Negative for abdominal pain, no bowel changes.  Musculoskeletal: Negative for gait problem or joint swelling.  Skin: Negative for rash.  Neurological: Negative for dizziness or headache.  No other specific complaints in a complete review of systems (except as listed in HPI above).  Objective  Vitals:   09/14/20 0922  BP: 136/88  Pulse: 85  Resp: 16  Temp: 98.2 F (36.8 C)  TempSrc: Oral  SpO2: 99%  Weight: 218 lb (98.9 kg)  Height: 5\' 3"  (1.6 m)    Body mass index is 38.62 kg/m.  Physical Exam  Constitutional: Patient appears well-developed and well-nourished. Obese  No distress.  HEENT: head atraumatic, normocephalic,  pupils equal and reactive to light, neck supple Cardiovascular: Normal rate, regular rhythm and normal heart sounds.  No murmur heard. No BLE edema. Pulmonary/Chest: Effort normal and breath sounds normal. No respiratory distress. Abdominal: Soft.  There is no tenderness. Psychiatric: Patient has a normal mood and affect. behavior is normal. Judgment and thought content normal.  Recent Results (from the past 2160 hour(s))  SARS CORONAVIRUS 2 (TAT 6-24 HRS) Nasopharyngeal Nasopharyngeal Swab     Status: None   Collection Time: 07/15/20  9:11 AM   Specimen: Nasopharyngeal Swab  Result Value Ref Range   SARS Coronavirus 2 NEGATIVE NEGATIVE    Comment: (NOTE) SARS-CoV-2 target nucleic acids are NOT DETECTED.  The SARS-CoV-2 RNA is generally detectable in upper and lower respiratory specimens during the acute phase of infection. Negative results do not preclude SARS-CoV-2 infection, do not rule out co-infections with other pathogens, and should not be used as the sole basis for treatment or other patient management decisions. Negative results must be combined with clinical observations, patient history, and epidemiological information. The expected result is Negative.  Fact Sheet for Patients: SugarRoll.be  Fact Sheet for Healthcare Providers: https://www.woods-mathews.com/  This test is not yet approved or cleared by the [place] FDA and  has been authorized for detection and/or diagnosis of SARS-CoV-2 by FDA under an Emergency Use Authorization (EUA). This EUA will remain  in effect (meaning this test can be used) for the duration of the COVID-19 declaration under Se ction 564(b)(1) of the Act, 21 U.S.C. section 360bbb-3(b)(1), unless the authorization is terminated or revoked sooner.  Performed at [facility], [place] [address], [place], [place] [address]   Surgical pathology     Status: None   Collection Time: 07/19/20  9:16 AM   Result Value Ref Range   SURGICAL PATHOLOGY      SURGICAL PATHOLOGY CASE: [id number] PATIENT: [name] Surgical Pathology Report     Specimen Submitted: A. Colon polyps x4, transverse; cbx B. Colon polyp x1, ascending; cbx C. Colon polyp x1, sigmoid; cs  Clinical History: Personal history of colon polyps      DIAGNOSIS: A. COLON POLYPS X4, TRANSVERSE; COLD BIOPSY: - TUBULAR ADENOMA, MULTIPLE FRAGMENTS. - NEGATIVE FOR HIGH-GRADE DYSPLASIA AND MALIGNANCY.  B. COLON POLYP X1, ASCENDING; COLD BIOPSY: - TUBULAR ADENOMA. - NEGATIVE FOR HIGH-GRADE DYSPLASIA AND MALIGNANCY.  C. COLON POLYP X1, SIGMOID; COLD SNARE: - TUBULAR ADENOMA. - NEGATIVE FOR HIGH-GRADE DYSPLASIA AND MALIGNANCY   GROSS DESCRIPTION:  A. Labeled: cbx transverse colon polyps x4 Received: Formalin Tissue fragment(s): Multiple Size: Aggregate, 1.2 x 0.5 x 0.2 cm Description: Tan soft tissue fragments Entirely submitted in 1 cassette.  B. Labeled: cbx ascending colon polyp x1 Received: Formalin Tissue fragment(s): Multiple S ize: Aggregate, 1 x 0.5 x 0.2 cm Description: Tan soft tissue fragments Entirely submitted in 1 cassette.  C. Labeled: Cold snare sigmoid colon polyp x1 Received: Formalin Tissue fragment(s): 3 Size: Aggregate, 1.3 x 0.7 x 0.3 cm Description: Tan soft tissue fragments Entirely submitted in 1 cassette.   Final Diagnosis performed by Betsy Pries, MD.   Electronically signed 07/20/2020 9:28:36AM The electronic signature indicates that the named Attending Pathologist has evaluated the specimen Technical component performed at [facility], [address] [place], [place] [address] Lab: [phone] Dir: Rush Farmer, MD, MMM  Professional component performed at [facility], [facility], [place], [place],  [address] Lab: [phone] Dir: Dellia Nims. Rubinas, MD     PHQ2/9: Depression screen [facility] 2/9 09/14/2020 07/14/2020 06/10/2020  12/09/2019 06/24/2019  Decreased Interest 0 0 0 0 0  Down, Depressed, Hopeless 0 0 0 0 0  PHQ - 2 Score 0 0 0 0 0  Altered sleeping - 3 3 0 0  Tired, decreased energy - 0 1 0 0  Change in appetite - 0 0 0 0  Feeling bad or failure about yourself  - 0 0 0 0  Trouble concentrating - 0 0 0 0  Moving slowly or fidgety/restless - 0 0 0 0  Suicidal thoughts - 0 0 0 0  PHQ-9 Score - 3 4 0 0  Difficult doing work/chores - - Not difficult at all - -  Some recent data might be hidden    phq 9 is negative   Fall Risk: Fall Risk  09/14/2020 07/14/2020 06/10/2020 12/09/2019 06/24/2019  Falls in the past year? 0 0 0 0 0  Number falls in past yr: 0 0 0 0 0  Injury with Fall? 0 0 0 0 0     Functional Status Survey: Is the patient deaf or have difficulty hearing?: No Does the patient have difficulty seeing, even when wearing glasses/contacts?: No Does the patient have difficulty concentrating, remembering, or making decisions?: No Does the patient have difficulty walking or climbing stairs?: No Does the patient have difficulty dressing or bathing?: No Does the patient have difficulty doing errands alone such as visiting a doctor's office or shopping?: No    Assessment & Plan  1. Benign essential HTN  Needs to start new dose of losartan hctz  2. Morbid obesity ([place])  Discussed with the patient the risk posed by an increased BMI. Discussed importance of portion control, calorie counting and at least 150 minutes of physical activity weekly. Avoid sweet beverages and drink more water. Eat at least 6 servings of fruit and vegetables daily   3. COPD, mild ([place])  Discussed tobacco cessation  - Fluticasone-Umeclidin-Vilant (TRELEGY ELLIPTA) 100-62.5-25 MCG/INH AEPB; Inhale 1 puff into the lungs daily.  Dispense: 60 each; Refill: 5  4. Vitamin D deficiency  - Vitamin D, Ergocalciferol, (DRISDOL) 1.25 MG (50000 UNIT) CAPS capsule; Take 1 capsule (50,000 Units total) by mouth every 7 (seven)  days.  Dispense: 12 capsule; Refill: 0  5. Insomnia, persistent  - zolpidem (AMBIEN) 10 MG tablet; Take 1 tablet (10 mg total) by mouth at bedtime.  Dispense: 90 tablet; Refill: 0  6. Peripheral neuropathy due to chemotherapy ([facility])  Stable  7. Dyslipidemia  8. Gastroesophageal reflux disease without esophagitis  Needs to change her diet   9. Low vitamin B12 level   10. Seasonal allergic rhinitis, unspecified trigger   11. Dysmetabolic syndrome   12. Osteopenia determined by x-ray   13. Muscle spasm  - cyclobenzaprine (FLEXERIL) 10 MG tablet; Take 0.5-1 tablets (5-10 mg total) by mouth at bedtime.  Dispense: 90 tablet; Refill: 0  14. Need for shingles vaccine  - Varicella-zoster vaccine IM

## 2020-09-14 ENCOUNTER — Other Ambulatory Visit: Payer: Self-pay

## 2020-09-14 ENCOUNTER — Ambulatory Visit: Payer: BC Managed Care – PPO | Admitting: Family Medicine

## 2020-09-14 ENCOUNTER — Encounter: Payer: Self-pay | Admitting: Family Medicine

## 2020-09-14 VITALS — BP 136/88 | HR 85 | Temp 98.2°F | Resp 16 | Ht 63.0 in | Wt 218.0 lb

## 2020-09-14 DIAGNOSIS — Z23 Encounter for immunization: Secondary | ICD-10-CM | POA: Diagnosis not present

## 2020-09-14 DIAGNOSIS — E538 Deficiency of other specified B group vitamins: Secondary | ICD-10-CM

## 2020-09-14 DIAGNOSIS — I10 Essential (primary) hypertension: Secondary | ICD-10-CM | POA: Diagnosis not present

## 2020-09-14 DIAGNOSIS — E785 Hyperlipidemia, unspecified: Secondary | ICD-10-CM

## 2020-09-14 DIAGNOSIS — G4700 Insomnia, unspecified: Secondary | ICD-10-CM

## 2020-09-14 DIAGNOSIS — J449 Chronic obstructive pulmonary disease, unspecified: Secondary | ICD-10-CM

## 2020-09-14 DIAGNOSIS — E8881 Metabolic syndrome: Secondary | ICD-10-CM

## 2020-09-14 DIAGNOSIS — M8580 Other specified disorders of bone density and structure, unspecified site: Secondary | ICD-10-CM

## 2020-09-14 DIAGNOSIS — E559 Vitamin D deficiency, unspecified: Secondary | ICD-10-CM

## 2020-09-14 DIAGNOSIS — T451X5A Adverse effect of antineoplastic and immunosuppressive drugs, initial encounter: Secondary | ICD-10-CM

## 2020-09-14 DIAGNOSIS — K219 Gastro-esophageal reflux disease without esophagitis: Secondary | ICD-10-CM

## 2020-09-14 DIAGNOSIS — G620 Drug-induced polyneuropathy: Secondary | ICD-10-CM

## 2020-09-14 DIAGNOSIS — J302 Other seasonal allergic rhinitis: Secondary | ICD-10-CM

## 2020-09-14 DIAGNOSIS — M62838 Other muscle spasm: Secondary | ICD-10-CM

## 2020-09-14 MED ORDER — TRELEGY ELLIPTA 100-62.5-25 MCG/INH IN AEPB: 1.0000 | INHALATION_SPRAY | Freq: Every day | RESPIRATORY_TRACT | 5 refills | Status: DC

## 2020-09-14 MED ORDER — VITAMIN D (ERGOCALCIFEROL) 1.25 MG (50000 UNIT) PO CAPS
50000.0000 [IU] | ORAL_CAPSULE | ORAL | 0 refills | Status: DC
Start: 2020-09-14 — End: 2020-12-14

## 2020-09-14 MED ORDER — ZOLPIDEM TARTRATE 10 MG PO TABS
10.0000 mg | ORAL_TABLET | Freq: Every day | ORAL | 0 refills | Status: DC
Start: 2020-09-14 — End: 2020-12-14

## 2020-09-14 MED ORDER — CYCLOBENZAPRINE HCL 10 MG PO TABS: 5.0000 mg | ORAL_TABLET | Freq: Every day | ORAL | 0 refills | Status: DC

## 2020-10-28 ENCOUNTER — Ambulatory Visit: Payer: Self-pay | Admitting: *Deleted

## 2020-10-28 NOTE — Telephone Encounter (Signed)
Pt called in c/o a headache since falling last Saturday at work.   She blacked out for 4-5 seconds.   The fall was not witnessed per pt.    See notes below for details.   She did tell her supervisor about the fall.   Was not seen by any providers since the fall.  Covid questionnaire completed and due to the headache symptom she needs to be scheduled a virtual visit.   She does not have a MyChart acct. The agent had scheduled her for Monday with Dr. Ancil Boozer prior to having me talk with pt since it was a fall.    The appt on Monday was kept but since she failed the decision tree protocol for covid I wanted to relay that to the office.   Notes sent to [facility] for their information and to be sure they were ok with keeping the appt in office on Monday.   Her headache is from the fall. Reason for Disposition . [1] After 72 hours AND [2] headache persists    Fell last Saturday.  Answer Assessment - Initial Assessment Questions 1. MECHANISM: "How did the injury happen?" For falls, ask: "What height did you fall from?" and "What surface did you fall against?"      Last Saturday I was at work and stepped down off a stool and hit a cone and hit my head.   I went black for 4-5 seconds.   2. ONSET: "When did the injury happen?" (Minutes or hours ago)      Last Saturday and hit the back of my head.     I'm sore from my butt to my head.   My neck is getting better.   At first I could not turn it but now I can.   Still having some pain in my neck.     3. NEUROLOGIC SYMPTOMS: "Was there any loss of consciousness?" "Are there any other neurological symptoms?"      Went black for 4-5 seconds.    My head was hurting after I came to.   No bumps or lacerations on back of head.    I was on a wood platform and hit the back of my head on the wood platform.  4. MENTAL STATUS: "Does the person know who he is, who you are, and where he is?"      Yes 5. LOCATION: "What part of the head was hit?"       Back of head 6. SCALP APPEARANCE: "What does the scalp look like? Is it bleeding now?" If Yes, ask: "Is it difficult to stop?"      No lacerations or bumps per pt. 7. SIZE: For cuts, bruises, or swelling, ask: "How large is it?" (e.g., inches or centimeters)      None 8. PAIN: "Is there any pain?" If Yes, ask: "How bad is it?"  (e.g., Scale 1-10; or mild, moderate, severe)     Yes headache since.  It comes and goes.   The headache is in the back of my head and neck. 9. TETANUS: For any breaks in the skin, ask: "When was the last tetanus booster?"     NA 10. OTHER SYMPTOMS: "Do you have any other symptoms?" (e.g., neck pain, vomiting)       This morning I feel lightheaded and a little nausea.   No passing out since the fall.  No visual changes.   No dizziness.  The lightheadedness is all the time  since I fell.  Denies feeling sleepy or tired.     I told my supervisor I called them after I fell.    I haven't been to any doctors since the fall. 11. PREGNANCY: "Is there any chance you are pregnant?" "When was your last menstrual period?"       Not asked due to age  Protocols used: [place]

## 2020-10-31 ENCOUNTER — Ambulatory Visit: Payer: BC Managed Care – PPO | Admitting: Family Medicine

## 2020-10-31 NOTE — Progress Notes (Deleted)
Name: Brenda Jones   MRN: [id number]    DOB: 03-Jun-1966   Date:10/31/2020       Progress Note  Subjective  Chief Complaint  Headache  HPI    Patient Active Problem List   Diagnosis Date Noted  . History of colonic polyps   . Polyp of transverse colon   . Polyp of sigmoid colon   . History of breast cancer 06/24/2019  . Morbid obesity ([place]) 03/12/2018  . Osteoarthritis of right knee 08/29/2016  . Osteopenia due to cancer therapy 04/18/2015  . GERD (gastroesophageal reflux disease) 04/18/2015  . Muscle spasm 04/18/2015  . Benign neoplasm of descending colon   . Cervical high risk HPV (human papillomavirus) test positive 03/22/2015  . Benign essential HTN 01/08/2015  . Insomnia, persistent 01/08/2015  . COPD, mild ([place]) 01/08/2015  . HLD (hyperlipidemia) 01/08/2015  . Dysmetabolic syndrome 78/58/8502  . Severe obesity (BMI 35.0-35.9 with comorbidity) ([place]) 01/08/2015  . Seasonal allergic rhinitis 01/08/2015  . Neuropathy due to chemotherapeutic drug ([place]) 01/08/2015  . Arthralgia of multiple joints 01/08/2015    Past Surgical History:  Procedure Laterality Date  . BLADDER SURGERY  2007  . BREAST BIOPSY Left 2013   invasive mammary carcinoma  . BREAST LUMPECTOMY Left 2013   invasive mammary carcinoma with 2 positive lymph nodes. Margins were clear.   Marland Kitchen BREAST SURGERY    . COLONOSCOPY WITH PROPOFOL N/A 04/04/2015   Procedure: COLONOSCOPY WITH PROPOFOL;  Surgeon: [name], MD;  Location: [place];  Service: Endoscopy;  Laterality: N/A;  . COLONOSCOPY WITH PROPOFOL N/A 07/19/2020   Procedure: COLONOSCOPY WITH PROPOFOL;  Surgeon: [name], MD;  Location: [facility]COPY;  Service: Endoscopy;  Laterality: N/A;  . POLYPECTOMY  04/04/2015   Procedure: POLYPECTOMY;  Surgeon: [name], MD;  Location: [place];  Service: Endoscopy;;    Family History  Problem Relation Age of Onset  . Heart disease Mother   . Diabetes Father   . Hypertension  Father     Social History   Tobacco Use  . Smoking status: Current Every Day Smoker    Packs/day: 0.50    Years: 36.00    Pack years: 18.00    Types: Cigarettes    Start date: 06/27/1981  . Smokeless tobacco: Never Used  Substance Use Topics  . Alcohol use: No    Alcohol/week: 0.0 standard drinks     Current Outpatient Medications:  .  Cyanocobalamin (VITAMIN B-12) 1000 MCG SUBL, Place 1 tablet (1,000 mcg total) under the tongue 3 (three) times a week., Disp: 100 tablet, Rfl: 0 .  cyclobenzaprine (FLEXERIL) 10 MG tablet, Take 0.5-1 tablets (5-10 mg total) by mouth at bedtime., Disp: 90 tablet, Rfl: 0 .  fluticasone (FLONASE) 50 MCG/ACT nasal spray, PLACE 2 SPRAYS INTO BOTH NOSTRILS AS NEEDED., Disp: 48 mL, Rfl: 1 .  Fluticasone-Umeclidin-Vilant (TRELEGY ELLIPTA) 100-62.5-25 MCG/INH AEPB, Inhale 1 puff into the lungs daily., Disp: 60 each, Rfl: 5 .  gabapentin (NEURONTIN) 300 MG capsule, Take 1 capsule (300 mg total) by mouth daily., Disp: 90 capsule, Rfl: 1 .  loratadine (CLARITIN) 10 MG tablet, Take 1 tablet (10 mg total) by mouth as needed for allergies., Disp: 30 tablet, Rfl: 2 .  losartan-hydrochlorothiazide (HYZAAR) 100-12.5 MG tablet, Take 1 tablet by mouth daily. NEW DOSE, Disp: 90 tablet, Rfl: 0 .  omeprazole (PRILOSEC) 40 MG capsule, Take 1 capsule (40 mg total) by mouth daily., Disp: 90 capsule, Rfl: 1 .  Vitamin D, Ergocalciferol, (DRISDOL) 1.25  MG (50000 UNIT) CAPS capsule, Take 1 capsule (50,000 Units total) by mouth every 7 (seven) days., Disp: 12 capsule, Rfl: 0 .  zolpidem (AMBIEN) 10 MG tablet, Take 1 tablet (10 mg total) by mouth at bedtime., Disp: 90 tablet, Rfl: 0  Allergies  Allergen Reactions  . Ace Inhibitors Cough    I personally reviewed {Reviewed:14835} with the patient/caregiver today.   ROS  ***  Objective  There were no vitals filed for this visit.  There is no height or weight on file to calculate BMI.  Physical Exam ***  No results  found for this or any previous visit (from the past 2160 hour(s)).  Diabetic Foot Exam: Diabetic Foot Exam - Simple   No data filed    ***  PHQ2/9: Depression screen [facility] 2/9 09/14/2020 07/14/2020 06/10/2020 12/09/2019 06/24/2019  Decreased Interest 0 0 0 0 0  Down, Depressed, Hopeless 0 0 0 0 0  PHQ - 2 Score 0 0 0 0 0  Altered sleeping - 3 3 0 0  Tired, decreased energy - 0 1 0 0  Change in appetite - 0 0 0 0  Feeling bad or failure about yourself  - 0 0 0 0  Trouble concentrating - 0 0 0 0  Moving slowly or fidgety/restless - 0 0 0 0  Suicidal thoughts - 0 0 0 0  PHQ-9 Score - 3 4 0 0  Difficult doing work/chores - - Not difficult at all - -  Some recent data might be hidden    phq 9 is {gen pos [id number]} ***  Fall Risk: Fall Risk  09/14/2020 07/14/2020 06/10/2020 12/09/2019 06/24/2019  Falls in the past year? 0 0 0 0 0  Number falls in past yr: 0 0 0 0 0  Injury with Fall? 0 0 0 0 0   ***   Functional Status Survey:   ***   Assessment & Plan  *** There are no diagnoses linked to this encounter.

## 2020-11-18 ENCOUNTER — Emergency Department: Payer: BC Managed Care – PPO

## 2020-11-18 ENCOUNTER — Other Ambulatory Visit: Payer: Self-pay

## 2020-11-18 ENCOUNTER — Emergency Department
Admission: EM | Admit: 2020-11-18 | Discharge: 2020-11-18 | Disposition: A | Discharge status: Home / Self Care | Payer: BC Managed Care – PPO | Attending: Emergency Medicine | Admitting: Emergency Medicine

## 2020-11-18 DIAGNOSIS — Z79899 Other long term (current) drug therapy: Secondary | ICD-10-CM | POA: Insufficient documentation

## 2020-11-18 DIAGNOSIS — M25512 Pain in left shoulder: Secondary | ICD-10-CM | POA: Insufficient documentation

## 2020-11-18 DIAGNOSIS — F17210 Nicotine dependence, cigarettes, uncomplicated: Secondary | ICD-10-CM | POA: Diagnosis not present

## 2020-11-18 DIAGNOSIS — I6529 Occlusion and stenosis of unspecified carotid artery: Secondary | ICD-10-CM | POA: Diagnosis not present

## 2020-11-18 DIAGNOSIS — M7918 Myalgia, other site: Secondary | ICD-10-CM

## 2020-11-18 DIAGNOSIS — Z041 Encounter for examination and observation following transport accident: Secondary | ICD-10-CM | POA: Diagnosis not present

## 2020-11-18 DIAGNOSIS — Z85038 Personal history of other malignant neoplasm of large intestine: Secondary | ICD-10-CM | POA: Insufficient documentation

## 2020-11-18 DIAGNOSIS — M791 Myalgia, unspecified site: Secondary | ICD-10-CM | POA: Diagnosis not present

## 2020-11-18 DIAGNOSIS — J449 Chronic obstructive pulmonary disease, unspecified: Secondary | ICD-10-CM | POA: Diagnosis not present

## 2020-11-18 DIAGNOSIS — M79602 Pain in left arm: Secondary | ICD-10-CM | POA: Insufficient documentation

## 2020-11-18 DIAGNOSIS — I10 Essential (primary) hypertension: Secondary | ICD-10-CM | POA: Diagnosis not present

## 2020-11-18 DIAGNOSIS — M542 Cervicalgia: Secondary | ICD-10-CM | POA: Insufficient documentation

## 2020-11-18 DIAGNOSIS — M25562 Pain in left knee: Secondary | ICD-10-CM | POA: Diagnosis not present

## 2020-11-18 DIAGNOSIS — M25561 Pain in right knee: Secondary | ICD-10-CM | POA: Diagnosis not present

## 2020-11-18 DIAGNOSIS — Z7951 Long term (current) use of inhaled steroids: Secondary | ICD-10-CM | POA: Insufficient documentation

## 2020-11-18 DIAGNOSIS — M25511 Pain in right shoulder: Secondary | ICD-10-CM | POA: Diagnosis not present

## 2020-11-18 DIAGNOSIS — Z853 Personal history of malignant neoplasm of breast: Secondary | ICD-10-CM | POA: Diagnosis not present

## 2020-11-18 DIAGNOSIS — Y92410 Unspecified street and highway as the place of occurrence of the external cause: Secondary | ICD-10-CM | POA: Insufficient documentation

## 2020-11-18 DIAGNOSIS — J439 Emphysema, unspecified: Secondary | ICD-10-CM | POA: Diagnosis not present

## 2020-11-18 MED ORDER — CYCLOBENZAPRINE HCL 5 MG PO TABS: 5.0000 mg | ORAL_TABLET | Freq: Three times a day (TID) | ORAL | 0 refills | Status: DC | PRN

## 2020-11-18 NOTE — ED Notes (Signed)
Patient reports she needs to go and is requesting her discharge papers, provider aware.
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
## 2020-12-06 ENCOUNTER — Encounter: Payer: Self-pay | Admitting: Unknown Physician Specialty

## 2020-12-06 ENCOUNTER — Ambulatory Visit (INDEPENDENT_AMBULATORY_CARE_PROVIDER_SITE_OTHER): Payer: Self-pay | Admitting: Unknown Physician Specialty

## 2020-12-06 ENCOUNTER — Other Ambulatory Visit: Payer: Self-pay

## 2020-12-06 VITALS — BP 130/80 | HR 100 | Temp 98.3°F | Resp 16 | Ht 63.0 in | Wt 214.7 lb

## 2020-12-06 DIAGNOSIS — M791 Myalgia, unspecified site: Secondary | ICD-10-CM

## 2020-12-06 DIAGNOSIS — M542 Cervicalgia: Secondary | ICD-10-CM

## 2020-12-06 MED ORDER — CYCLOBENZAPRINE HCL 10 MG PO TABS: 10.0000 mg | ORAL_TABLET | Freq: Three times a day (TID) | ORAL | 0 refills | Status: DC | PRN

## 2020-12-06 MED ORDER — MELOXICAM 15 MG PO TABS: 15.0000 mg | ORAL_TABLET | Freq: Every day | ORAL | 0 refills | Status: DC
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
## 2020-12-14 ENCOUNTER — Ambulatory Visit: Payer: BC Managed Care – PPO | Admitting: Family Medicine

## 2020-12-14 ENCOUNTER — Other Ambulatory Visit: Payer: Self-pay

## 2020-12-14 ENCOUNTER — Encounter: Payer: Self-pay | Admitting: Family Medicine

## 2020-12-14 DIAGNOSIS — E559 Vitamin D deficiency, unspecified: Secondary | ICD-10-CM

## 2020-12-14 DIAGNOSIS — E785 Hyperlipidemia, unspecified: Secondary | ICD-10-CM

## 2020-12-14 DIAGNOSIS — G620 Drug-induced polyneuropathy: Secondary | ICD-10-CM | POA: Diagnosis not present

## 2020-12-14 DIAGNOSIS — E538 Deficiency of other specified B group vitamins: Secondary | ICD-10-CM

## 2020-12-14 DIAGNOSIS — I10 Essential (primary) hypertension: Secondary | ICD-10-CM

## 2020-12-14 DIAGNOSIS — J302 Other seasonal allergic rhinitis: Secondary | ICD-10-CM

## 2020-12-14 DIAGNOSIS — J449 Chronic obstructive pulmonary disease, unspecified: Secondary | ICD-10-CM

## 2020-12-14 DIAGNOSIS — K219 Gastro-esophageal reflux disease without esophagitis: Secondary | ICD-10-CM

## 2020-12-14 DIAGNOSIS — E8881 Metabolic syndrome: Secondary | ICD-10-CM | POA: Diagnosis not present

## 2020-12-14 DIAGNOSIS — G4700 Insomnia, unspecified: Secondary | ICD-10-CM

## 2020-12-14 DIAGNOSIS — T451X5A Adverse effect of antineoplastic and immunosuppressive drugs, initial encounter: Secondary | ICD-10-CM

## 2020-12-14 MED ORDER — OMEPRAZOLE 40 MG PO CPDR: 40.0000 mg | DELAYED_RELEASE_CAPSULE | Freq: Every day | ORAL | 1 refills | Status: DC

## 2020-12-14 MED ORDER — ZOLPIDEM TARTRATE 10 MG PO TABS
10.0000 mg | ORAL_TABLET | Freq: Every day | ORAL | 0 refills | Status: DC
Start: 2020-12-14 — End: 2021-03-14

## 2020-12-14 MED ORDER — LOSARTAN POTASSIUM-HCTZ 100-12.5 MG PO TABS: 1.0000 | ORAL_TABLET | Freq: Every day | ORAL | 1 refills | Status: DC

## 2020-12-14 MED ORDER — GABAPENTIN 300 MG PO CAPS: 300.0000 mg | ORAL_CAPSULE | Freq: Every day | ORAL | 1 refills | Status: DC

## 2020-12-14 MED ORDER — VITAMIN D (ERGOCALCIFEROL) 1.25 MG (50000 UNIT) PO CAPS: 50000.0000 [IU] | ORAL_CAPSULE | ORAL | 1 refills | Status: DC

## 2020-12-15 LAB — COMPLETE METABOLIC PANEL WITH GFR
AG Ratio: 1.5 (calc) (ref 1.0–2.5)
ALT: 15 U/L (ref 6–29)
AST: 15 U/L (ref 10–35)
Albumin: 4 g/dL (ref 3.6–5.1)
Alkaline phosphatase (APISO): 86 U/L (ref 37–153)
BUN: 11 mg/dL (ref 7–25)
CO2: 27 mmol/L (ref 20–32)
Calcium: 9.6 mg/dL (ref 8.6–10.4)
Chloride: 106 mmol/L (ref 98–110)
Creat: 0.77 mg/dL (ref 0.50–1.05)
GFR, Est African American: 101 mL/min/{1.73_m2} (ref >=60)
GFR, Est Non African American: 87 mL/min/{1.73_m2} (ref >=60)
Globulin: 2.7 g/dL (calc) (ref 1.9–3.7)
Glucose, Bld: 119 mg/dL — ABNORMAL HIGH (ref 65–99)
Potassium: 3.8 mmol/L (ref 3.5–5.3)
Sodium: 141 mmol/L (ref 135–146)
Total Bilirubin: 0.3 mg/dL (ref 0.2–1.2)
Total Protein: 6.7 g/dL (ref 6.1–8.1)

## 2020-12-15 LAB — CBC WITH DIFFERENTIAL/PLATELET
Absolute Monocytes: 337 cells/uL (ref 200–950)
Basophils Absolute: 55 cells/uL (ref 0–200)
Basophils Relative: 0.6 %
Eosinophils Absolute: 118 cells/uL (ref 15–500)
Eosinophils Relative: 1.3 %
HCT: 39.5 % (ref 35.0–45.0)
Hemoglobin: 12.8 g/dL (ref 11.7–15.5)
Lymphs Abs: 2857 cells/uL (ref 850–3900)
MCH: 27.1 pg (ref 27.0–33.0)
MCHC: 32.4 g/dL (ref 32.0–36.0)
MCV: 83.5 fL (ref 80.0–100.0)
MPV: 10.9 fL (ref 7.5–12.5)
Monocytes Relative: 3.7 %
Neutro Abs: 5733 cells/uL (ref 1500–7800)
Neutrophils Relative %: 63 %
Platelets: 314 10*3/uL (ref 140–400)
RBC: 4.73 10*6/uL (ref 3.80–5.10)
RDW: 14.4 % (ref 11.0–15.0)
Total Lymphocyte: 31.4 %
WBC: 9.1 10*3/uL (ref 3.8–10.8)

## 2020-12-15 LAB — VITAMIN B12: Vitamin B-12: 374 pg/mL (ref 200–1100)

## 2020-12-15 LAB — HEMOGLOBIN A1C
Hgb A1c MFr Bld: 6.2 % of total Hgb — ABNORMAL HIGH (ref <5.7)
Mean Plasma Glucose: 131 mg/dL
eAG (mmol/L): 7.3 mmol/L

## 2020-12-15 LAB — LIPID PANEL
Cholesterol: 138 mg/dL (ref <200)
HDL: 45 mg/dL — ABNORMAL LOW (ref >=50)
LDL Cholesterol (Calc): 76 mg/dL (calc)
Non-HDL Cholesterol (Calc): 93 mg/dL (calc) (ref <130)
Total CHOL/HDL Ratio: 3.1 (calc) (ref <5.0)
Triglycerides: 86 mg/dL (ref <150)

## 2020-12-15 LAB — VITAMIN D 25 HYDROXY (VIT D DEFICIENCY, FRACTURES): Vit D, 25-Hydroxy: 57 ng/mL (ref 30–100)

## 2020-12-27 ENCOUNTER — Ambulatory Visit: Payer: BC Managed Care – PPO

## 2020-12-27 ENCOUNTER — Other Ambulatory Visit: Payer: Self-pay | Admitting: Family Medicine

## 2020-12-27 DIAGNOSIS — I10 Essential (primary) hypertension: Secondary | ICD-10-CM

## 2021-01-03 ENCOUNTER — Ambulatory Visit: Payer: BC Managed Care – PPO | Admitting: Unknown Physician Specialty

## 2021-02-07 ENCOUNTER — Telehealth: Payer: Self-pay

## 2021-02-07 NOTE — Telephone Encounter (Signed)
Copied from [place] [id number]. Topic: General - Inquiry >> Feb 07, 2021  2:24 PM Greggory Keen D wrote: Pt tested positive about two weeks ago for covid but she still has a lingering cough.  Is there anything that can be prescribed.  [name] has taken otc but not helped.  [place]  CB#  [phone]

## 2021-02-08 ENCOUNTER — Other Ambulatory Visit: Payer: Self-pay

## 2021-02-08 ENCOUNTER — Encounter: Payer: Self-pay | Admitting: Family Medicine

## 2021-02-08 ENCOUNTER — Telehealth (INDEPENDENT_AMBULATORY_CARE_PROVIDER_SITE_OTHER): Payer: BC Managed Care – PPO | Admitting: Family Medicine

## 2021-02-08 DIAGNOSIS — R059 Cough, unspecified: Secondary | ICD-10-CM

## 2021-02-08 DIAGNOSIS — J302 Other seasonal allergic rhinitis: Secondary | ICD-10-CM

## 2021-02-08 DIAGNOSIS — R058 Other specified cough: Secondary | ICD-10-CM | POA: Diagnosis not present

## 2021-02-08 MED ORDER — LORATADINE 10 MG PO TABS: 10.0000 mg | ORAL_TABLET | Freq: Every day | ORAL | 2 refills | Status: AC | PRN

## 2021-02-08 MED ORDER — BENZONATATE 100 MG PO CAPS: 100.0000 mg | ORAL_CAPSULE | Freq: Three times a day (TID) | ORAL | 0 refills | Status: DC | PRN

## 2021-02-08 NOTE — Progress Notes (Signed)
Virtual Visit Note  I connected with Brenda Jones on 02/08/21 at  4:00 PM EDT by phone and verified that I am speaking with the correct person using two identifiers.  Location: Patient: car Provider: [facility]   I discussed the limitations of evaluation and management by telemedicine and the availability of in person appointments. The patient expressed understanding and agreed to proceed.  History of Present Illness:  COUGH - lingering dry coughsince having COVID 2 weeks ago - nonproductive - taking trelegy  Duration: weeks Circumstances of initial development of cough: URI (COVID) Cough description: non-productive and irritating Aggravating factors:  nothing Alleviating factors: nothing Status:  stable Treatments attempted: mucinex and albuterol, nyquil, robatussin Wheezing: no Shortness of breath: no Chest pain: no Chest tightness:no Nasal congestion: no Runny nose: no Hemoptysis: no Fevers: no Night sweats: no Weight loss: no    Observations/Objective:  Entirety of visit conducted over the phone.  Speaks in full sentences, no respiratory distress.   Assessment and Plan:  Post-viral cough Likely post-viral upper airway inflammation. Recommend antihistamine. Will also provide tessalon perles. F/u if symptoms worsen, develop fever, or cough becomes productive.   I discussed the assessment and treatment plan with the patient. The patient was provided an opportunity to ask questions and all were answered. The patient agreed with the plan and demonstrated an understanding of the instructions.   The patient was advised to call back or seek an in-person evaluation if the symptoms worsen or if the condition fails to improve as anticipated.  I provided 9 minutes of non-face-to-face time during this encounter.   Myles Gip, DO

## 2021-03-09 NOTE — Progress Notes (Signed)
Name: Brenda Jones   MRN: [id number]    DOB: 12-20-1965   Date:03/14/2021       Progress Note  Subjective  Chief Complaint  Follow Up  HPI  Right shoulder pain: started about one month ago , initially only on right shoulder, but now is constant and radiating up to right side of neck and also towards top of her hand. Seems worse first thing in the morning. Pain at this time is 7-8/10. Pain is described as sharp on top of the shoulder. She is taking meloxicam and otc Advil ( explained same class of medication and cannot be taken together ). She is also taking tylenol   HTN: taking bp medication daily, now on losartan hctz,  she continues to have  cramping since chemo back in 2011-10-11. No chest pain, palpitation or dizziness,  she has chronic edema on both ankles that has been stable. Heart rate was up when she came in but she states it is because she was walking.    GERD: seen by Dr. Durwin Reges, history of h. Pylori, still taking Omeprazole but only every other day and is doing well. 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 Used to see Dr. Grayland Ormond, currently taking vitamin D rx, she had repeat bone density im 02/2020 and it showed mild progression at spine level, femur has been stable. Discussed medication but she wants to continue supplements and high calcium diet Unchanged     Insomnia: working 3 am to 5-6 pm, taking Ambien around as soon as she gets home. She only has one day off a week.    COPD: She quit smoking for about one month but resumed after father died 2017-10-10, she is up to 1 pack a day now. She started smoking at age 42 , discussed importance of trying to quit again, pneumonia vaccines up to date, discussed COVID -19 vaccine with patient again and she states she will get it at local pharmacy She has a morning cough mostly dry but has clear sputum at times, no wheezing but has some SOB with activity. She is now on Trelegy and skips at  times. Advised to take it daily    Muscles Spasms: got much worse after chemotherapy  - in 10-11-2011 and takes Cyclobenzaprine to control symptoms, usually in the evening when she gets home from work . It can be on her trunk or legs, she has tried magnesium and co Q 10 without improvement of symptoms, therefore she stopped the supplements.  She is aware to not take Ambien, Flexeril and gabapentin all at the same time    History of breast cancer left:s/p lumpectomy she completed 5 years of Anastrozole 03/2017. Last  Mammogram was done 02/2020 and saw Dr. Grayland Ormond Fall 2021 , she will contact [facility] today to schedule a follow up   Peripheral Neuropathy due to chemotherapy : still has daily pain on both legs and feet also also tips of fingers. She is on Gabapentin but not taking it daily, Lyrica did not work for her, Gabapentin makes her sleepy, advised to take it in the evening when she arrives from work. She states pain is slightly better at 5-6 /10 all the time , it has been unchanged for a long time . Discussed going up on the dose but she states unable to tolerate more , makes her feel dizzy    Morbid Obesity : BMI 35 with co-morbidities- OA, HTN ,  GERD, Metabolic syndrome. She lost 9 lbs since last visit, she states she had COVID-19, appetite is back but has not gained any weight back yet, discussed trying to keep as it is   Patient Active Problem List   Diagnosis Date Noted   History of colonic polyps    Polyp of transverse colon    Polyp of sigmoid colon    History of breast cancer 06/24/2019   Morbid obesity ([place]) 03/12/2018   Osteoarthritis of right knee 08/29/2016   Osteopenia due to cancer therapy 04/18/2015   GERD (gastroesophageal reflux disease) 04/18/2015   Muscle spasm 04/18/2015   Benign neoplasm of descending colon    Cervical high risk HPV (human papillomavirus) test positive 03/22/2015   Benign essential HTN 01/08/2015   Insomnia, persistent 01/08/2015   COPD, mild ([place])  01/08/2015   HLD (hyperlipidemia) XX123456   Dysmetabolic syndrome [id number]   Severe obesity (BMI 35.0-35.9 with comorbidity) ([place]) 01/08/2015   Seasonal allergic rhinitis 01/08/2015   Neuropathy due to chemotherapeutic drug ([place]) 01/08/2015   Arthralgia of multiple joints 01/08/2015    Past Surgical History:  Procedure Laterality Date   BLADDER SURGERY  2007   BREAST BIOPSY Left 2013   invasive mammary carcinoma   BREAST LUMPECTOMY Left 2013   invasive mammary carcinoma with 2 positive lymph nodes. Margins were clear.    BREAST SURGERY     COLONOSCOPY WITH PROPOFOL N/A 04/04/2015   Procedure: COLONOSCOPY WITH PROPOFOL;  Surgeon: [name], MD;  Location: [facility];  Service: Endoscopy;  Laterality: N/A;   COLONOSCOPY WITH PROPOFOL N/A 07/19/2020   Procedure: COLONOSCOPY WITH PROPOFOL;  Surgeon: [name], MD;  Location: [facility] END[facility]COPY;  Service: Endoscopy;  Laterality: N/A;   POLYPECTOMY  04/04/2015   Procedure: POLYPECTOMY;  Surgeon: [name], MD;  Location: [facility];  Service: Endoscopy;;    Family History  Problem Relation Age of Onset   Heart disease Mother    Diabetes Father    Hypertension Father     Social History   Tobacco Use   Smoking status: Every Day    Packs/day: 0.50    Years: 36.00    Pack years: 18.00    Types: Cigarettes    Start date: 06/27/1981   Smokeless tobacco: Never  Substance Use Topics   Alcohol use: No    Alcohol/week: 0.0 standard drinks     Current Outpatient Medications:    benzonatate (TESSALON PERLES) 100 MG capsule, Take 1 capsule (100 mg total) by mouth 3 (three) times daily as needed for cough., Disp: 20 capsule, Rfl: 0   Cyanocobalamin (VITAMIN B-12) 1000 MCG SUBL, Place 1 tablet (1,000 mcg total) under the tongue 3 (three) times a week., Disp: 100 tablet, Rfl: 0   cyclobenzaprine (FLEXERIL) 10 MG tablet, Take 1 tablet (10 mg total) by mouth 3 (three) times daily as needed for muscle spasms., Disp:  30 tablet, Rfl: 0   fluticasone (FLONASE) 50 MCG/ACT nasal spray, PLACE 2 SPRAYS INTO BOTH NOSTRILS AS NEEDED., Disp: 48 mL, Rfl: 1   Fluticasone-Umeclidin-Vilant (TRELEGY ELLIPTA) 100-62.5-25 MCG/INH AEPB, Inhale 1 puff into the lungs daily., Disp: 60 each, Rfl: 5   gabapentin (NEURONTIN) 300 MG capsule, Take 1 capsule (300 mg total) by mouth daily., Disp: 90 capsule, Rfl: 1   loratadine (CLARITIN) 10 MG tablet, Take 1 tablet (10 mg total) by mouth daily as needed (cough, allergies)., Disp: 30 tablet, Rfl: 2   losartan-hydrochlorothiazide (HYZAAR) 100-12.5 MG tablet, Take 1 tablet by mouth daily.,  Disp: 90 tablet, Rfl: 1   meloxicam (MOBIC) 15 MG tablet, Take 1 tablet (15 mg total) by mouth daily., Disp: 30 tablet, Rfl: 0   omeprazole (PRILOSEC) 40 MG capsule, Take 1 capsule (40 mg total) by mouth daily., Disp: 90 capsule, Rfl: 1   Vitamin D, Ergocalciferol, (DRISDOL) 1.25 MG (50000 UNIT) CAPS capsule, Take 1 capsule (50,000 Units total) by mouth every 7 (seven) days., Disp: 12 capsule, Rfl: 1   zolpidem (AMBIEN) 10 MG tablet, Take 1 tablet (10 mg total) by mouth at bedtime., Disp: 90 tablet, Rfl: 0  Allergies  Allergen Reactions   Ace Inhibitors Cough    I personally reviewed active problem list, medication list, allergies, family history, social history, health maintenance with the patient/caregiver today.   ROS  Constitutional: Negative for fever , positive for weight change.  Respiratory: Negative for cough and shortness of breath.   Cardiovascular: Negative for chest pain or palpitations.  Gastrointestinal: Negative for abdominal pain, no bowel changes.  Musculoskeletal: Negative for gait problem or joint swelling.  Skin: Negative for rash.  Neurological: Negative for dizziness or headache.  No other specific complaints in a complete review of systems (except as listed in HPI above).   Objective  Vitals:   03/14/21 0737  BP: 136/88  Pulse: (!) 103  Resp: 16  Temp: 98 F  (36.7 C)  SpO2: 98%  Weight: 209 lb (94.8 kg)  Height: '5\' 3"'$  (1.6 m)    Body mass index is 37.02 kg/m.  Physical Exam  Constitutional: Patient appears well-developed and well-nourished. Obese  No distress.  HEENT: head atraumatic, normocephalic, pupils equal and reactive to light,neck supple Cardiovascular: Normal rate, regular rhythm and normal heart sounds.  No murmur heard. No BLE edema. Pulmonary/Chest: Effort normal and breath sounds normal. No respiratory distress. Abdominal: Soft.  There is no tenderness. Muscular Skeletal: positive impingement sign , pain during palpation of shoulder, normal neck exam, normal grip  Psychiatric: Patient has a normal mood and affect. behavior is normal. Judgment and thought content normal.   Recent Results (from the past 2160 hour(s))  Lipid panel     Status: Abnormal   Collection Time: 12/14/20  8:17 AM  Result Value Ref Range   Cholesterol 138 <200 mg/dL   HDL 45 (L) > OR = 50 mg/dL   Triglycerides 86 <150 mg/dL   LDL Cholesterol (Calc) 76 mg/dL (calc)    Comment: Reference range: <100 . Desirable range <100 mg/dL for primary prevention;   <70 mg/dL for patients with CHD or diabetic patients  with > or = 2 CHD risk factors. Marland Kitchen LDL-C is now calculated using the Martin-Hopkins  calculation, which is a validated novel method providing  better accuracy than the Friedewald equation in the  estimation of LDL-C.  Cresenciano Genre et al. Annamaria Helling. [id number]): 2061-2068  (http://education.[facility].com/faq/FAQ164)    Total CHOL/HDL Ratio 3.1 <5.0 (calc)   Non-HDL Cholesterol (Calc) 93 <130 mg/dL (calc)    Comment: For patients with diabetes plus 1 major ASCVD risk  factor, treating to a non-HDL-C goal of <100 mg/dL  (LDL-C of <70 mg/dL) is considered a therapeutic  option.   COMPLETE METABOLIC PANEL WITH GFR     Status: Abnormal   Collection Time: 12/14/20  8:17 AM  Result Value Ref Range   Glucose, Bld 119 (H) 65 - 99 mg/dL     Comment: .            Fasting reference interval . For someone without known diabetes,  a glucose value between 100 and 125 mg/dL is consistent with prediabetes and should be confirmed with a follow-up test. .    BUN 11 7 - 25 mg/dL   Creat 0.77 0.50 - 1.05 mg/dL    Comment: For patients >53 years of age, the reference limit for Creatinine is approximately 13% higher for people identified as African-American. .    GFR, Est Non African American 87 > OR = 60 mL/min/1.67m   GFR, Est African American 101 > OR = 60 mL/min/1.772m  BUN/Creatinine Ratio NOT APPLICABLE 6 - 22 (calc)   Sodium 141 135 - 146 mmol/L   Potassium 3.8 3.5 - 5.3 mmol/L   Chloride 106 98 - 110 mmol/L   CO2 27 20 - 32 mmol/L   Calcium 9.6 8.6 - 10.4 mg/dL   Total Protein 6.7 6.1 - 8.1 g/dL   Albumin 4.0 3.6 - 5.1 g/dL   Globulin 2.7 1.9 - 3.7 g/dL (calc)   AG Ratio 1.5 1.0 - 2.5 (calc)   Total Bilirubin 0.3 0.2 - 1.2 mg/dL   Alkaline phosphatase (APISO) 86 37 - 153 U/L   AST 15 10 - 35 U/L   ALT 15 6 - 29 U/L  CBC with Differential/Platelet     Status: None   Collection Time: 12/14/20  8:17 AM  Result Value Ref Range   WBC 9.1 3.8 - 10.8 Thousand/uL   RBC 4.73 3.80 - 5.10 Million/uL   Hemoglobin 12.8 11.7 - 15.5 g/dL   HCT 39.5 35.0 - 45.0 %   MCV 83.5 80.0 - 100.0 fL   MCH 27.1 27.0 - 33.0 pg   MCHC 32.4 32.0 - 36.0 g/dL   RDW 14.4 11.0 - 15.0 %   Platelets 314 140 - 400 Thousand/uL   MPV 10.9 7.5 - 12.5 fL   Neutro Abs 5,733 1,500 - 7,800 cells/uL   Lymphs Abs 2,857 850 - 3,900 cells/uL   Absolute Monocytes 337 200 - 950 cells/uL   Eosinophils Absolute 118 15 - 500 cells/uL   Basophils Absolute 55 0 - 200 cells/uL   Neutrophils Relative % 63 %   Total Lymphocyte 31.4 %   Monocytes Relative 3.7 %   Eosinophils Relative 1.3 %   Basophils Relative 0.6 %  Hemoglobin A1c     Status: Abnormal   Collection Time: 12/14/20  8:17 AM  Result Value Ref Range   Hgb A1c MFr Bld 6.2 (H) <5.7 % of total Hgb     Comment: For someone without known diabetes, a hemoglobin  A1c value between 5.7% and 6.4% is consistent with prediabetes and should be confirmed with a  follow-up test. . For someone with known diabetes, a value <7% indicates that their diabetes is well controlled. A1c targets should be individualized based on duration of diabetes, age, comorbid conditions, and other considerations. . This assay result is consistent with an increased risk of diabetes. . Currently, no consensus exists regarding use of hemoglobin A1c for diagnosis of diabetes for children. .    Mean Plasma Glucose 131 mg/dL   eAG (mmol/L) 7.3 mmol/L  Vitamin B12     Status: None   Collection Time: 12/14/20  8:17 AM  Result Value Ref Range   Vitamin B-12 374 200 - 1,100 pg/mL    Comment: . Please Note: Although the reference range for vitamin B12 is [phone] pg/mL, it has been reported that between 5 and 10% of patients with values between 200 and 400 pg/mL may experience neuropsychiatric and  hematologic abnormalities due to occult B12 deficiency; less than 1% of patients with values above 400 pg/mL will have symptoms. Marland Kitchen   VITAMIN D 25 Hydroxy (Vit-D Deficiency, Fractures)     Status: None   Collection Time: 12/14/20  8:17 AM  Result Value Ref Range   Vit D, 25-Hydroxy 57 30 - 100 ng/mL    Comment: Vitamin D Status         25-OH Vitamin D: . Deficiency:                    <20 ng/mL Insufficiency:             20 - 29 ng/mL Optimal:                 > or = 30 ng/mL . For 25-OH Vitamin D testing on patients on  D2-supplementation and patients for whom quantitation  of D2 and D3 fractions is required, the [facility](TM) 25-OH VIT D, (D2,D3), LC/MS/MS is recommended: order  code [phone] (patients >16yr). See Note 1 . Note 1 . For additional information, please refer to  http://education.[facility].com/faq/FAQ199  (This link is being provided for informational/ educational purposes only.)       PHQ2/9: [facility] 03/14/2021 02/08/2021 12/14/2020 12/06/2020 09/14/2020  Decreased Interest 0 0 0 0 0  Down, Depressed, Hopeless 0 0 0 0 0  PHQ - 2 Score 0 0 0 0 0  Altered sleeping - - - - -  Tired, decreased energy - - - - -  Change in appetite - - - - -  Feeling bad or failure about yourself  - - - - -  Trouble concentrating - - - - -  Moving slowly or fidgety/restless - - - - -  Suicidal thoughts - - - - -  PHQ-9 Score - - - - -  Difficult doing work/chores - - - - -  Some recent data might be hidden    phq 9 is negative   Fall Risk: Fall Risk  03/14/2021 02/08/2021 12/14/2020 12/06/2020 09/14/2020  Falls in the past year? 0 0 0 - 0  Number falls in past yr: 0 0 0 0 0  Injury with Fall? 0 0 0 0 0  Follow up - Falls evaluation completed Falls evaluation completed Falls evaluation completed -      Functional Status Survey: Is the patient deaf or have difficulty hearing?: No Does the patient have difficulty seeing, even when wearing glasses/contacts?: No Does the patient have difficulty concentrating, remembering, or making decisions?: No Does the patient have difficulty walking or climbing stairs?: No Does the patient have difficulty dressing or bathing?: No Does the patient have difficulty doing errands alone such as visiting a doctor's office or shopping?: No   Assessment & Plan  1. Morbid obesity ([place]  Discussed with the patient the risk posed by an increased BMI. Discussed importance of portion control, calorie counting and at least 150 minutes of physical activity weekly. Avoid sweet beverages and drink more water. Eat at least 6 servings of fruit and vegetables daily    2. COPD, mild ([place]  - Fluticasone-Umeclidin-Vilant (TRELEGY ELLIPTA) 100-62.5-25 MCG/INH AEPB; Inhale 1 puff into the lungs daily.  Dispense: 60 each; Refill: 5  3. Peripheral neuropathy due to chemotherapy ([place]   4. Vitamin D deficiency   5. Gastroesophageal reflux  disease without esophagitis   6. Benign essential HTN   7. Dysmetabolic syndrome   8. Myalgia  - cyclobenzaprine (FLEXERIL)  10 MG tablet; Take 1 tablet (10 mg total) by mouth 3 (three) times daily as needed for muscle spasms.  Dispense: 90 tablet; Refill: 0  9. Insomnia, persistent  - zolpidem (AMBIEN) 10 MG tablet; Take 1 tablet (10 mg total) by mouth at bedtime.  Dispense: 90 tablet; Refill: 0  10. Low serum vitamin B12   11. Acute pain of right shoulder  - meloxicam (MOBIC) 15 MG tablet; Take 1 tablet (15 mg total) by mouth daily.  Dispense: 30 tablet; Refill: 0  12. Impingement syndrome of right shoulder  - meloxicam (MOBIC) 15 MG tablet; Take 1 tablet (15 mg total) by mouth daily.  Dispense: 30 tablet; Refill: 0    Consent signed: YES  Procedure: Shoulder Joint Injection Region: right Shoulder Location: posterior  approach of affected shoulder Equipment used: 22 gauge 1.5 inch needle Medication: 1 mL Kenalog ('40mg'$ /2m) Anesthesia: 1% Lidocaine w/o Epinephrine Cleaned and prepped: alcohol   The risks, benefits, treatment options discussed with patient prior to procedure.  Consent signed. Area cleansed with sterile betadine. Overlying skin numbed with liquid nitrogen.  Patient tolerated procedure well with no complications and no bleeding. Bandage placed at site of injection. Patient instructed on potential for steroid reaction pain within the initial 24-48hr period. May use ice packs directly on injected site as needed.

## 2021-03-14 ENCOUNTER — Other Ambulatory Visit: Payer: Self-pay

## 2021-03-14 ENCOUNTER — Ambulatory Visit: Payer: BC Managed Care – PPO | Admitting: Family Medicine

## 2021-03-14 ENCOUNTER — Encounter: Payer: Self-pay | Admitting: Family Medicine

## 2021-03-14 DIAGNOSIS — M791 Myalgia, unspecified site: Secondary | ICD-10-CM

## 2021-03-14 DIAGNOSIS — T451X5A Adverse effect of antineoplastic and immunosuppressive drugs, initial encounter: Secondary | ICD-10-CM

## 2021-03-14 DIAGNOSIS — G4700 Insomnia, unspecified: Secondary | ICD-10-CM

## 2021-03-14 DIAGNOSIS — M7541 Impingement syndrome of right shoulder: Secondary | ICD-10-CM

## 2021-03-14 DIAGNOSIS — E559 Vitamin D deficiency, unspecified: Secondary | ICD-10-CM | POA: Diagnosis not present

## 2021-03-14 DIAGNOSIS — E8881 Metabolic syndrome: Secondary | ICD-10-CM

## 2021-03-14 DIAGNOSIS — J449 Chronic obstructive pulmonary disease, unspecified: Secondary | ICD-10-CM | POA: Diagnosis not present

## 2021-03-14 DIAGNOSIS — G620 Drug-induced polyneuropathy: Secondary | ICD-10-CM

## 2021-03-14 DIAGNOSIS — K219 Gastro-esophageal reflux disease without esophagitis: Secondary | ICD-10-CM

## 2021-03-14 DIAGNOSIS — E538 Deficiency of other specified B group vitamins: Secondary | ICD-10-CM

## 2021-03-14 DIAGNOSIS — M25511 Pain in right shoulder: Secondary | ICD-10-CM

## 2021-03-14 DIAGNOSIS — I10 Essential (primary) hypertension: Secondary | ICD-10-CM

## 2021-03-14 MED ORDER — ZOLPIDEM TARTRATE 10 MG PO TABS: 10.0000 mg | ORAL_TABLET | Freq: Every day | ORAL | 0 refills | Status: DC

## 2021-03-14 MED ORDER — MELOXICAM 15 MG PO TABS: 15.0000 mg | ORAL_TABLET | Freq: Every day | ORAL | 0 refills | Status: DC

## 2021-03-14 MED ORDER — TRELEGY ELLIPTA 100-62.5-25 MCG/INH IN AEPB: 1.0000 | INHALATION_SPRAY | Freq: Every day | RESPIRATORY_TRACT | 5 refills | Status: DC

## 2021-03-14 MED ORDER — CYCLOBENZAPRINE HCL 10 MG PO TABS: 10.0000 mg | ORAL_TABLET | Freq: Three times a day (TID) | ORAL | 0 refills | Status: DC | PRN

## 2021-03-14 NOTE — Patient Instructions (Signed)
Shoulder Impingement Syndrome Rehab Ask your health care provider which exercises are safe for you. Do exercises exactly as told by your health care provider and adjust them as directed. It is normal to feel mild stretching, pulling, tightness, or discomfort as you do these exercises. Stop right away if you feel sudden pain or your pain gets worse. Do not begin these exercises until told by your health care provider. Stretching and range-of-motion exercise This exercise warms up your muscles and joints and improves the movement and flexibility of your shoulder. This exercise also helps to relieve pain andstiffness. Passive horizontal adduction In passive adduction, you use your other hand to move the injured arm toward your body. The injured arm does not move on its own. In this movement, your arm is moved across your body in the horizontal plane (horizontal adduction). Sit or stand and pull your left / right elbow across your chest, toward your other shoulder. Stop when you feel a gentle stretch in the back of your shoulder and upper arm. Keep your arm at shoulder height. Keep your arm as close to your body as you comfortably can. Hold for __________ seconds. Slowly return to the starting position. Repeat __________ times. Complete this exercise __________ times a day. Strengthening exercises These exercises build strength and endurance in your shoulder. Endurance is theability to use your muscles for a long time, even after they get tired. External rotation, isometric This is an exercise in which you press the back of your wrist against a door frame without moving your shoulder joint (isometric). Stand or sit in a doorway, facing the door frame. Bend your left / right elbow and place the back of your wrist against the door frame. Only the back of your wrist should be touching the frame. Keep your upper arm at your side. Gently press your wrist against the door frame, as if you are trying to push  your arm away from your abdomen (external rotation). Press as hard as you are able without pain. Avoid shrugging your shoulder while you press your wrist against the door frame. Keep your shoulder blade tucked down toward the middle of your back. Hold for __________ seconds. Slowly release the tension, and relax your muscles completely before you repeat the exercise. Repeat __________ times. Complete this exercise __________ times a day. Internal rotation, isometric This is an exercise in which you press your palm against a door frame without moving your shoulder joint (isometric). Stand or sit in a doorway, facing the door frame. Bend your left / right elbow and place the palm of your hand against the door frame. Only your palm should be touching the frame. Keep your upper arm at your side. Gently press your hand against the door frame, as if you are trying to push your arm toward your abdomen (internal rotation). Press as hard as you are able without pain. Avoid shrugging your shoulder while you press your hand against the door frame. Keep your shoulder blade tucked down toward the middle of your back. Hold for __________ seconds. Slowly release the tension, and relax your muscles completely before you repeat the exercise. Repeat __________ times. Complete this exercise __________ times a day. Scapular protraction, supine  Lie on your back on a firm surface (supine position). Hold a __________ weight in your left / right hand. Raise your left / right arm straight into the air so your hand is directly above your shoulder joint. Push the weight into the air so your shoulder (  scapula) lifts off the surface that you are lying on. The scapula will push up or forward (protraction). Do not move your head, neck, or back. Hold for __________ seconds. Slowly return to the starting position. Let your muscles relax completely before you repeat this exercise. Repeat __________ times. Complete this exercise  __________ times a day. Scapular retraction  Sit in a stable chair without armrests, or stand up. Secure an exercise band to a stable object in front of you so the band is at shoulder height. Hold one end of the exercise band in each hand. Your palms should face down. Squeeze your shoulder blades together (retraction) and move your elbows slightly behind you. Do not shrug your shoulders upward while you do this. Hold for __________ seconds. Slowly return to the starting position. Repeat __________ times. Complete this exercise __________ times a day. Shoulder extension  Sit in a stable chair without armrests, or stand up. Secure an exercise band to a stable object in front of you so the band is above shoulder height. Hold one end of the exercise band in each hand. Straighten your elbows and lift your hands up to shoulder height. Squeeze your shoulder blades together and pull your hands down to the sides of your thighs (extension). Stop when your hands are straight down by your sides. Do not let your hands go behind your body. Hold for __________ seconds. Slowly return to the starting position. Repeat __________ times. Complete this exercise __________ times a day. This information is not intended to replace advice given to you by your health care provider. Make sure you discuss any questions you have with your healthcare provider. Document Revised: 10/31/2018 Document Reviewed: 08/04/2018 Elsevier Patient Education  [place].

## 2021-03-16 NOTE — Progress Notes (Signed)
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  INTERVAL HISTORY: Patient returns to clinic today for routine yearly evaluation.  She currently feels well and is asymptomatic.  She continues to have mild left breast tenderness at the site of her surgery.   She has no neurologic complaints.  She denies any recent fevers or illnesses.  She has a good appetite and denies weight loss.  She denies any chest pain, shortness of breath, cough, or hemoptysis.  She denies any nausea, vomiting, constipation, or diarrhea. She has no urinary complaints.  Patient feels at her baseline offers no further specific complaints today.    REVIEW OF SYSTEMS:   Review of Systems  Constitutional: Negative.  Negative for fever, malaise/fatigue and weight loss.  Respiratory: Negative.  Negative for cough and shortness of breath.   Cardiovascular: Negative.  Negative for chest pain and leg swelling.  Gastrointestinal: Negative.  Negative for abdominal pain.  Genitourinary: Negative.  Negative for dysuria.  Musculoskeletal: Negative.  Negative for joint pain.  Skin: Negative.  Negative for rash.  Neurological: Negative.  Negative for dizziness, sensory change, weakness and headaches.  Psychiatric/Behavioral: Negative.  The patient is not nervous/anxious.    As per HPI. Otherwise, a complete review of systems is negative.  PAST MEDICAL HISTORY: Past Medical History:  Diagnosis Date   Allergy    Arthritis    Breast CA ([place])    Breast cancer ([place]) 2013   left breast lumpectomy with chemo and rad tx   COPD (chronic obstructive pulmonary disease) ([place])    Headache    occasional - thinks it is from BP meds   Hypertension     Insomnia    Metabolic syndrome    Obesity    Peripheral neuropathy    Personal history of chemotherapy    Personal history of radiation therapy     PAST SURGICAL HISTORY: Past Surgical History:  Procedure Laterality Date   BLADDER SURGERY  2007   BREAST BIOPSY Left 2013   invasive mammary carcinoma   BREAST LUMPECTOMY Left 2013   invasive mammary carcinoma with 2 positive lymph nodes. Margins were clear.    BREAST SURGERY     COLONOSCOPY WITH PROPOFOL N/A 04/04/2015   Procedure: COLONOSCOPY WITH PROPOFOL;  Surgeon: [name], MD;  Location: [facility];  Service: Endoscopy;  Laterality: N/A;   COLONOSCOPY WITH PROPOFOL N/A 07/19/2020   Procedure: COLONOSCOPY WITH PROPOFOL;  Surgeon: [name], MD;  Location: [facility] ENDOSCOPY;  Service: Endoscopy;  Laterality: N/A;   POLYPECTOMY  04/04/2015   Procedure: POLYPECTOMY;  Surgeon: [name], MD;  Location: [facility];  Service: Endoscopy;;    FAMILY HISTORY Family History  Problem Relation Age of Onset   Heart disease Mother    Diabetes Father    Hypertension Father        ADVANCED DIRECTIVES:    HEALTH MAINTENANCE: Social History   Tobacco Use   Smoking status: Every Day    Packs/day: 0.50    Years: 36.00    Pack years: 18.00    Types: Cigarettes    Start date: 06/27/1981   Smokeless tobacco: Never  Vaping Use   Vaping Use: Never used  Substance Use Topics   Alcohol  use: No    Alcohol/week: 0.0 standard drinks   Drug use: No     Allergies  Allergen Reactions   Ace Inhibitors Cough    Current Outpatient Medications  Medication Sig Dispense Refill   benzonatate (TESSALON PERLES) 100 MG capsule Take 1 capsule (100 mg total) by mouth 3 (three) times daily as needed for cough. 20 capsule 0   Cyanocobalamin (VITAMIN B-12) 1000 MCG SUBL Place 1 tablet (1,000 mcg total) under the tongue 3 (three) times a week. 100 tablet 0   cyclobenzaprine (FLEXERIL) 10 MG tablet Take 1 tablet (10 mg total)  by mouth 3 (three) times daily as needed for muscle spasms. 90 tablet 0   fluticasone (FLONASE) 50 MCG/ACT nasal spray PLACE 2 SPRAYS INTO BOTH NOSTRILS AS NEEDED. 48 mL 1   Fluticasone-Umeclidin-Vilant (TRELEGY ELLIPTA) 100-62.5-25 MCG/INH AEPB Inhale 1 puff into the lungs daily. 60 each 5   gabapentin (NEURONTIN) 300 MG capsule Take 1 capsule (300 mg total) by mouth daily. 90 capsule 1   loratadine (CLARITIN) 10 MG tablet Take 1 tablet (10 mg total) by mouth daily as needed (cough, allergies). 30 tablet 2   losartan-hydrochlorothiazide (HYZAAR) 100-12.5 MG tablet Take 1 tablet by mouth daily. 90 tablet 1   meloxicam (MOBIC) 15 MG tablet Take 1 tablet (15 mg total) by mouth daily. 30 tablet 0   omeprazole (PRILOSEC) 40 MG capsule Take 1 capsule (40 mg total) by mouth daily. 90 capsule 1   Vitamin D, Ergocalciferol, (DRISDOL) 1.25 MG (50000 UNIT) CAPS capsule Take 1 capsule (50,000 Units total) by mouth every 7 (seven) days. 12 capsule 1   zolpidem (AMBIEN) 10 MG tablet Take 1 tablet (10 mg total) by mouth at bedtime. 90 tablet 0   No current facility-administered medications for this visit.    OBJECTIVE: Vitals:   03/23/21 1055  BP: (!) 157/102  Pulse: 98  Resp: 16  Temp: 98.8 F (37.1 C)     Body mass index is 37.02 kg/m.    ECOG FS:0 - Asymptomatic  General: Well-developed, well-nourished, no acute distress. Eyes: Pink conjunctiva, anicteric sclera. HEENT: Normocephalic, moist mucous membranes. Breasts: Bilateral breast and axilla without lumps or masses. Lungs: No audible wheezing or coughing. Heart: Regular rate and rhythm. Abdomen: Soft, nontender, no obvious distention. Musculoskeletal: No edema, cyanosis, or clubbing. Neuro: Alert, answering all questions appropriately. Cranial nerves grossly intact. Skin: No rashes or petechiae noted. Psych: Normal affect.    LAB RESULTS:  Lab Results  Component Value Date   NA 141 12/14/2020   K 3.8 12/14/2020   CL 106  12/14/2020   CO2 27 12/14/2020   GLUCOSE 119 (H) 12/14/2020   BUN 11 12/14/2020   CREATININE 0.77 12/14/2020   CALCIUM 9.6 12/14/2020   PROT 6.7 12/14/2020   ALBUMIN 4.2 03/19/2016   AST 15 12/14/2020   ALT 15 12/14/2020   ALKPHOS 111 03/19/2016   BILITOT 0.3 12/14/2020   GFRNONAA 87 12/14/2020   GFRAA 101 12/14/2020    Lab Results  Component Value Date   WBC 9.1 12/14/2020   NEUTROABS 5,733 12/14/2020   HGB 12.8 12/14/2020   HCT 39.5 12/14/2020   MCV 83.5 12/14/2020   PLT 314 12/14/2020     STUDIES: No results found.   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   PLAN:    1.  Pathologic stage IIb ER/PR+, HER-2 negative carcinoma of the upper inner quadrant of the left  breast: No evidence of disease.  Patient completed 5 years of anastrozole in September 2018.  Her most recent mammogram on March 17, 2020 was reported as BI-RADS 2.  Patient rescheduled her mammogram and this is now occurring next week.  Return to clinic in 1 year for further evaluation at which point can consider discharging patient from clinic.   2. Osteopenia: Bone mineral density completed on March 17, 2020 reported T score of -1.6 which is mildly improved from June 2018 where her T score was reported -1.8.  Continue calcium and vitamin D supplementation.  Additional bone mineral density can be monitored by primary care.     3. Peripheral neuropathy: Patient does not complain of this today.   I spent a total of 20 minutes reviewing chart data, face-to-face evaluation with the patient, counseling and coordination of care as detailed above.   Patient expressed understanding and was in agreement with this plan. She also understands that She can call clinic at any time with any questions, concerns, or complaints.    Lloyd Huger, MD   03/24/2021 6:38 AM

## 2021-03-21 ENCOUNTER — Other Ambulatory Visit: Payer: Self-pay | Admitting: Family Medicine

## 2021-03-21 DIAGNOSIS — Z1231 Encounter for screening mammogram for malignant neoplasm of breast: Secondary | ICD-10-CM

## 2021-03-23 ENCOUNTER — Inpatient Hospital Stay: Payer: BC Managed Care – PPO | Attending: Oncology | Admitting: Oncology

## 2021-03-23 ENCOUNTER — Encounter: Payer: Self-pay | Admitting: Oncology

## 2021-03-23 VITALS — BP 157/102 | HR 98 | Temp 98.8°F | Resp 16 | Wt 209.0 lb

## 2021-03-23 DIAGNOSIS — F17210 Nicotine dependence, cigarettes, uncomplicated: Secondary | ICD-10-CM | POA: Insufficient documentation

## 2021-03-23 DIAGNOSIS — G629 Polyneuropathy, unspecified: Secondary | ICD-10-CM | POA: Diagnosis not present

## 2021-03-23 DIAGNOSIS — Z9221 Personal history of antineoplastic chemotherapy: Secondary | ICD-10-CM | POA: Diagnosis not present

## 2021-03-23 DIAGNOSIS — Z8249 Family history of ischemic heart disease and other diseases of the circulatory system: Secondary | ICD-10-CM | POA: Insufficient documentation

## 2021-03-23 DIAGNOSIS — Z833 Family history of diabetes mellitus: Secondary | ICD-10-CM | POA: Insufficient documentation

## 2021-03-23 DIAGNOSIS — Z853 Personal history of malignant neoplasm of breast: Secondary | ICD-10-CM

## 2021-03-23 DIAGNOSIS — C50212 Malignant neoplasm of upper-inner quadrant of left female breast: Secondary | ICD-10-CM | POA: Insufficient documentation

## 2021-03-23 DIAGNOSIS — J449 Chronic obstructive pulmonary disease, unspecified: Secondary | ICD-10-CM | POA: Diagnosis not present

## 2021-03-23 DIAGNOSIS — I10 Essential (primary) hypertension: Secondary | ICD-10-CM | POA: Diagnosis not present

## 2021-03-23 DIAGNOSIS — Z79811 Long term (current) use of aromatase inhibitors: Secondary | ICD-10-CM | POA: Diagnosis not present

## 2021-03-23 DIAGNOSIS — Z170 Estrogen receptor positive status [ER+]: Secondary | ICD-10-CM | POA: Insufficient documentation

## 2021-03-23 DIAGNOSIS — Z923 Personal history of irradiation: Secondary | ICD-10-CM | POA: Diagnosis not present

## 2021-03-23 DIAGNOSIS — M8580 Other specified disorders of bone density and structure, unspecified site: Secondary | ICD-10-CM | POA: Insufficient documentation

## 2021-03-23 DIAGNOSIS — Z79899 Other long term (current) drug therapy: Secondary | ICD-10-CM | POA: Diagnosis not present

## 2021-03-23 NOTE — Progress Notes (Signed)
Pt in for follow up, reports suppose to have mammogram next week.  Pt denies any concerns today.

## 2021-03-29 ENCOUNTER — Ambulatory Visit
Admission: RE | Admit: 2021-03-29 | Discharge: 2021-03-29 | Disposition: A | Discharge status: Home / Self Care | Payer: BC Managed Care – PPO | Source: Ambulatory Visit | Attending: Family Medicine | Admitting: Family Medicine

## 2021-03-29 ENCOUNTER — Other Ambulatory Visit: Payer: Self-pay

## 2021-03-29 DIAGNOSIS — Z1231 Encounter for screening mammogram for malignant neoplasm of breast: Secondary | ICD-10-CM | POA: Diagnosis not present

## 2021-04-15 ENCOUNTER — Other Ambulatory Visit: Payer: Self-pay | Admitting: Family Medicine

## 2021-04-15 DIAGNOSIS — M7541 Impingement syndrome of right shoulder: Secondary | ICD-10-CM

## 2021-04-15 DIAGNOSIS — M25511 Pain in right shoulder: Secondary | ICD-10-CM
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
## 2021-06-08 ENCOUNTER — Ambulatory Visit (INDEPENDENT_AMBULATORY_CARE_PROVIDER_SITE_OTHER): Payer: BC Managed Care – PPO | Admitting: Family Medicine

## 2021-06-08 ENCOUNTER — Encounter: Payer: Self-pay | Admitting: Family Medicine

## 2021-06-08 ENCOUNTER — Other Ambulatory Visit: Payer: Self-pay

## 2021-06-08 VITALS — BP 132/80 | HR 86 | Temp 98.1°F | Resp 16 | Ht 63.0 in | Wt 221.0 lb

## 2021-06-08 DIAGNOSIS — G620 Drug-induced polyneuropathy: Secondary | ICD-10-CM

## 2021-06-08 DIAGNOSIS — I10 Essential (primary) hypertension: Secondary | ICD-10-CM | POA: Diagnosis not present

## 2021-06-08 DIAGNOSIS — M791 Myalgia, unspecified site: Secondary | ICD-10-CM

## 2021-06-08 DIAGNOSIS — G4700 Insomnia, unspecified: Secondary | ICD-10-CM

## 2021-06-08 DIAGNOSIS — T451X5A Adverse effect of antineoplastic and immunosuppressive drugs, initial encounter: Secondary | ICD-10-CM

## 2021-06-08 DIAGNOSIS — Z23 Encounter for immunization: Secondary | ICD-10-CM

## 2021-06-08 DIAGNOSIS — J449 Chronic obstructive pulmonary disease, unspecified: Secondary | ICD-10-CM

## 2021-06-08 DIAGNOSIS — M5412 Radiculopathy, cervical region: Secondary | ICD-10-CM

## 2021-06-08 DIAGNOSIS — E559 Vitamin D deficiency, unspecified: Secondary | ICD-10-CM

## 2021-06-08 DIAGNOSIS — M7541 Impingement syndrome of right shoulder: Secondary | ICD-10-CM

## 2021-06-08 DIAGNOSIS — E8881 Metabolic syndrome: Secondary | ICD-10-CM

## 2021-06-08 MED ORDER — FLUTICASONE-UMECLIDIN-VILANT 100-62.5-25 MCG/ACT IN AEPB: 1.0000 | INHALATION_SPRAY | Freq: Every day | RESPIRATORY_TRACT | 5 refills | Status: DC

## 2021-06-08 MED ORDER — MELOXICAM 15 MG PO TABS: 15.0000 mg | ORAL_TABLET | Freq: Every day | ORAL | 0 refills | Status: DC

## 2021-06-08 MED ORDER — CYCLOBENZAPRINE HCL 10 MG PO TABS: 10.0000 mg | ORAL_TABLET | Freq: Three times a day (TID) | ORAL | 1 refills | Status: DC | PRN

## 2021-06-08 MED ORDER — METFORMIN HCL ER 500 MG PO TB24: 500.0000 mg | ORAL_TABLET | Freq: Every day | ORAL | 1 refills | Status: DC

## 2021-06-08 MED ORDER — ZOLPIDEM TARTRATE 10 MG PO TABS: 10.0000 mg | ORAL_TABLET | Freq: Every day | ORAL | 0 refills | Status: DC

## 2021-06-08 MED ORDER — GABAPENTIN 300 MG PO CAPS: 300.0000 mg | ORAL_CAPSULE | Freq: Every day | ORAL | 1 refills | Status: DC

## 2021-06-08 MED ORDER — VITAMIN D (ERGOCALCIFEROL) 1.25 MG (50000 UNIT) PO CAPS: 50000.0000 [IU] | ORAL_CAPSULE | ORAL | 1 refills | Status: DC

## 2021-06-08 MED ORDER — LOSARTAN POTASSIUM-HCTZ 100-12.5 MG PO TABS: 1.0000 | ORAL_TABLET | Freq: Every day | ORAL | 1 refills | Status: DC

## 2021-07-18 ENCOUNTER — Encounter: Payer: BC Managed Care – PPO | Admitting: Family Medicine
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
## 2021-09-19 ENCOUNTER — Other Ambulatory Visit: Payer: Self-pay | Admitting: Family Medicine

## 2021-09-19 DIAGNOSIS — M791 Myalgia, unspecified site: Secondary | ICD-10-CM

## 2021-10-30 ENCOUNTER — Other Ambulatory Visit: Payer: Self-pay | Admitting: Family Medicine

## 2021-10-30 DIAGNOSIS — M5412 Radiculopathy, cervical region: Secondary | ICD-10-CM

## 2021-10-30 DIAGNOSIS — M7541 Impingement syndrome of right shoulder: Secondary | ICD-10-CM

## 2021-10-30 DIAGNOSIS — G4700 Insomnia, unspecified: Secondary | ICD-10-CM

## 2021-10-31 ENCOUNTER — Other Ambulatory Visit: Payer: Self-pay

## 2021-10-31 DIAGNOSIS — M5412 Radiculopathy, cervical region: Secondary | ICD-10-CM

## 2021-10-31 DIAGNOSIS — G4700 Insomnia, unspecified: Secondary | ICD-10-CM

## 2021-10-31 DIAGNOSIS — M7541 Impingement syndrome of right shoulder: Secondary | ICD-10-CM
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
## 2021-11-08 ENCOUNTER — Encounter: Payer: Self-pay | Admitting: Family Medicine

## 2021-11-08 ENCOUNTER — Ambulatory Visit: Payer: BC Managed Care – PPO | Admitting: Family Medicine

## 2021-11-08 VITALS — BP 136/84 | HR 93 | Resp 16 | Ht 64.0 in | Wt 219.0 lb

## 2021-11-08 DIAGNOSIS — E559 Vitamin D deficiency, unspecified: Secondary | ICD-10-CM

## 2021-11-08 DIAGNOSIS — E785 Hyperlipidemia, unspecified: Secondary | ICD-10-CM | POA: Diagnosis not present

## 2021-11-08 DIAGNOSIS — M791 Myalgia, unspecified site: Secondary | ICD-10-CM

## 2021-11-08 DIAGNOSIS — E538 Deficiency of other specified B group vitamins: Secondary | ICD-10-CM

## 2021-11-08 DIAGNOSIS — T451X5A Adverse effect of antineoplastic and immunosuppressive drugs, initial encounter: Secondary | ICD-10-CM

## 2021-11-08 DIAGNOSIS — J449 Chronic obstructive pulmonary disease, unspecified: Secondary | ICD-10-CM

## 2021-11-08 DIAGNOSIS — E8881 Metabolic syndrome: Secondary | ICD-10-CM

## 2021-11-08 DIAGNOSIS — K219 Gastro-esophageal reflux disease without esophagitis: Secondary | ICD-10-CM

## 2021-11-08 DIAGNOSIS — J302 Other seasonal allergic rhinitis: Secondary | ICD-10-CM

## 2021-11-08 DIAGNOSIS — I10 Essential (primary) hypertension: Secondary | ICD-10-CM | POA: Diagnosis not present

## 2021-11-08 DIAGNOSIS — M7541 Impingement syndrome of right shoulder: Secondary | ICD-10-CM

## 2021-11-08 DIAGNOSIS — G620 Drug-induced polyneuropathy: Secondary | ICD-10-CM

## 2021-11-08 MED ORDER — FLUTICASONE-UMECLIDIN-VILANT 100-62.5-25 MCG/ACT IN AEPB: 1.0000 | INHALATION_SPRAY | Freq: Every day | RESPIRATORY_TRACT | 5 refills | Status: DC

## 2021-11-08 MED ORDER — GABAPENTIN 300 MG PO CAPS: 300.0000 mg | ORAL_CAPSULE | Freq: Two times a day (BID) | ORAL | 1 refills | Status: DC

## 2021-11-08 MED ORDER — LOSARTAN POTASSIUM-HCTZ 100-12.5 MG PO TABS: 1.0000 | ORAL_TABLET | Freq: Every day | ORAL | 1 refills | Status: DC

## 2021-11-08 MED ORDER — METFORMIN HCL ER 500 MG PO TB24: 500.0000 mg | ORAL_TABLET | Freq: Every day | ORAL | 1 refills | Status: DC

## 2021-11-08 MED ORDER — VITAMIN D (ERGOCALCIFEROL) 1.25 MG (50000 UNIT) PO CAPS: 50000.0000 [IU] | ORAL_CAPSULE | ORAL | 1 refills | Status: DC

## 2021-11-09 LAB — LIPID PANEL
Cholesterol: 153 mg/dL (ref <200)
HDL: 47 mg/dL — ABNORMAL LOW (ref >=50)
LDL Cholesterol (Calc): 89 mg/dL (calc)
Non-HDL Cholesterol (Calc): 106 mg/dL (calc) (ref <130)
Total CHOL/HDL Ratio: 3.3 (calc) (ref <5.0)
Triglycerides: 79 mg/dL (ref <150)

## 2021-11-09 LAB — B12 AND FOLATE PANEL
Folate: 8 ng/mL
Vitamin B-12: 387 pg/mL (ref 200–1100)

## 2021-11-09 LAB — CBC WITH DIFFERENTIAL/PLATELET
Absolute Monocytes: 434 cells/uL (ref 200–950)
Basophils Absolute: 51 cells/uL (ref 0–200)
Basophils Relative: 0.6 %
Eosinophils Absolute: 102 cells/uL (ref 15–500)
Eosinophils Relative: 1.2 %
HCT: 40.8 % (ref 35.0–45.0)
Hemoglobin: 13.3 g/dL (ref 11.7–15.5)
Lymphs Abs: 3188 cells/uL (ref 850–3900)
MCH: 27.3 pg (ref 27.0–33.0)
MCHC: 32.6 g/dL (ref 32.0–36.0)
MCV: 83.8 fL (ref 80.0–100.0)
MPV: 10.9 fL (ref 7.5–12.5)
Monocytes Relative: 5.1 %
Neutro Abs: 4726 cells/uL (ref 1500–7800)
Neutrophils Relative %: 55.6 %
Platelets: 342 10*3/uL (ref 140–400)
RBC: 4.87 10*6/uL (ref 3.80–5.10)
RDW: 14 % (ref 11.0–15.0)
Total Lymphocyte: 37.5 %
WBC: 8.5 10*3/uL (ref 3.8–10.8)

## 2021-11-09 LAB — COMPLETE METABOLIC PANEL WITH GFR
AG Ratio: 1.3 (calc) (ref 1.0–2.5)
ALT: 14 U/L (ref 6–29)
AST: 16 U/L (ref 10–35)
Albumin: 4.1 g/dL (ref 3.6–5.1)
Alkaline phosphatase (APISO): 79 U/L (ref 37–153)
BUN: 9 mg/dL (ref 7–25)
CO2: 26 mmol/L (ref 20–32)
Calcium: 9.6 mg/dL (ref 8.6–10.4)
Chloride: 103 mmol/L (ref 98–110)
Creat: 0.77 mg/dL (ref 0.50–1.03)
Globulin: 3.1 g/dL (calc) (ref 1.9–3.7)
Glucose, Bld: 85 mg/dL (ref 65–99)
Potassium: 4 mmol/L (ref 3.5–5.3)
Sodium: 138 mmol/L (ref 135–146)
Total Bilirubin: 0.6 mg/dL (ref 0.2–1.2)
Total Protein: 7.2 g/dL (ref 6.1–8.1)
eGFR: 90 mL/min/{1.73_m2} (ref >=60)

## 2021-11-09 LAB — HEMOGLOBIN A1C
Hgb A1c MFr Bld: 6.2 % of total Hgb — ABNORMAL HIGH (ref <5.7)
Mean Plasma Glucose: 131 mg/dL
eAG (mmol/L): 7.3 mmol/L

## 2021-11-09 LAB — VITAMIN D 25 HYDROXY (VIT D DEFICIENCY, FRACTURES): Vit D, 25-Hydroxy: 51 ng/mL (ref 30–100)

## 2021-11-13 ENCOUNTER — Other Ambulatory Visit: Payer: Self-pay | Admitting: Family Medicine

## 2021-11-13 MED ORDER — ROSUVASTATIN CALCIUM 10 MG PO TABS: 10.0000 mg | ORAL_TABLET | Freq: Every day | ORAL | 0 refills | Status: DC

## 2021-11-28 NOTE — Progress Notes (Signed)
Name: Brenda Jones   MRN: [id number]    DOB: 10-Jul-1966   Date:11/29/2021 ? ?     Progress Note ? ?Subjective ? ?Chief Complaint ? ?Annual Exam ? ?HPI ? ?Patient presents for annual CPE. ? ?Diet: she does not cook at home, eats a lot sandwiches and eggs, she also eats fruit ?Exercise: discussed importance of regular physical activity   ? ?Flowsheet Row Video Visit from [facility]/08/2021 in [facility]  ?AUDIT-C Score 0  ? ?  ? ?Depression: Phq 9 is  negative ? ?  11/29/2021  ?  8:30 AM 11/08/2021  ?  7:34 AM 06/08/2021  ?  8:07 AM 03/14/2021  ?  7:32 AM 02/08/2021  ?  1:39 PM  ?Depression screen PHQ 2/9  ?Decreased Interest 0 0 0 0 0  ?Down, Depressed, Hopeless 0 0 0 0 0  ?PHQ - 2 Score 0 0 0 0 0  ?Altered sleeping 0 0 0    ?Tired, decreased energy 0 0 0    ?Change in appetite 0 0 0    ?Feeling bad or failure about yourself  0 0 0    ?Trouble concentrating 0 0 0    ?Moving slowly or fidgety/restless 0 0 0    ?Suicidal thoughts 0 0 0    ?PHQ-9 Score 0 0 0    ? ?Hypertension: ?BP Readings from Last 3 Encounters:  ?11/29/21 132/82  ?11/08/21 136/84  ?06/08/21 132/80  ? ?Obesity: ?Wt Readings from Last 3 Encounters:  ?11/29/21 219 lb (99.3 kg)  ?11/08/21 219 lb (99.3 kg)  ?06/08/21 221 lb (100.2 kg)  ? ?BMI Readings from Last 3 Encounters:  ?11/29/21 37.59 kg/m?  ?11/08/21 37.59 kg/m?  ?06/08/21 39.15 kg/m?  ?  ? ?Vaccines:  ? ?HPV: N/A ?Tdap: up to date ?Shingrix: up to date ?Pneumonia: up to date  ?Flu: up to date ?COVID-19: refused  ? ? ?Hep C Screening: 12/09/19 ?STD testing and prevention (HIV/chl/gon/syphilis): 03/19/16 ?Intimate partner violence: negative screen  ?Sexual History : not sexually active in the past 8 years, we will hold off to repeat every 5 years  ?Menstrual History/LMP/Abnormal Bleeding: post menopausal  ?Discussed importance of follow up if any post-menopausal bleeding: yes  ?Incontinence Symptoms: positive for urgency symptoms  ? ?Breast cancer:  ?- Last Mammogram: 03/29/21 ?- BRCA gene  screening: N/A ? ?Osteoporosis Prevention : Discussed high calcium and vitamin D supplementation, weight bearing exercises ?Bone density : 03/17/20 showed osteopenia with Low FRAX score repeat in 2025 or 2026   ? ?Cervical cancer screening: repeat in 5 years  ? ?Skin cancer: Discussed monitoring for atypical lesions  ?Colorectal cancer: 07/19/20   ?Lung cancer:  Low Dose CT Chest recommended if Age 57-80 years, 20 pack-year currently smoking OR have quit w/in 15years. Patient does  qualify for screen  but she is not interested at this time  ?ECG: 08/30/11 ? ?Advanced Care Planning: A voluntary discussion about advance care planning including the explanation and discussion of advance directives.  Discussed health care proxy and Living will, and the patient was able to identify a health care proxy as son .  Patient does not have a living will and power of attorney of health care  ? ?Lipids: ?Lab Results  ?Component Value Date  ? CHOL 153 11/08/2021  ? CHOL 138 12/14/2020  ? CHOL 147 12/09/2019  ? ?Lab Results  ?Component Value Date  ? HDL 47 (L) 11/08/2021  ? HDL 45 (L) 12/14/2020  ? HDL 45 (L)  12/09/2019  ? ?Lab Results  ?Component Value Date  ? [place] 89 11/08/2021  ? [place] 76 12/14/2020  ? [place] 86 12/09/2019  ? ?Lab Results  ?Component Value Date  ? TRIG 79 11/08/2021  ? TRIG 86 12/14/2020  ? TRIG 69 12/09/2019  ? ?Lab Results  ?Component Value Date  ? CHOLHDL 3.3 11/08/2021  ? CHOLHDL 3.1 12/14/2020  ? CHOLHDL 3.3 12/09/2019  ? ?No results found for: LDLDIRECT ? ?Glucose: ?Glucose  ?Date Value Ref Range Status  ?01/25/2012 148 (H) 65 - 99 mg/dL Final  ?01/17/2012 124 (H) 65 - 99 mg/dL Final  ?01/11/2012 116 (H) 65 - 99 mg/dL Final  ? ?Glucose, Bld  ?Date Value Ref Range Status  ?11/08/2021 85 65 - 99 mg/dL Final  ?  Comment:  ?  . ?           Fasting reference interval ?. ?  ?12/14/2020 119 (H) 65 - 99 mg/dL Final  ?  Comment:  ?  . ?           Fasting reference interval ?. ?For someone without known diabetes,  a glucose value ?between 100 and 125 mg/dL is consistent with ?prediabetes and should be confirmed with a ?follow-up test. ?. ?  ?12/09/2019 84 65 - 99 mg/dL Final  ?  Comment:  ?  . ?           Fasting reference interval ?. ?  ? ? ?Patient Active Problem List  ? Diagnosis Date Noted  ? History of colonic polyps   ? Polyp of transverse colon   ? Polyp of sigmoid colon   ? History of breast cancer 06/24/2019  ? Morbid obesity ([place]) 03/12/2018  ? Osteoarthritis of right knee 08/29/2016  ? Osteopenia due to cancer therapy 04/18/2015  ? GERD (gastroesophageal reflux disease) 04/18/2015  ? Muscle spasm 04/18/2015  ? Benign neoplasm of descending colon   ? Cervical high risk HPV (human papillomavirus) test positive 03/22/2015  ? Benign essential HTN 01/08/2015  ? Insomnia, persistent 01/08/2015  ? COPD, mild ([place]) 01/08/2015  ? HLD (hyperlipidemia) 01/08/2015  ? Dysmetabolic syndrome 29/52/8413  ? Morbid obesity ([place]) 01/08/2015  ? Seasonal allergic rhinitis 01/08/2015  ? Neuropathy due to chemotherapeutic drug ([place]) 01/08/2015  ? Arthralgia of multiple joints 01/08/2015  ? ? ?Past Surgical History:  ?Procedure Laterality Date  ? BLADDER SURGERY  2007  ? BREAST BIOPSY Left 2013  ? invasive mammary carcinoma  ? BREAST LUMPECTOMY Left 2013  ? invasive mammary carcinoma with 2 positive lymph nodes. Margins were clear.   ? BREAST SURGERY    ? COLONOSCOPY WITH PROPOFOL N/A 04/04/2015  ? Procedure: COLONOSCOPY WITH PROPOFOL;  Surgeon: [name], MD;  Location: [place];  Service: Endoscopy;  Laterality: N/A;  ? COLONOSCOPY WITH PROPOFOL N/A 07/19/2020  ? Procedure: COLONOSCOPY WITH PROPOFOL;  Surgeon: [name], MD;  Location: [facility] ENDOSCOPY;  Service: Endoscopy;  Laterality: N/A;  ? POLYPECTOMY  04/04/2015  ? Procedure: POLYPECTOMY;  Surgeon: [name], MD;  Location: [place];  Service: Endoscopy;;  ? ? ?Family History  ?Problem Relation Age of Onset  ? Heart disease Mother   ? Diabetes Father   ?  Hypertension Father   ? Breast cancer Neg Hx   ? ? ?Social History  ? ?Socioeconomic History  ? Marital status: Single  ?  Spouse name: Not on file  ? Number of children: 1  ? Years of education: Not on file  ? Highest  education level: 12th grade  ?Occupational History  ? Occupation: Radio producer.   ?Tobacco Use  ? Smoking status: Every Day  ?  Packs/day: 0.50  ?  Years: 36.00  ?  Pack years: 18.00  ?  Types: Cigarettes  ?  Start date: 06/27/1981  ? Smokeless tobacco: Never  ?Vaping Use  ? Vaping Use: Never used  ?Substance and Sexual Activity  ? Alcohol use: No  ?  Alcohol/week: 0.0 standard drinks  ? Drug use: No  ? Sexual activity: Not Currently  ?  Partners: Male  ?Other Topics Concern  ? Not on file  ?Social History Narrative  ? ** Merged History Encounter **  ?    ? She has one son, still lives at home, he helps her out financially also.   ? ?Social Determinants of Health  ? ?Financial Resource Strain: Low Risk   ? Difficulty of Paying Living Expenses: Not hard at all  ?Food Insecurity: No Food Insecurity  ? Worried About Charity fundraiser in the Last Year: Never true  ? Ran Out of Food in the Last Year: Never true  ?Transportation Needs: No Transportation Needs  ? Lack of Transportation (Medical): No  ? Lack of Transportation (Non-Medical): No  ?Physical Activity: Inactive  ? Days of Exercise per Week: 0 days  ? Minutes of Exercise per Session: 0 min  ?Stress: No Stress Concern Present  ? Feeling of Stress : Not at all  ?Social Connections: Moderately Integrated  ? Frequency of Communication with Friends and Family: More than three times a week  ? Frequency of Social Gatherings with Friends and Family: Twice a week  ? Attends Religious Services: More than 4 times per year  ? Active Member of Clubs or Organizations: Yes  ? Attends Archivist Meetings: More than 4 times per year  ? Marital Status: Never married  ?Intimate Partner Violence: Not At Risk  ? Fear of Current or Ex-Partner: No  ?  Emotionally Abused: No  ? Physically Abused: No  ? Sexually Abused: No  ? ? ? ?Current Outpatient Medications:  ?  Cyanocobalamin (VITAMIN B-12) 1000 MCG SUBL, Place 1 tablet (1,000 mcg total) under the tongue

## 2021-11-28 NOTE — Patient Instructions (Signed)

## 2021-11-29 ENCOUNTER — Other Ambulatory Visit: Payer: Self-pay

## 2021-11-29 ENCOUNTER — Encounter: Payer: Self-pay | Admitting: Family Medicine

## 2021-11-29 ENCOUNTER — Ambulatory Visit (INDEPENDENT_AMBULATORY_CARE_PROVIDER_SITE_OTHER): Payer: BC Managed Care – PPO | Admitting: Family Medicine

## 2021-11-29 VITALS — BP 132/82 | HR 95 | Resp 16 | Ht 64.0 in | Wt 219.0 lb

## 2021-11-29 DIAGNOSIS — Z853 Personal history of malignant neoplasm of breast: Secondary | ICD-10-CM

## 2021-11-29 DIAGNOSIS — Z124 Encounter for screening for malignant neoplasm of cervix: Secondary | ICD-10-CM

## 2021-11-29 DIAGNOSIS — Z0000 Encounter for general adult medical examination without abnormal findings: Secondary | ICD-10-CM | POA: Diagnosis not present

## 2021-11-29 DIAGNOSIS — Z23 Encounter for immunization: Secondary | ICD-10-CM

## 2021-11-29 MED ORDER — ROSUVASTATIN CALCIUM 10 MG PO TABS: 10.0000 mg | ORAL_TABLET | Freq: Every day | ORAL | 0 refills | Status: DC

## 2022-02-20 ENCOUNTER — Other Ambulatory Visit: Payer: Self-pay | Admitting: Family Medicine

## 2022-02-20 DIAGNOSIS — Z1231 Encounter for screening mammogram for malignant neoplasm of breast: Secondary | ICD-10-CM

## 2022-03-12 ENCOUNTER — Other Ambulatory Visit: Payer: Self-pay | Admitting: Internal Medicine

## 2022-03-12 DIAGNOSIS — G4700 Insomnia, unspecified: Secondary | ICD-10-CM

## 2022-03-13 NOTE — Telephone Encounter (Signed)
Requested medication (s) are due for refill today - yes   Requested medication (s) are on the active medication list -yes  Future visit scheduled -yes  Last refill: 12/11/21 #90  Notes to clinic: non delegated Rx  Requested Prescriptions  Pending Prescriptions Disp Refills   zolpidem (AMBIEN) 10 MG tablet [Pharmacy Med Name: ZOLPIDEM TARTRATE 10 MG TABLET] 90 tablet 0    Sig: TAKE 1 TABLET BY MOUTH EVERYDAY AT BEDTIME     Not Delegated - Psychiatry:  Anxiolytics/Hypnotics Failed - 03/12/2022  4:29 PM      Failed - This refill cannot be delegated      Failed - Urine Drug Screen completed in last 360 days      Passed - Valid encounter within last 6 months    Recent Outpatient Visits           3 months ago Well adult exam   [facility] Steele Sizer, MD   4 months ago Benign essential HTN   [facility], Drue Stager, MD   9 months ago Peripheral neuropathy due to chemotherapy [facility])   [facility] Steele Sizer, MD   12 months ago Morbid obesity [facility])   [facility] Steele Sizer, MD   1 year ago Cough   [place], DO       Future Appointments             In 1 month Steele Sizer, MD [facility], [place]   In 8 months Steele Sizer, MD [facility], [facility]               Requested Prescriptions  Pending Prescriptions Disp Refills   zolpidem (AMBIEN) 10 MG tablet [Pharmacy Med Name: ZOLPIDEM TARTRATE 10 MG TABLET] 90 tablet 0    Sig: TAKE 1 TABLET BY MOUTH EVERYDAY AT BEDTIME     Not Delegated - Psychiatry:  Anxiolytics/Hypnotics Failed - 03/12/2022  4:29 PM      Failed - This refill cannot be delegated      Failed - Urine Drug Screen completed in last 360 days      Passed - Valid encounter within last 6 months    Recent Outpatient Visits           3 months ago Well adult exam   [facility] Steele Sizer, MD   4 months ago Benign essential HTN   [facility], Drue Stager, MD   9 months ago Peripheral neuropathy due to chemotherapy [facility])   [facility] Steele Sizer, MD   12 months ago Morbid obesity [facility])   [facility] Steele Sizer, MD   1 year ago Cough   [place], DO       Future Appointments             In 1 month Ancil Boozer, Drue Stager, MD [facility], [place]   In 8 months Steele Sizer, MD [facility], [facility]

## 2022-04-02 ENCOUNTER — Ambulatory Visit
Admission: RE | Admit: 2022-04-02 | Discharge: 2022-04-02 | Disposition: A | Discharge status: Home / Self Care | Payer: BC Managed Care – PPO | Source: Ambulatory Visit | Attending: Family Medicine | Admitting: Family Medicine

## 2022-04-02 DIAGNOSIS — Z1231 Encounter for screening mammogram for malignant neoplasm of breast: Secondary | ICD-10-CM | POA: Insufficient documentation
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
## 2022-05-10 ENCOUNTER — Ambulatory Visit: Payer: BC Managed Care – PPO | Admitting: Family Medicine

## 2022-05-10 ENCOUNTER — Encounter: Payer: Self-pay | Admitting: Family Medicine

## 2022-05-10 VITALS — BP 130/74 | HR 92 | Resp 16 | Ht 64.0 in | Wt 216.0 lb

## 2022-05-10 DIAGNOSIS — E785 Hyperlipidemia, unspecified: Secondary | ICD-10-CM

## 2022-05-10 DIAGNOSIS — Z23 Encounter for immunization: Secondary | ICD-10-CM

## 2022-05-10 DIAGNOSIS — I10 Essential (primary) hypertension: Secondary | ICD-10-CM

## 2022-05-10 DIAGNOSIS — E559 Vitamin D deficiency, unspecified: Secondary | ICD-10-CM

## 2022-05-10 DIAGNOSIS — E8881 Metabolic syndrome: Secondary | ICD-10-CM

## 2022-05-10 DIAGNOSIS — K219 Gastro-esophageal reflux disease without esophagitis: Secondary | ICD-10-CM

## 2022-05-10 DIAGNOSIS — J449 Chronic obstructive pulmonary disease, unspecified: Secondary | ICD-10-CM | POA: Diagnosis not present

## 2022-05-10 DIAGNOSIS — T451X5A Adverse effect of antineoplastic and immunosuppressive drugs, initial encounter: Secondary | ICD-10-CM

## 2022-05-10 DIAGNOSIS — G620 Drug-induced polyneuropathy: Secondary | ICD-10-CM

## 2022-05-10 DIAGNOSIS — M791 Myalgia, unspecified site: Secondary | ICD-10-CM

## 2022-05-10 DIAGNOSIS — G4700 Insomnia, unspecified: Secondary | ICD-10-CM

## 2022-05-10 MED ORDER — ROSUVASTATIN CALCIUM 10 MG PO TABS: 10.0000 mg | ORAL_TABLET | Freq: Every day | ORAL | 1 refills | Status: DC

## 2022-05-10 MED ORDER — METFORMIN HCL ER 500 MG PO TB24: 500.0000 mg | ORAL_TABLET | Freq: Every day | ORAL | 1 refills | Status: DC

## 2022-05-10 MED ORDER — GABAPENTIN 300 MG PO CAPS: 300.0000 mg | ORAL_CAPSULE | Freq: Two times a day (BID) | ORAL | 1 refills | Status: DC

## 2022-05-10 MED ORDER — VALSARTAN-HYDROCHLOROTHIAZIDE 160-12.5 MG PO TABS: 1.0000 | ORAL_TABLET | Freq: Every day | ORAL | 1 refills | Status: DC

## 2022-05-10 MED ORDER — CYCLOBENZAPRINE HCL 10 MG PO TABS: ORAL_TABLET | ORAL | 0 refills | Status: DC

## 2022-05-10 MED ORDER — ZOLPIDEM TARTRATE 10 MG PO TABS
10.0000 mg | ORAL_TABLET | Freq: Every day | ORAL | 0 refills | Status: DC
Start: 2022-05-10 — End: 2022-11-09

## 2022-05-10 MED ORDER — FLUTICASONE-UMECLIDIN-VILANT 100-62.5-25 MCG/ACT IN AEPB: 1.0000 | INHALATION_SPRAY | Freq: Every day | RESPIRATORY_TRACT | 5 refills | Status: DC

## 2022-06-09 ENCOUNTER — Other Ambulatory Visit: Payer: Self-pay | Admitting: Family Medicine

## 2022-06-09 DIAGNOSIS — I10 Essential (primary) hypertension: Secondary | ICD-10-CM

## 2022-07-18 ENCOUNTER — Other Ambulatory Visit: Payer: Self-pay | Admitting: Family Medicine

## 2022-07-18 DIAGNOSIS — E559 Vitamin D deficiency, unspecified: Secondary | ICD-10-CM

## 2022-09-04 ENCOUNTER — Other Ambulatory Visit: Payer: Self-pay | Admitting: Family Medicine

## 2022-09-04 DIAGNOSIS — M791 Myalgia, unspecified site: Secondary | ICD-10-CM

## 2022-09-27 ENCOUNTER — Other Ambulatory Visit: Payer: Self-pay | Admitting: Family Medicine

## 2022-09-27 DIAGNOSIS — I10 Essential (primary) hypertension: Secondary | ICD-10-CM

## 2022-11-08 NOTE — Progress Notes (Signed)
Name: Brenda Jones   MRN: [id number]    DOB: [birth year]   Date:11/09/2022       Progress Note  Subjective  Chief Complaint  Follow Up  HPI  HTN: taking bp medication daily, now on valsartan  hctz,  she continues to have  cramping since chemo back in 2013 but gradually improving . No chest pain, palpitation or dizziness,  she states ankle swelling has improved, SBP still in the 130's and we will adjust dose today    GERD: seen by Dr. Lars Pinks, history of h. Pylori, still taking Omeprazole prn only now.  She is cutting down on smoking, symptoms improved    Pre-diabetes: on Metformin, last A1C was 6.2 % she denies polyphagia, polydipsia or polyuria.  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 Used to see Dr. Orlie Dakin, currently taking vitamin D rx, she had repeat bone density im 02/2020 and it showed mild progression at spine level, femur has been stable Continue vitamin D and calcium supplementation  She agrees on having a repeat study this year when she goes for another mammogram    Insomnia: working 3 am to 3 pm taking Ambien around as soon as she gets home. She is able to sleep 6 hours with medication. Denies side effects and needs a refill.    COPD: She quit smoking for about one month but resumed after father died Oct 30, 2017. She started smoking at age 49 , currently smoking only a few cigarettes a day and trying to quit again  . She states no longer coughing daily, denies sob or wheezing. Taking Trelegy prn now    Muscles Spasms: got much worse after chemotherapy  - in 2013 and takes Cyclobenzaprine to control symptoms, usually in the evening when she gets home from work . It can be on her trunk or legs, she has tried magnesium and co Q 10 without improvement of symptoms, she takes medication every night and is stable.   Osteopenia: spine after cancer therapy , we will recheck study this year    History of breast cancer left:s/p lumpectomy she completed  5 years of Anastrozole 03/2017. Last  Mammogram was done in 2023, she saw Dr. Orlie Dakin in 2023   Peripheral Neuropathy due to chemotherapy : still has daily pain on both legs and feet also also tips of fingers.  Lyrica did not work for her, Gabapentin no longer making her feel very drowsy but still has pain daily 5/10 and stable . Discussed increasing Gabapentin to three times daily    Morbid Obesity : BMI 35 with co-morbidities- OA, HTN , GERD, Metabolic syndrome. Discussed importance of stopping sodas   B12 Deficiency and vitamin D deficiency: continue supplements , we will recheck labs today   Left knee pain: she states she feel at work one month ago and told her supervisor but never saw a Ecologist comp provider. She continues to have to wear a brace on left knee due to pain. Explained since it happened at work she needs to contact her supervisor or HR and see a Forensic psychologist comp provider    Patient Active Problem List   Diagnosis Date Noted   Vitamin D deficiency 05/10/2022   History of colonic polyps    Polyp of transverse colon    Polyp of sigmoid colon    History of breast cancer 06/24/2019   Morbid obesity 03/12/2018   Osteoarthritis of right knee 08/29/2016   Osteopenia due to cancer therapy  04/18/2015   GERD (gastroesophageal reflux disease) 04/18/2015   Muscle spasm 04/18/2015   Benign neoplasm of descending colon    Cervical high risk HPV (human papillomavirus) test positive 03/22/2015   Benign essential HTN 01/08/2015   Insomnia, persistent 01/08/2015   COPD, mild 01/08/2015   Dyslipidemia 01/08/2015   Dysmetabolic syndrome 01/08/2015   Morbid obesity 01/08/2015   Seasonal allergic rhinitis 01/08/2015   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   Arthralgia of multiple joints 01/08/2015    Past Surgical History:  Procedure Laterality Date   BLADDER SURGERY  2007   BREAST BIOPSY Left 2013   invasive mammary carcinoma   BREAST LUMPECTOMY Left 2013    invasive mammary carcinoma with 2 positive lymph nodes. Margins were clear.    BREAST SURGERY     COLONOSCOPY WITH PROPOFOL N/A 04/04/2015   Procedure: COLONOSCOPY WITH PROPOFOL;  Surgeon: Midge Minium, MD;  Location: [facility];  Service: Endoscopy;  Laterality: N/A;   COLONOSCOPY WITH PROPOFOL N/A 07/19/2020   Procedure: COLONOSCOPY WITH PROPOFOL;  Surgeon: Midge Minium, MD;  Location: [facility] ENDOSCOPY;  Service: Endoscopy;  Laterality: N/A;   POLYPECTOMY  04/04/2015   Procedure: POLYPECTOMY;  Surgeon: Midge Minium, MD;  Location: [facility];  Service: Endoscopy;;    Family History  Problem Relation Age of Onset   Heart disease Mother    Diabetes Father    Hypertension Father    Breast cancer Neg Hx     Social History   Tobacco Use   Smoking status: Every Day    Packs/day: 0.75    Years: 36.00    Additional pack years: 0.00    Total pack years: 27.00    Types: Cigarettes    Start date: 06/27/1981   Smokeless tobacco: Never  Substance Use Topics   Alcohol use: No    Alcohol/week: 0.0 standard drinks of alcohol     Current Outpatient Medications:    Cyanocobalamin (VITAMIN B-12) 1000 MCG SUBL, Place 1 tablet (1,000 mcg total) under the tongue 3 (three) times a week., Disp: 100 tablet, Rfl: 0   cyclobenzaprine (FLEXERIL) 10 MG tablet, TAKE 1 TABLET BY MOUTH THREE TIMES A DAY AS NEEDED FOR MUSCLE SPASMS, Disp: 90 tablet, Rfl: 0   fluticasone (FLONASE) 50 MCG/ACT nasal spray, PLACE 2 SPRAYS INTO BOTH NOSTRILS AS NEEDED., Disp: 48 mL, Rfl: 1   Fluticasone-Umeclidin-Vilant (TRELEGY ELLIPTA) 100-62.5-25 MCG/ACT AEPB, Inhale 1 puff into the lungs daily., Disp: 60 each, Rfl: 5   gabapentin (NEURONTIN) 300 MG capsule, Take 1 capsule (300 mg total) by mouth 2 (two) times daily., Disp: 180 capsule, Rfl: 1   loratadine (CLARITIN) 10 MG tablet, Take 1 tablet (10 mg total) by mouth daily as needed (cough, allergies)., Disp: 30 tablet, Rfl: 2   meloxicam (MOBIC) 15 MG tablet,  TAKE 1 TABLET (15 MG TOTAL) BY MOUTH DAILY., Disp: 90 tablet, Rfl: 0   metFORMIN (GLUCOPHAGE XR) 500 MG 24 hr tablet, Take 1 tablet (500 mg total) by mouth daily with breakfast., Disp: 90 tablet, Rfl: 1   omeprazole (PRILOSEC) 40 MG capsule, Take 1 capsule (40 mg total) by mouth daily., Disp: 90 capsule, Rfl: 1   rosuvastatin (CRESTOR) 10 MG tablet, Take 1 tablet (10 mg total) by mouth daily., Disp: 90 tablet, Rfl: 1   valsartan-hydrochlorothiazide (DIOVAN-HCT) 160-12.5 MG tablet, Take 1 tablet by mouth daily. For BP, Disp: 90 tablet, Rfl: 1   Vitamin D, Ergocalciferol, (DRISDOL) 1.25 MG (50000 UNIT) CAPS capsule, TAKE 1 CAPSULE (  50,000 UNITS TOTAL) BY MOUTH EVERY 7 (SEVEN) DAYS, Disp: 12 capsule, Rfl: 1   zolpidem (AMBIEN) 10 MG tablet, Take 1 tablet (10 mg total) by mouth at bedtime., Disp: 90 tablet, Rfl: 0  Allergies  Allergen Reactions   Ace Inhibitors Cough    I personally reviewed active problem list, medication list, allergies, family history, social history, health maintenance with the patient/caregiver today.   ROS  Constitutional: Negative for fever or weight change.  Respiratory: Negative for cough and shortness of breath.   Cardiovascular: Negative for chest pain or palpitations.  Gastrointestinal: Negative for abdominal pain, no bowel changes.  Musculoskeletal: positive for gait problem and mild left knee  joint swelling.  Skin: Negative for rash.  Neurological: Negative for dizziness , sometimes has nuchal pain that resolves with otc medications  headache.  No other specific complaints in a complete review of systems (except as listed in HPI above).   Objective  Vitals:   11/09/22 0740  BP: 138/86  Pulse: 91  Resp: 16  SpO2: 99%  Weight: 216 lb (98 kg)  Height: 5\' 4"  (1.626 m)    Body mass index is 37.08 kg/m.  Physical Exam  Constitutional: Patient appears well-developed and well-nourished. Obese  No distress.  HEENT: head atraumatic, normocephalic, pupils  equal and reactive to light, neck supple Cardiovascular: Normal rate, regular rhythm and normal heart sounds.  No murmur heard. No BLE edema. Pulmonary/Chest: Effort normal and breath sounds normal. No respiratory distress. Abdominal: Soft.  There is no tenderness. Psychiatric: Patient has a normal mood and affect. behavior is normal. Judgment and thought content normal.   PHQ2/9:    11/09/2022    7:39 AM 05/10/2022    7:39 AM 11/29/2021    8:30 AM 11/08/2021    7:34 AM 06/08/2021    8:07 AM  Depression screen PHQ 2/9  Decreased Interest 0 0 0 0 0  Down, Depressed, Hopeless 0 0 0 0 0  PHQ - 2 Score 0 0 0 0 0  Altered sleeping 0 0 0 0 0  Tired, decreased energy 0 0 0 0 0  Change in appetite 0 0 0 0 0  Feeling bad or failure about yourself  0 0 0 0 0  Trouble concentrating 0 0 0 0 0  Moving slowly or fidgety/restless 0 0 0 0 0  Suicidal thoughts 0 0 0 0 0  PHQ-9 Score 0 0 0 0 0    phq 9 is negative   Fall Risk:    11/09/2022    7:39 AM 05/10/2022    7:39 AM 11/29/2021    8:29 AM 11/08/2021    7:34 AM 06/08/2021    8:07 AM  Fall Risk   Falls in the past year? 1 0 0 0 0  Number falls in past yr: 0 0 0 0 0  Injury with Fall? 0 0 0 0 0  Risk for fall due to : No Fall Risks No Fall Risks No Fall Risks No Fall Risks No Fall Risks  Follow up Falls prevention discussed Falls prevention discussed Falls prevention discussed Falls prevention discussed Falls prevention discussed      Functional Status Survey: Is the patient deaf or have difficulty hearing?: No Does the patient have difficulty seeing, even when wearing glasses/contacts?: No Does the patient have difficulty concentrating, remembering, or making decisions?: No Does the patient have difficulty walking or climbing stairs?: No Does the patient have difficulty dressing or bathing?: No Does the patient have difficulty  doing errands alone such as visiting a doctor's office or shopping?: No    Assessment & Plan  1.  Benign essential HTN  - CBC with Differential/Platelet - COMPLETE METABOLIC PANEL WITH GFR - valsartan-hydrochlorothiazide (DIOVAN-HCT) 320-12.5 MG tablet; Take 1 tablet by mouth daily. New dose  Dispense: 90 tablet; Refill: 1  2. Morbid obesity  Discussed with the patient the risk posed by an increased BMI. Discussed importance of portion control, calorie counting and at least 150 minutes of physical activity weekly. Avoid sweet beverages and drink more water. Eat at least 6 servings of fruit and vegetables daily    3. Dysmetabolic syndrome  - Hemoglobin A1c - metFORMIN (GLUCOPHAGE XR) 500 MG 24 hr tablet; Take 1 tablet (500 mg total) by mouth daily with breakfast.  Dispense: 90 tablet; Refill: 1  4. Peripheral neuropathy due to chemotherapy  - gabapentin (NEURONTIN) 300 MG capsule; Take 1 capsule (300 mg total) by mouth 3 (three) times daily.  Dispense: 270 capsule; Refill: 1  5. Dyslipidemia  - Lipid panel - rosuvastatin (CRESTOR) 10 MG tablet; Take 1 tablet (10 mg total) by mouth daily.  Dispense: 90 tablet; Refill: 1  6. Vitamin D deficiency  - Vitamin D, Ergocalciferol, (DRISDOL) 1.25 MG (50000 UNIT) CAPS capsule; Take 1 capsule (50,000 Units total) by mouth every 7 (seven) days.  Dispense: 12 capsule; Refill: 1  7. Gastroesophageal reflux disease without esophagitis  Doing better   8. Insomnia, persistent  - zolpidem (AMBIEN) 10 MG tablet; Take 1 tablet (10 mg total) by mouth at bedtime.  Dispense: 90 tablet; Refill: 1  9. Low serum vitamin B12  Continue supplementation  10. Seasonal allergic rhinitis, unspecified trigger  Take loraatadine prn   11. Breast cancer screening by mammogram  - MM 3D SCREENING MAMMOGRAM BILATERAL BREAST; Future  12. COPD, mild   13. Myalgia  - cyclobenzaprine (FLEXERIL) 10 MG tablet; Take 1 tablet (10 mg total) by mouth at bedtime.  Dispense: 90 tablet; Refill: 1  14. Radiculitis, cervical  - meloxicam (MOBIC) 15 MG tablet;  Take 1 tablet (15 mg total) by mouth daily.  Dispense: 90 tablet; Refill: 0  15. Impingement syndrome of right shoulder  - meloxicam (MOBIC) 15 MG tablet; Take 1 tablet (15 mg total) by mouth daily.  Dispense: 90 tablet; Refill: 0  16. Osteopenia due to cancer therapy  - DG Bone Density; Future

## 2022-11-09 ENCOUNTER — Ambulatory Visit: Payer: BC Managed Care – PPO | Admitting: Family Medicine

## 2022-11-09 ENCOUNTER — Encounter: Payer: Self-pay | Admitting: Family Medicine

## 2022-11-09 VITALS — BP 138/86 | HR 91 | Resp 16 | Ht 64.0 in | Wt 216.0 lb

## 2022-11-09 DIAGNOSIS — E8881 Metabolic syndrome: Secondary | ICD-10-CM | POA: Diagnosis not present

## 2022-11-09 DIAGNOSIS — G620 Drug-induced polyneuropathy: Secondary | ICD-10-CM | POA: Diagnosis not present

## 2022-11-09 DIAGNOSIS — K219 Gastro-esophageal reflux disease without esophagitis: Secondary | ICD-10-CM

## 2022-11-09 DIAGNOSIS — J449 Chronic obstructive pulmonary disease, unspecified: Secondary | ICD-10-CM

## 2022-11-09 DIAGNOSIS — M8580 Other specified disorders of bone density and structure, unspecified site: Secondary | ICD-10-CM

## 2022-11-09 DIAGNOSIS — I10 Essential (primary) hypertension: Secondary | ICD-10-CM | POA: Diagnosis not present

## 2022-11-09 DIAGNOSIS — E538 Deficiency of other specified B group vitamins: Secondary | ICD-10-CM

## 2022-11-09 DIAGNOSIS — E785 Hyperlipidemia, unspecified: Secondary | ICD-10-CM

## 2022-11-09 DIAGNOSIS — E559 Vitamin D deficiency, unspecified: Secondary | ICD-10-CM

## 2022-11-09 DIAGNOSIS — G4700 Insomnia, unspecified: Secondary | ICD-10-CM

## 2022-11-09 DIAGNOSIS — M7541 Impingement syndrome of right shoulder: Secondary | ICD-10-CM

## 2022-11-09 DIAGNOSIS — M5412 Radiculopathy, cervical region: Secondary | ICD-10-CM

## 2022-11-09 DIAGNOSIS — M791 Myalgia, unspecified site: Secondary | ICD-10-CM

## 2022-11-09 DIAGNOSIS — T451X5A Adverse effect of antineoplastic and immunosuppressive drugs, initial encounter: Secondary | ICD-10-CM

## 2022-11-09 DIAGNOSIS — J302 Other seasonal allergic rhinitis: Secondary | ICD-10-CM

## 2022-11-09 DIAGNOSIS — Z1231 Encounter for screening mammogram for malignant neoplasm of breast: Secondary | ICD-10-CM

## 2022-11-09 MED ORDER — MELOXICAM 15 MG PO TABS: 15.0000 mg | ORAL_TABLET | Freq: Every day | ORAL | 0 refills | Status: DC

## 2022-11-09 MED ORDER — ZOLPIDEM TARTRATE 10 MG PO TABS: 10.0000 mg | ORAL_TABLET | Freq: Every day | ORAL | 1 refills | Status: DC

## 2022-11-09 MED ORDER — VALSARTAN-HYDROCHLOROTHIAZIDE 320-12.5 MG PO TABS
1.0000 | ORAL_TABLET | Freq: Every day | ORAL | 1 refills | Status: DC
Start: 2022-11-09 — End: 2023-06-12

## 2022-11-09 MED ORDER — VITAMIN D (ERGOCALCIFEROL) 1.25 MG (50000 UNIT) PO CAPS
50000.0000 [IU] | ORAL_CAPSULE | ORAL | 1 refills | Status: DC
Start: 2022-11-09 — End: 2023-06-12

## 2022-11-09 MED ORDER — CYCLOBENZAPRINE HCL 10 MG PO TABS: 10.0000 mg | ORAL_TABLET | Freq: Every day | ORAL | 1 refills | Status: DC

## 2022-11-09 MED ORDER — ROSUVASTATIN CALCIUM 10 MG PO TABS: 10.0000 mg | ORAL_TABLET | Freq: Every day | ORAL | 1 refills | Status: DC

## 2022-11-09 MED ORDER — VALSARTAN-HYDROCHLOROTHIAZIDE 160-12.5 MG PO TABS
1.0000 | ORAL_TABLET | Freq: Every day | ORAL | 1 refills | Status: DC
Start: 2022-11-09 — End: 2022-11-09

## 2022-11-09 MED ORDER — METFORMIN HCL ER 500 MG PO TB24: 500.0000 mg | ORAL_TABLET | Freq: Every day | ORAL | 1 refills | Status: DC

## 2022-11-09 MED ORDER — GABAPENTIN 300 MG PO CAPS: 300.0000 mg | ORAL_CAPSULE | Freq: Three times a day (TID) | ORAL | 1 refills | Status: DC

## 2022-11-10 LAB — COMPLETE METABOLIC PANEL WITH GFR
AG Ratio: 1.5 (calc) (ref 1.0–2.5)
ALT: 18 U/L (ref 6–29)
AST: 17 U/L (ref 10–35)
Albumin: 4.3 g/dL (ref 3.6–5.1)
Alkaline phosphatase (APISO): 82 U/L (ref 37–153)
BUN: 14 mg/dL (ref 7–25)
CO2: 27 mmol/L (ref 20–32)
Calcium: 10.1 mg/dL (ref 8.6–10.4)
Chloride: 105 mmol/L (ref 98–110)
Creat: 0.82 mg/dL (ref 0.50–1.03)
Globulin: 2.8 g/dL (calc) (ref 1.9–3.7)
Glucose, Bld: 90 mg/dL (ref 65–99)
Potassium: 4.4 mmol/L (ref 3.5–5.3)
Sodium: 140 mmol/L (ref 135–146)
Total Bilirubin: 0.3 mg/dL (ref 0.2–1.2)
Total Protein: 7.1 g/dL (ref 6.1–8.1)
eGFR: 83 mL/min/{1.73_m2} (ref >=60)

## 2022-11-10 LAB — CBC WITH DIFFERENTIAL/PLATELET
Absolute Monocytes: 592 cells/uL (ref 200–950)
Basophils Absolute: 85 cells/uL (ref 0–200)
Basophils Relative: 0.9 %
Eosinophils Absolute: 169 cells/uL (ref 15–500)
Eosinophils Relative: 1.8 %
HCT: 41.2 % (ref 35.0–45.0)
Hemoglobin: 13.4 g/dL (ref 11.7–15.5)
Lymphs Abs: 3309 cells/uL (ref 850–3900)
MCH: 26.8 pg — ABNORMAL LOW (ref 27.0–33.0)
MCHC: 32.5 g/dL (ref 32.0–36.0)
MCV: 82.4 fL (ref 80.0–100.0)
MPV: 10.5 fL (ref 7.5–12.5)
Monocytes Relative: 6.3 %
Neutro Abs: 5245 cells/uL (ref 1500–7800)
Neutrophils Relative %: 55.8 %
Platelets: 331 10*3/uL (ref 140–400)
RBC: 5 10*6/uL (ref 3.80–5.10)
RDW: 14.3 % (ref 11.0–15.0)
Total Lymphocyte: 35.2 %
WBC: 9.4 10*3/uL (ref 3.8–10.8)

## 2022-11-10 LAB — LIPID PANEL
Cholesterol: 106 mg/dL (ref <200)
HDL: 47 mg/dL — ABNORMAL LOW (ref >=50)
LDL Cholesterol (Calc): 45 mg/dL (calc)
Non-HDL Cholesterol (Calc): 59 mg/dL (calc) (ref <130)
Total CHOL/HDL Ratio: 2.3 (calc) (ref <5.0)
Triglycerides: 49 mg/dL (ref <150)

## 2022-11-10 LAB — HEMOGLOBIN A1C
Hgb A1c MFr Bld: 6.7 % of total Hgb — ABNORMAL HIGH (ref <5.7)
Mean Plasma Glucose: 146 mg/dL
eAG (mmol/L): 8.1 mmol/L

## 2022-12-03 NOTE — Patient Instructions (Signed)
Preventive Care 40-58 Years Old, Female Preventive care refers to lifestyle choices and visits with your health care provider that can promote health and wellness. Preventive care visits are also called wellness exams. What can I expect for my preventive care visit? Counseling Your health care provider may ask you questions about your: Medical history, including: Past medical problems. Family medical history. Pregnancy history. Current health, including: Menstrual cycle. Method of birth control. Emotional well-being. Home life and relationship well-being. Sexual activity and sexual health. Lifestyle, including: Alcohol, nicotine or tobacco, and drug use. Access to firearms. Diet, exercise, and sleep habits. Work and work environment. Sunscreen use. Safety issues such as seatbelt and bike helmet use. Physical exam Your health care provider will check your: Height and weight. These may be used to calculate your BMI (body mass index). BMI is a measurement that tells if you are at a healthy weight. Waist circumference. This measures the distance around your waistline. This measurement also tells if you are at a healthy weight and may help predict your risk of certain diseases, such as type 2 diabetes and high blood pressure. Heart rate and blood pressure. Body temperature. Skin for abnormal spots. What immunizations do I need?  Vaccines are usually given at various ages, according to a schedule. Your health care provider will recommend vaccines for you based on your age, medical history, and lifestyle or other factors, such as travel or where you work. What tests do I need? Screening Your health care provider may recommend screening tests for certain conditions. This may include: Lipid and cholesterol levels. Diabetes screening. This is done by checking your blood sugar (glucose) after you have not eaten for a while (fasting). Pelvic exam and Pap test. Hepatitis B test. Hepatitis C  test. HIV (human immunodeficiency virus) test. STI (sexually transmitted infection) testing, if you are at risk. Lung cancer screening. Colorectal cancer screening. Mammogram. Talk with your health care provider about when you should start having regular mammograms. This may depend on whether you have a family history of breast cancer. BRCA-related cancer screening. This may be done if you have a family history of breast, ovarian, tubal, or peritoneal cancers. Bone density scan. This is done to screen for osteoporosis. Talk with your health care provider about your test results, treatment options, and if necessary, the need for more tests. Follow these instructions at home: Eating and drinking  Eat a diet that includes fresh fruits and vegetables, whole grains, lean protein, and low-fat dairy products. Take vitamin and mineral supplements as recommended by your health care provider. Do not drink alcohol if: Your health care provider tells you not to drink. You are pregnant, may be pregnant, or are planning to become pregnant. If you drink alcohol: Limit how much you have to 0-1 drink a day. Know how much alcohol is in your drink. In the [place] one drink equals one 12 oz bottle of beer (355 mL), one 5 oz glass of wine (148 mL), or one 1 oz glass of hard liquor (44 mL). Lifestyle Brush your teeth every morning and night with fluoride toothpaste. Floss one time each day. Exercise for at least 30 minutes 5 or more days each week. Do not use any products that contain nicotine or tobacco. These products include cigarettes, chewing tobacco, and vaping devices, such as e-cigarettes. If you need help quitting, ask your health care provider. Do not use drugs. If you are sexually active, practice safe sex. Use a condom or other form of protection to   prevent STIs. If you do not wish to become pregnant, use a form of birth control. If you plan to become pregnant, see your health care provider for a  prepregnancy visit. Take aspirin only as told by your health care provider. Make sure that you understand how much to take and what form to take. Work with your health care provider to find out whether it is safe and beneficial for you to take aspirin daily. Find healthy ways to manage stress, such as: Meditation, yoga, or listening to music. Journaling. Talking to a trusted person. Spending time with friends and family. Minimize exposure to UV radiation to reduce your risk of skin cancer. Safety Always wear your seat belt while driving or riding in a vehicle. Do not drive: If you have been drinking alcohol. Do not ride with someone who has been drinking. When you are tired or distracted. While texting. If you have been using any mind-altering substances or drugs. Wear a helmet and other protective equipment during sports activities. If you have firearms in your house, make sure you follow all gun safety procedures. Seek help if you have been physically or sexually abused. What's next? Visit your health care provider once a year for an annual wellness visit. Ask your health care provider how often you should have your eyes and teeth checked. Stay up to date on all vaccines. This information is not intended to replace advice given to you by your health care provider. Make sure you discuss any questions you have with your health care provider. Document Revised: 01/04/2021 Document Reviewed: 01/04/2021 Elsevier Patient Education  2023 Elsevier Inc.  

## 2022-12-03 NOTE — Progress Notes (Unsigned)
Name: Brenda Jones   MRN: [id number]    DOB: 1965-08-19   Date:12/04/2022       Progress Note  Subjective  Chief Complaint  Annual Exam  HPI  Patient presents for annual CPE.  Diet: A1C is now in DM range, reminded her of a diabetic diet  Exercise:  discussed importance of regular physical activity, specially after meals  Last Eye Exam: she is due for an eye exam Last Dental Exam: up to date   Flowsheet Row Office Visit from 12/04/2022 in [facility]  AUDIT-C Score 0      Depression: Phq 9 is  negative    12/04/2022    7:54 AM 11/09/2022    7:39 AM 05/10/2022    7:39 AM 11/29/2021    8:30 AM 11/08/2021    7:34 AM  Depression screen PHQ 2/9  Decreased Interest 0 0 0 0 0  Down, Depressed, Hopeless 0 0 0 0 0  PHQ - 2 Score 0 0 0 0 0  Altered sleeping 0 0 0 0 0  Tired, decreased energy 0 0 0 0 0  Change in appetite 0 0 0 0 0  Feeling bad or failure about yourself  0 0 0 0 0  Trouble concentrating 0 0 0 0 0  Moving slowly or fidgety/restless 0 0 0 0 0  Suicidal thoughts 0 0 0 0 0  PHQ-9 Score 0 0 0 0 0   Hypertension: BP Readings from Last 3 Encounters:  12/04/22 132/76  11/09/22 138/86  05/10/22 130/74   Obesity: Wt Readings from Last 3 Encounters:  12/04/22 218 lb (98.9 kg)  11/09/22 216 lb (98 kg)  05/10/22 216 lb (98 kg)   BMI Readings from Last 3 Encounters:  12/04/22 37.42 kg/m  11/09/22 37.08 kg/m  05/10/22 37.08 kg/m     Vaccines:    Tdap: up to date Shingrix: up to date Pneumonia: up to date  Flu: up to date COVID-19: N/A   Hep C Screening: 12/09/19 STD testing and prevention (HIV/chl/gon/syphilis): 03/19/16 Intimate partner violence: negative screen  Sexual History : not sexually active in the past 9 years  Menstrual History/LMP/Abnormal Bleeding: pos-menopausal since age 36  Discussed importance of follow up if any post-menopausal bleeding: yes  Incontinence Symptoms: negative for symptoms   Breast cancer:   - Last Mammogram: Ordered 11/09/22 - BRCA gene screening:   Osteoporosis Prevention : Discussed high calcium and vitamin D supplementation, weight bearing exercises Bone density: Ordered 11/09/22  Cervical cancer screening: 06/30/18  Skin cancer: Discussed monitoring for atypical lesions  Colorectal cancer: 07/19/20   Lung cancer:  Low Dose CT Chest recommended if Age 78-80 years, 20 pack-year currently smoking OR have quit w/in 15years. She wants to hold off on getting it done She is trying to cut down on smoking  ECG: 08/30/11  Advanced Care Planning: A voluntary discussion about advance care planning including the explanation and discussion of advance directives.  Discussed health care proxy and Living will, and the patient was able to identify a health care proxy as son .  Patient does not have a living will and power of attorney of health care   Lipids: Lab Results  Component Value Date   CHOL 106 11/09/2022   CHOL 153 11/08/2021   CHOL 138 12/14/2020   Lab Results  Component Value Date   HDL 47 (L) 11/09/2022   HDL 47 (L) 11/08/2021   HDL 45 (L) 12/14/2020   Lab Results  Component Value Date   LDLCALC 45 11/09/2022   LDLCALC 89 11/08/2021   LDLCALC 76 12/14/2020   Lab Results  Component Value Date   TRIG 49 11/09/2022   TRIG 79 11/08/2021   TRIG 86 12/14/2020   Lab Results  Component Value Date   CHOLHDL 2.3 11/09/2022   CHOLHDL 3.3 11/08/2021   CHOLHDL 3.1 12/14/2020   No results found for: "LDLDIRECT"  Glucose: Glucose  Date Value Ref Range Status  01/25/2012 148 (H) 65 - 99 mg/dL Final  13/02/6577 469 (H) 65 - 99 mg/dL Final  62/95/2841 324 (H) 65 - 99 mg/dL Final   Glucose, Bld  Date Value Ref Range Status  11/09/2022 90 65 - 99 mg/dL Final    Comment:    .            Fasting reference interval .   11/08/2021 85 65 - 99 mg/dL Final    Comment:    .            Fasting reference interval .   12/14/2020 119 (H) 65 - 99 mg/dL Final    Comment:     .            Fasting reference interval . For someone without known diabetes, a glucose value between 100 and 125 mg/dL is consistent with prediabetes and should be confirmed with a follow-up test. .     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/2015   GERD (gastroesophageal reflux disease) 04/18/2015   Muscle spasm 04/18/2015   Benign neoplasm of descending colon    Cervical high risk HPV (human papillomavirus) test positive 03/22/2015   Benign essential HTN 01/08/2015   Insomnia, persistent 01/08/2015   COPD, 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 Margins were clear.    BREAST SURGERY     COLONOSCOPY WITH PROPOFOL N/A 04/04/2015   Procedure: COLONOSCOPY WITH PROPOFOL;  Surgeon: Midge Minium, MD;  Location: [facility];  Service: Endoscopy;  Laterality: N/A;   COLONOSCOPY WITH PROPOFOL N/A 07/19/2020   Procedure: COLONOSCOPY WITH PROPOFOL;  Surgeon: Midge Minium, MD;  Location[facility];  Service: Endoscopy;  Laterality: N/A;   POLYPECTOMY  04/04/2015   Procedure: POLYPECTOMY;  Surgeon: Midge Minium, MD;  Location: [facility];  Service: Endoscopy;;    Family History  Problem Relation Age of Onset   Heart disease Mother     Diabetes Father    Hypertension Father    Breast cancer Neg Hx     Social History   Socioeconomic History   Marital status: Single    Spouse name: Not on file   Number of children: 1   Years of education: Not on file   Highest education  level: 12th grade  Occupational History   Occupation: Media planner.   Tobacco Use   Smoking status: Every Day    Packs/day: 0.75    Years: 36.00    Additional pack years: 0.00    Total pack years: 27.00    Types: Cigarettes    Start date: 06/27/1981   Smokeless tobacco: Never  Vaping Use   Vaping Use: Never used  Substance and Sexual Activity   Alcohol use: No    Alcohol/week: 0.0 standard drinks of alcohol   Drug use: No   Sexual activity: Not Currently    Partners: Male  Other Topics Concern   Not on file  Social History Narrative   ** Merged History Encounter **       She has one son, still lives at home, he helps her out financially also.    Social Determinants of Health   Financial Resource Strain: Low Risk  (12/04/2022)   Overall Financial Resource Strain (CARDIA)    Difficulty of Paying Living Expenses: Not hard at all  Food Insecurity: No Food Insecurity (12/04/2022)   Hunger Vital Sign    Worried About Running Out of Food in the Last Year: Never true    Ran Out of Food in the Last Year: Never true  Transportation Needs: No Transportation Needs (12/04/2022)   PRAPARE - Administrator, Civil Service (Medical): No    Lack of Transportation (Non-Medical): No  Physical Activity: Inactive (12/04/2022)   Exercise Vital Sign    Days of Exercise per Week: 0 days    Minutes of Exercise per Session: 0 min  Stress: No Stress Concern Present (12/04/2022)   [employer] of Occupational Health - Occupational Stress Questionnaire    Feeling of Stress : Not at all  Social Connections: Moderately Isolated (12/04/2022)   Social Connection and Isolation Panel [NHANES]    Frequency of Communication with Friends and  Family: More than three times a week    Frequency of Social Gatherings with Friends and Family: More than three times a week    Attends Religious Services: More than 4 times per year    Active Member of [employer] or Organizations: No    Attends Banker Meetings: Never    Marital Status: Never married  Intimate Partner Violence: Not At Risk (12/04/2022)   Humiliation, Afraid, Rape, and Kick questionnaire    Fear of Current or Ex-Partner: No    Emotionally Abused: No    Physically Abused: No    Sexually Abused: No     Current Outpatient Medications:    Cyanocobalamin (VITAMIN B-12) 1000 MCG SUBL, Place 1 tablet (1,000 mcg total) under the tongue 3 (three) times a week., Disp: 100 tablet, Rfl: 0   cyclobenzaprine (FLEXERIL) 10 MG tablet, Take 1 tablet (10 mg total) by mouth at bedtime., Disp: 90 tablet, Rfl: 1   fluticasone (FLONASE) 50 MCG/ACT nasal spray, PLACE 2 SPRAYS INTO BOTH NOSTRILS AS NEEDED., Disp: 48 mL, Rfl: 1   Fluticasone-Umeclidin-Vilant (TRELEGY ELLIPTA) 100-62.5-25 MCG/ACT AEPB, Inhale 1 puff into the lungs daily., Disp: 60 each, Rfl: 5   gabapentin (NEURONTIN) 300 MG capsule, Take 1 capsule (300 mg total) by mouth 3 (three) times daily., Disp: 270 capsule, Rfl: 1   loratadine (CLARITIN) 10 MG tablet, Take 1 tablet (10 mg total) by mouth daily as needed (cough, allergies)., Disp: 30 tablet, Rfl: 2   meloxicam (MOBIC) 15 MG tablet, Take 1 tablet (15 mg total) by mouth daily., Disp: 90  tablet, Rfl: 0   metFORMIN (GLUCOPHAGE XR) 500 MG 24 hr tablet, Take 1 tablet (500 mg total) by mouth daily with breakfast., Disp: 90 tablet, Rfl: 1   omeprazole (PRILOSEC) 40 MG capsule, Take 1 capsule (40 mg total) by mouth daily., Disp: 90 capsule, Rfl: 1   rosuvastatin (CRESTOR) 10 MG tablet, Take 1 tablet (10 mg total) by mouth daily., Disp: 90 tablet, Rfl: 1   valsartan-hydrochlorothiazide (DIOVAN-HCT) 320-12.5 MG tablet, Take 1 tablet by mouth daily. New dose, Disp: 90 tablet, Rfl:  1   Vitamin D, Ergocalciferol, (DRISDOL) 1.25 MG (50000 UNIT) CAPS capsule, Take 1 capsule (50,000 Units total) by mouth every 7 (seven) days., Disp: 12 capsule, Rfl: 1   zolpidem (AMBIEN) 10 MG tablet, Take 1 tablet (10 mg total) by mouth at bedtime., Disp: 90 tablet, Rfl: 1  Allergies  Allergen Reactions   Ace Inhibitors Cough     ROS  Constitutional: Negative for fever or weight change.  Respiratory: Negative for cough and shortness of breath.   Cardiovascular: Negative for chest pain or palpitations.  Gastrointestinal: Negative for abdominal pain, no bowel changes.  Musculoskeletal: Negative for gait problem or joint swelling.  Skin: positive for new round rash on right outer ankle, improving with hydrocortisone  Neurological: Negative for dizziness or headache.  No other specific complaints in a complete review of systems (except as listed in HPI above).   Objective  Vitals:   12/04/22 0754  BP: 132/76  Pulse: 90  Resp: 16  SpO2: 98%  Weight: 218 lb (98.9 kg)  Height: 5\' 4"  (1.626 m)    Body mass index is 37.42 kg/m.  Physical Exam   Constitutional: Patient appears well-developed and well-nourished. No distress.  HENT: Head: Normocephalic and atraumatic. Ears: B TMs ok, no erythema or effusion Eyes: Conjunctivae and EOM are normal. Pupils are equal, round, and reactive to light. No scleral icterus.  Neck: Normal range of motion. Neck supple. No JVD present. No thyromegaly present.  Cardiovascular: Normal rate, regular rhythm and normal heart sounds.  No murmur heard. No BLE edema. Pulmonary/Chest: Effort normal and breath sounds normal. No respiratory distress. Abdominal: Soft. Bowel sounds are normal, no distension. There is no tenderness. no masses Breast: no lumps or masses, no nipple discharge or rashes FEMALE GENITALIA:  External genitalia normal External urethra normal Vaginal vault normal without discharge or lesions Cervix normal without discharge or  lesions Bimanual exam normal without masses RECTAL: not done  Musculoskeletal: Normal range of motion, no joint effusions. No gross deformities Neurological: he is alert and oriented to person, place, and time. No cranial nerve deficit. Coordination, balance, strength, speech and gait are normal.  Skin: Skin is warm and dry. inner thighs rash from chafing  Psychiatric: Patient has a normal mood and affect. behavior is normal. Judgment and thought content normal.   Recent Results (from the past 2160 hour(s))  Lipid panel     Status: Abnormal   Collection Time: 11/09/22  8:32 AM  Result Value Ref Range   Cholesterol 106 <200 mg/dL   HDL 47 (L) > OR = 50 mg/dL   Triglycerides 49 <161 mg/dL   LDL Cholesterol (Calc) 45 mg/dL (calc)    Comment: Reference range: <100 . Desirable range <100 mg/dL for primary prevention;   <70 mg/dL for patients with CHD or diabetic patients  with > or = 2 CHD risk factors. Marland Kitchen LDL-C is now calculated using the Martin-Hopkins  calculation, which is a validated novel method providing  better accuracy than the Friedewald equation in the  estimation of LDL-C.  Horald Pollen et al. Lenox Ahr. 1610;960(45): 2061-2068  (http://education.[facility].com/faq/FAQ164)    Total CHOL/HDL Ratio 2.3 <5.0 (calc)   Non-HDL Cholesterol (Calc) 59 <409 mg/dL (calc)    Comment: For patients with diabetes plus 1 major ASCVD risk  factor, treating to a non-HDL-C goal of <100 mg/dL  (LDL-C of <81 mg/dL) is considered a therapeutic  option.   CBC with Differential/Platelet     Status: Abnormal   Collection Time: 11/09/22  8:32 AM  Result Value Ref Range   WBC 9.4 3.8 - 10.8 Thousand/uL   RBC 5.00 3.80 - 5.10 Million/uL   Hemoglobin 13.4 11.7 - 15.5 g/dL   HCT 19.1 47.8 - 29.5 %   MCV 82.4 80.0 - 100.0 fL   MCH 26.8 (L) 27.0 - 33.0 pg   MCHC 32.5 32.0 - 36.0 g/dL   RDW 62.1 30.8 - 65.7 %   Platelets 331 140 - 400 Thousand/uL   MPV 10.5 7.5 - 12.5 fL   Neutro Abs 5,245 1,500  - 7,800 cells/uL   Lymphs Abs 3,309 850 - 3,900 cells/uL   Absolute Monocytes 592 200 - 950 cells/uL   Eosinophils Absolute 169 15 - 500 cells/uL   Basophils Absolute 85 0 - 200 cells/uL   Neutrophils Relative % 55.8 %   Total Lymphocyte 35.2 %   Monocytes Relative 6.3 %   Eosinophils Relative 1.8 %   Basophils Relative 0.9 %  COMPLETE METABOLIC PANEL WITH GFR     Status: None   Collection Time: 11/09/22  8:32 AM  Result Value Ref Range   Glucose, Bld 90 65 - 99 mg/dL    Comment: .            Fasting reference interval .    BUN 14 7 - 25 mg/dL   Creat 8.46 9.62 - 9.52 mg/dL   eGFR 83 > OR = 60 WU/XLK/4.40N0   BUN/Creatinine Ratio SEE NOTE: 6 - 22 (calc)    Comment:    Not Reported: BUN and Creatinine are within    reference range. .    Sodium 140 135 - 146 mmol/L   Potassium 4.4 3.5 - 5.3 mmol/L   Chloride 105 98 - 110 mmol/L   CO2 27 20 - 32 mmol/L   Calcium 10.1 8.6 - 10.4 mg/dL   Total Protein 7.1 6.1 - 8.1 g/dL   Albumin 4.3 3.6 - 5.1 g/dL   Globulin 2.8 1.9 - 3.7 g/dL (calc)   AG Ratio 1.5 1.0 - 2.5 (calc)   Total Bilirubin 0.3 0.2 - 1.2 mg/dL   Alkaline phosphatase (APISO) 82 37 - 153 U/L   AST 17 10 - 35 U/L   ALT 18 6 - 29 U/L  Hemoglobin A1c     Status: Abnormal   Collection Time: 11/09/22  8:32 AM  Result Value Ref Range   Hgb A1c MFr Bld 6.7 (H) <5.7 % of total Hgb    Comment: For someone without known diabetes, a hemoglobin A1c value of 6.5% or greater indicates that they may have  diabetes and this should be confirmed with a follow-up  test. . For someone with known diabetes, a value <7% indicates  that their diabetes is well controlled and a value  greater than or equal to 7% indicates suboptimal  control. A1c targets should be individualized based on  duration of diabetes, age, comorbid conditions, and  other considerations. . Currently, no consensus exists  regarding use of hemoglobin A1c for diagnosis of diabetes for children. .    Mean  Plasma Glucose 146 mg/dL   eAG (mmol/L) 8.1 mmol/L    Comment: . This test was performed on the Roche cobas c503 platform. Effective 04/30/22, a change in test platforms from the Abbott Architect to the Roche cobas c503 may have shifted HbA1c results compared to historical results. Based on laboratory validation testing conducted at [facility], the Roche platform relative to the Abbott platform had an average increase in HbA1c value of < or = 0.3%. This difference is within accepted  variability established by the [facility]. Note that not all individuals will have had a shift in their results and direct comparisons between historical and current results for testing conducted on different platforms is not recommended.      Fall Risk:    12/04/2022    7:53 AM 11/09/2022    7:39 AM 05/10/2022    7:39 AM 11/29/2021    8:29 AM 11/08/2021    7:34 AM  Fall Risk   Falls in the past year? 1 1 0 0 0  Number falls in past yr: 0 0 0 0 0  Injury with Fall? 1 0 0 0 0  Risk for fall due to : No Fall Risks No Fall Risks No Fall Risks No Fall Risks No Fall Risks  Follow up Falls prevention discussed Falls prevention discussed Falls prevention discussed Falls prevention discussed Falls prevention discussed     Functional Status Survey: Is the patient deaf or have difficulty hearing?: No Does the patient have difficulty seeing, even when wearing glasses/contacts?: No Does the patient have difficulty concentrating, remembering, or making decisions?: No Does the patient have difficulty walking or climbing stairs?: No Does the patient have difficulty dressing or bathing?: No Does the patient have difficulty doing errands alone such as visiting a doctor's office or shopping?: No   Assessment & Plan  1. Well adult exam  She will try to follow a diabetic diet, increase activity and try to quit smoking   2. Cervical cancer screening  - Cytology - PAP      -USPSTF grade A and B recommendations reviewed with patient; age-appropriate recommendations, preventive care, screening tests, etc discussed and encouraged; healthy living encouraged; see AVS for patient education given to patient -Discussed importance of 150 minutes of physical activity weekly, eat two servings of fish weekly, eat one serving of tree nuts ( cashews, pistachios, pecans, almonds.Marland Kitchen) every other day, eat 6 servings of fruit/vegetables daily and drink plenty of water and avoid sweet beverages.   -Reviewed Health Maintenance: Yes.

## 2022-12-04 ENCOUNTER — Other Ambulatory Visit (HOSPITAL_COMMUNITY)
Admission: RE | Admit: 2022-12-04 | Discharge: 2022-12-04 | Disposition: A | Discharge status: Home / Self Care | Payer: BC Managed Care – PPO | Source: Ambulatory Visit | Attending: Family Medicine | Admitting: Family Medicine

## 2022-12-04 ENCOUNTER — Ambulatory Visit (INDEPENDENT_AMBULATORY_CARE_PROVIDER_SITE_OTHER): Payer: BC Managed Care – PPO | Admitting: Family Medicine

## 2022-12-04 ENCOUNTER — Encounter: Payer: Self-pay | Admitting: Family Medicine

## 2022-12-04 VITALS — BP 132/76 | HR 90 | Resp 16 | Ht 64.0 in | Wt 218.0 lb

## 2022-12-04 DIAGNOSIS — Z0000 Encounter for general adult medical examination without abnormal findings: Secondary | ICD-10-CM

## 2022-12-04 DIAGNOSIS — Z124 Encounter for screening for malignant neoplasm of cervix: Secondary | ICD-10-CM | POA: Diagnosis not present

## 2022-12-06 ENCOUNTER — Other Ambulatory Visit: Payer: Self-pay | Admitting: Family Medicine

## 2022-12-06 DIAGNOSIS — I10 Essential (primary) hypertension: Secondary | ICD-10-CM

## 2022-12-07 ENCOUNTER — Other Ambulatory Visit: Payer: Self-pay

## 2022-12-07 DIAGNOSIS — R87618 Other abnormal cytological findings on specimens from cervix uteri: Secondary | ICD-10-CM

## 2022-12-07 DIAGNOSIS — R87619 Unspecified abnormal cytological findings in specimens from cervix uteri: Secondary | ICD-10-CM

## 2022-12-07 LAB — CYTOLOGY - PAP
Comment: NEGATIVE
High risk HPV: NEGATIVE

## 2022-12-07 NOTE — Telephone Encounter (Signed)
Hyzaar 12.5 mg refused because it was discontinued 05/10/2022.

## 2022-12-12 ENCOUNTER — Other Ambulatory Visit: Payer: Self-pay | Admitting: Family Medicine

## 2023-01-25 ENCOUNTER — Other Ambulatory Visit: Payer: Self-pay | Admitting: Family Medicine

## 2023-01-25 DIAGNOSIS — I10 Essential (primary) hypertension: Secondary | ICD-10-CM

## 2023-02-05 ENCOUNTER — Ambulatory Visit (INDEPENDENT_AMBULATORY_CARE_PROVIDER_SITE_OTHER): Payer: BC Managed Care – PPO | Admitting: Obstetrics and Gynecology

## 2023-02-05 ENCOUNTER — Other Ambulatory Visit (HOSPITAL_COMMUNITY)
Admission: RE | Admit: 2023-02-05 | Discharge: 2023-02-05 | Disposition: A | Discharge status: Home / Self Care | Payer: BC Managed Care – PPO | Source: Ambulatory Visit | Attending: Obstetrics and Gynecology | Admitting: Obstetrics and Gynecology

## 2023-02-05 ENCOUNTER — Encounter: Payer: Self-pay | Admitting: Obstetrics and Gynecology

## 2023-02-05 ENCOUNTER — Other Ambulatory Visit: Payer: Self-pay | Admitting: Family Medicine

## 2023-02-05 VITALS — BP 137/81 | HR 87 | Resp 16 | Ht 64.0 in | Wt 217.9 lb

## 2023-02-05 DIAGNOSIS — R87619 Unspecified abnormal cytological findings in specimens from cervix uteri: Secondary | ICD-10-CM | POA: Insufficient documentation

## 2023-02-05 DIAGNOSIS — Z758 Other problems related to medical facilities and other health care: Secondary | ICD-10-CM | POA: Insufficient documentation

## 2023-02-05 DIAGNOSIS — I10 Essential (primary) hypertension: Secondary | ICD-10-CM

## 2023-02-05 DIAGNOSIS — N858 Other specified noninflammatory disorders of uterus: Secondary | ICD-10-CM | POA: Diagnosis not present

## 2023-02-05 DIAGNOSIS — N870 Mild cervical dysplasia: Secondary | ICD-10-CM

## 2023-02-05 DIAGNOSIS — N879 Dysplasia of cervix uteri, unspecified: Secondary | ICD-10-CM | POA: Diagnosis not present

## 2023-02-05 NOTE — Addendum Note (Signed)
Addended by: Tommie Raymond on: 02/05/2023 11:24 AM   Modules accepted: Orders

## 2023-02-05 NOTE — Progress Notes (Signed)
    GYNECOLOGY OFFICE COLPOSCOPY PROCEDURE NOTE  57 y.o. [id number] here for colposcopy for glandular cell abnormality (AGUS) pap smear on 12/04/2022. Was referred from her PCP Dr. Alba Cory at [facility]. Discussed role for HPV in cervical dysplasia, need for surveillance.  Patient gave informed written consent, time out was performed.  Placed in lithotomy position. Cervix viewed with speculum and colposcope after application of acetic acid.   Colposcopy adequate? No. Unable to visualize entire cervix due to labia obstructing view.   acetowhite lesion(s) noted at 6 o'clock and 1 o'clock; corresponding biopsies obtained.  ECC specimen obtained. And endometrial biopsy was also performed due to abnormal glandular cells (see procedure note below).  All specimens were labeled and sent to pathology.  Chaperone was present during entire procedure.  Patient was given post procedure instructions.  Will follow up pathology and manage accordingly; patient will be contacted with results and recommendations.  Routine preventative health maintenance measures emphasized.   Endometrial Biopsy Procedure Note  After colposcopy with biopsies were performed,  the pipette was placed into the endocervical canal and is advanced to the uterine fundus. Using a piston like technique, with vacuum created by withdrawing the stylus, the endometrial specimen is obtained and transferred to the biopsy container. Minimal bleeding is encountered. Monsel's solution was placed over the cervical biopsies. The procedure is well tolerated.   Uterine Position: mid    Uterine Length:  7 cm   Uterine Specimen: Scant   Post procedure instructions are given. The patient is scheduled for follow up appointment.      Hildred Laser, MD [facility]

## 2023-02-06 LAB — SURGICAL PATHOLOGY

## 2023-02-11 NOTE — Progress Notes (Unsigned)
Name: Brenda Jones   MRN: [id number]    DOB: [birth year]   Date:02/12/2023       Progress Note  Subjective  Chief Complaint  Follow Up  HPI  HTN: taking bp medication daily, valsartan  hydrochlorothiazide last visit we switched dose to 320/12.5 mg but pharmacy dispensed old dose of 160/12.5 mg . She continues to have  cramping since chemo back in 2013 but gradually improving . No chest pain, palpitation or dizziness,  she states ankle swelling has improved   GERD: seen by Dr. Lars Pinks, history of h. Pylori, still taking Omeprazole prn only now.  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 Discussed yearly eye exam, urine micro, foot exam, she wants to continue current regiment  since A1C improved today.   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 Used to see Dr. Orlie Dakin, currently taking vitamin D rx, she had repeat bone density im 02/2020 and it showed mild progression at spine level, femur has been stable Continue vitamin D and calcium supplementation  She agrees on having a repeat study this year when she goes for another mammogram Sep 2024     Insomnia: working 3 am to 3 pm taking Ambien around as soon as she gets home. She is able to sleep 6 hours with medication. Denies side effects . Stable    COPD: She quit smoking for about one month but resumed after father died 09-30-17. She started smoking at age 53 , currently smoking only a few cigarettes a day and trying to quit again  . She states no longer coughing daily, denies sob or wheezing. Taking Trelegy prn now    Muscles Spasms: got much worse after chemotherapy  - in 2013 and takes Cyclobenzaprine to control symptoms, usually in the evening when she gets home from work . It can be on her  trunk or legs, she has tried magnesium and co Q 10 without improvement of symptoms. Unchanged    History of breast cancer left:s/p lumpectomy she completed 5 years of Anastrozole 03/2017. Last  Mammogram was done in 2023, she saw Dr. Orlie Dakin in 2023 , going back in September   Peripheral Neuropathy due to chemotherapy : still has daily pain on both legs and feet also also tips of fingers.  Lyrica did not work for her, Gabapentin no longer making her feel very drowsy but still has pain daily 6/10 and stable . She is still only taking it BID, explained again she can take it TID    Morbid Obesity : BMI 35 with co-morbidities- OA, HTN , GERD, Metabolic syndrome. She is down to two sodas a day   B12 Deficiency and vitamin D deficiency: continue supplements , continue supplementation    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/2015   GERD (gastroesophageal reflux disease) 04/18/2015   Muscle spasm 04/18/2015  Benign neoplasm of descending colon    Cervical high risk HPV (human papillomavirus) test positive 03/22/2015   Benign essential HTN 01/08/2015   Insomnia, persistent 01/08/2015   COPD, 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 Margins were clear.    BREAST SURGERY      COLONOSCOPY WITH PROPOFOL N/A 04/04/2015   Procedure: COLONOSCOPY WITH PROPOFOL;  Surgeon: Midge Minium, MD;  Location: [facility];  Service: Endoscopy;  Laterality: N/A;   COLONOSCOPY WITH PROPOFOL N/A 07/19/2020   Procedure: COLONOSCOPY WITH PROPOFOL;  Surgeon: Midge Minium, MD;  Location: [facility];  Service: Endoscopy;  Laterality: N/A;   POLYPECTOMY  04/04/2015   Procedure: POLYPECTOMY;  Surgeon: Midge Minium, MD;  Location: [facility];  Service: Endoscopy;;    Family History  Problem Relation Age of Onset   Heart disease Mother    Diabetes Father    Hypertension Father    Breast cancer Neg Hx     Social History   Tobacco Use   Smoking status: Every Day    Current packs/day: 0.75    Average packs/day: 0.8 packs/day for 41.6 years (31.2 ttl pk-yrs)    Types: Cigarettes    Start date: 06/27/1981   Smokeless tobacco: Never  Substance Use Topics   Alcohol use: No    Alcohol/week: 0.0 standard drinks of alcohol     Current Outpatient Medications:    Cyanocobalamin (VITAMIN B-12) 1000 MCG SUBL, Place 1 tablet (1,000 mcg total) under the tongue 3 (three) times a week., Disp: 100 tablet, Rfl: 0   cyclobenzaprine (FLEXERIL) 10 MG tablet, Take 1 tablet (10 mg total) by mouth at bedtime., Disp: 90 tablet, Rfl: 1   fluticasone (FLONASE) 50 MCG/ACT nasal spray, PLACE 2 SPRAYS INTO BOTH NOSTRILS AS NEEDED., Disp: 48 mL, Rfl: 1   Fluticasone-Umeclidin-Vilant (TRELEGY ELLIPTA) 100-62.5-25 MCG/ACT AEPB, Inhale 1 puff into the lungs daily., Disp: 60 each, Rfl: 5   gabapentin (NEURONTIN) 300 MG capsule, Take 1 capsule (300 mg total) by mouth 3 (three) times daily., Disp: 270 capsule, Rfl: 1   loratadine (CLARITIN) 10 MG tablet, Take 1 tablet (10 mg total) by mouth daily as needed (cough, allergies)., Disp: 30 tablet, Rfl: 2   meloxicam (MOBIC) 15 MG tablet, Take 1 tablet (15 mg total) by mouth daily., Disp: 90 tablet, Rfl: 0   metFORMIN (GLUCOPHAGE XR) 500 MG 24 hr  tablet, Take 1 tablet (500 mg total) by mouth daily with breakfast., Disp: 90 tablet, Rfl: 1   omeprazole (PRILOSEC) 40 MG capsule, Take 1 capsule (40 mg total) by mouth daily., Disp: 90 capsule, Rfl: 1   rosuvastatin (CRESTOR) 10 MG tablet, Take 1 tablet (10 mg total) by mouth daily., Disp: 90 tablet, Rfl: 1   valsartan-hydrochlorothiazide (DIOVAN-HCT) 320-12.5 MG tablet, Take 1 tablet by mouth daily. New dose, Disp: 90 tablet, Rfl: 1   Vitamin D, Ergocalciferol, (DRISDOL) 1.25 MG (50000 UNIT) CAPS capsule, Take 1 capsule (50,000 Units total) by mouth every 7 (seven) days., Disp: 12 capsule, Rfl: 1   zolpidem (AMBIEN) 10 MG tablet, Take  1 tablet (10 mg total) by mouth at bedtime., Disp: 90 tablet, Rfl: 1  Allergies  Allergen Reactions   Ace Inhibitors Cough    I personally reviewed active problem list, medication list, allergies, family history, social history, health maintenance with the patient/caregiver today.   ROS  Ten systems reviewed and is negative except as mentioned in HPI    Objective  Vitals:   02/12/23 0807  BP: 132/80  Pulse: 97  Resp: 16  Temp: 97.9 F (36.6 C)  TempSrc: Oral  SpO2: 100%  Weight: 214 lb 1.6 oz (97.1 kg)  Height: 5\' 4"  (1.626 m)    Body mass index is 36.75 kg/m.  Physical Exam  Constitutional: Patient appears well-developed and well-nourished. Obese  No distress.  HEENT: head atraumatic, normocephalic, pupils equal and reactive to light,  neck supple Cardiovascular: Normal rate, regular rhythm and normal heart sounds.  No murmur heard. No BLE edema. Pulmonary/Chest: Effort normal and breath sounds normal. No respiratory distress. Abdominal: Soft.  There is no tenderness. Psychiatric: Patient has a normal mood and affect. behavior is normal. Judgment and thought content normal.    PHQ2/9:    02/12/2023    8:09 AM 12/04/2022    7:54 AM 11/09/2022    7:39 AM 05/10/2022    7:39 AM 11/29/2021    8:30 AM  Depression screen PHQ 2/9   Decreased Interest 0 0 0 0 0  Down, Depressed, Hopeless 0 0 0 0 0  PHQ - 2 Score 0 0 0 0 0  Altered sleeping 0 0 0 0 0  Tired, decreased energy 0 0 0 0 0  Change in appetite 0 0 0 0 0  Feeling bad or failure about yourself  0 0 0 0 0  Trouble concentrating 0 0 0 0 0  Moving slowly or fidgety/restless 0 0 0 0 0  Suicidal thoughts 0 0 0 0 0  PHQ-9 Score 0 0 0 0 0    phq 9 is negative   Fall Risk:    02/12/2023    8:09 AM 12/04/2022    7:53 AM 11/09/2022    7:39 AM 05/10/2022    7:39 AM 11/29/2021    8:29 AM  Fall Risk   Falls in the past year? 1 1 1  0 0  Number falls in past yr: 0 0 0 0 0  Injury with Fall? 1 1 0 0 0  Risk for fall due to : History of fall(s) No Fall Risks No Fall Risks No Fall Risks No Fall Risks  Follow up Falls prevention discussed;Education provided;Falls evaluation completed Falls prevention discussed Falls prevention discussed Falls prevention discussed Falls prevention discussed      Functional Status Survey: Is the patient deaf or have difficulty hearing?: No Does the patient have difficulty seeing, even when wearing glasses/contacts?: No Does the patient have difficulty concentrating, remembering, or making decisions?: No Does the patient have difficulty walking or climbing stairs?: No Does the patient have difficulty dressing or bathing?: No Does the patient have difficulty doing errands alone such as visiting a doctor's office or shopping?: No    Assessment & Plan  1. Hypertension associated with type 2 diabetes mellitus (HCC)  - POCT HgB A1C  Pharmacy gave old rx of vaslartan 160/12.5 mg, we are calling them to stop that dose and dispense the 320/12.5 mg dose   2. Peripheral neuropathy due to chemotherapy (HCC)  On gabapentin   3. Morbid obesity (HCC)  Discussed with the patient the  risk posed by an increased BMI. Discussed importance of portion control, calorie counting and at least 150 minutes of physical activity weekly. Avoid sweet  beverages and drink more water. Eat at least 6 servings of fruit and vegetables daily    4. COPD, mild (HCC)  Using Trelegy prn only   5. Gastroesophageal reflux disease without esophagitis  Taking prilosec otc almost daily   6. Insomnia, persistent  Stable   7. Osteopenia due to cancer therapy  Discussed high calcium diet and vitamin D   8. Low serum vitamin B12  - Cyanocobalamin (VITAMIN B-12) 1000 MCG SUBL; Place 1 tablet (1,000 mcg total) under the tongue 3 (three) times a week.  Dispense: 100 tablet; Refill: 1  9. Benign essential HTN

## 2023-02-12 ENCOUNTER — Encounter: Payer: Self-pay | Admitting: Family Medicine

## 2023-02-12 ENCOUNTER — Ambulatory Visit (INDEPENDENT_AMBULATORY_CARE_PROVIDER_SITE_OTHER): Payer: BC Managed Care – PPO | Admitting: Family Medicine

## 2023-02-12 VITALS — BP 132/80 | HR 97 | Temp 97.9°F | Resp 16 | Ht 64.0 in | Wt 214.1 lb

## 2023-02-12 DIAGNOSIS — E1159 Type 2 diabetes mellitus with other circulatory complications: Secondary | ICD-10-CM | POA: Diagnosis not present

## 2023-02-12 DIAGNOSIS — J449 Chronic obstructive pulmonary disease, unspecified: Secondary | ICD-10-CM | POA: Diagnosis not present

## 2023-02-12 DIAGNOSIS — I152 Hypertension secondary to endocrine disorders: Secondary | ICD-10-CM | POA: Diagnosis not present

## 2023-02-12 DIAGNOSIS — G620 Drug-induced polyneuropathy: Secondary | ICD-10-CM | POA: Diagnosis not present

## 2023-02-12 DIAGNOSIS — K219 Gastro-esophageal reflux disease without esophagitis: Secondary | ICD-10-CM

## 2023-02-12 DIAGNOSIS — M8580 Other specified disorders of bone density and structure, unspecified site: Secondary | ICD-10-CM

## 2023-02-12 DIAGNOSIS — G4700 Insomnia, unspecified: Secondary | ICD-10-CM

## 2023-02-12 DIAGNOSIS — E538 Deficiency of other specified B group vitamins: Secondary | ICD-10-CM

## 2023-02-12 DIAGNOSIS — T451X5A Adverse effect of antineoplastic and immunosuppressive drugs, initial encounter: Secondary | ICD-10-CM

## 2023-02-12 DIAGNOSIS — I10 Essential (primary) hypertension: Secondary | ICD-10-CM

## 2023-02-12 LAB — POCT GLYCOSYLATED HEMOGLOBIN (HGB A1C): Hemoglobin A1C: 6 % — AB (ref 4.0–5.6)

## 2023-02-12 MED ORDER — VITAMIN B-12 1000 MCG SL SUBL
1.0000 | SUBLINGUAL_TABLET | SUBLINGUAL | 1 refills | Status: AC
Start: 2023-02-13 — End: ?

## 2023-04-04 ENCOUNTER — Ambulatory Visit
Admission: RE | Admit: 2023-04-04 | Discharge: 2023-04-04 | Disposition: A | Discharge status: Home / Self Care | Payer: BC Managed Care – PPO | Source: Ambulatory Visit | Attending: Family Medicine | Admitting: Family Medicine

## 2023-04-04 DIAGNOSIS — M8580 Other specified disorders of bone density and structure, unspecified site: Secondary | ICD-10-CM | POA: Insufficient documentation

## 2023-04-04 DIAGNOSIS — M8588 Other specified disorders of bone density and structure, other site: Secondary | ICD-10-CM | POA: Diagnosis not present

## 2023-04-04 DIAGNOSIS — Z1231 Encounter for screening mammogram for malignant neoplasm of breast: Secondary | ICD-10-CM | POA: Insufficient documentation

## 2023-04-04 DIAGNOSIS — Z780 Asymptomatic menopausal state: Secondary | ICD-10-CM | POA: Diagnosis not present

## 2023-04-05 ENCOUNTER — Other Ambulatory Visit: Payer: Self-pay | Admitting: Family Medicine

## 2023-04-05 DIAGNOSIS — R928 Other abnormal and inconclusive findings on diagnostic imaging of breast: Secondary | ICD-10-CM

## 2023-04-05 DIAGNOSIS — N6489 Other specified disorders of breast: Secondary | ICD-10-CM

## 2023-04-16 ENCOUNTER — Ambulatory Visit
Admission: RE | Admit: 2023-04-16 | Discharge: 2023-04-16 | Disposition: A | Discharge status: Home / Self Care | Payer: BC Managed Care – PPO | Source: Ambulatory Visit | Attending: Family Medicine | Admitting: Family Medicine

## 2023-04-16 ENCOUNTER — Other Ambulatory Visit: Payer: Self-pay | Admitting: Family Medicine

## 2023-04-16 DIAGNOSIS — N6489 Other specified disorders of breast: Secondary | ICD-10-CM | POA: Diagnosis not present

## 2023-04-16 DIAGNOSIS — R928 Other abnormal and inconclusive findings on diagnostic imaging of breast: Secondary | ICD-10-CM

## 2023-04-16 DIAGNOSIS — R92322 Mammographic fibroglandular density, left breast: Secondary | ICD-10-CM | POA: Diagnosis not present

## 2023-04-16 DIAGNOSIS — C50912 Malignant neoplasm of unspecified site of left female breast: Secondary | ICD-10-CM | POA: Diagnosis not present

## 2023-04-30 ENCOUNTER — Ambulatory Visit
Admission: RE | Admit: 2023-04-30 | Discharge: 2023-04-30 | Disposition: A | Discharge status: Home / Self Care | Payer: BC Managed Care – PPO | Source: Ambulatory Visit | Attending: Internal Medicine | Admitting: Internal Medicine

## 2023-04-30 DIAGNOSIS — R928 Other abnormal and inconclusive findings on diagnostic imaging of breast: Secondary | ICD-10-CM

## 2023-04-30 DIAGNOSIS — N6489 Other specified disorders of breast: Secondary | ICD-10-CM | POA: Diagnosis not present

## 2023-04-30 HISTORY — PX: BREAST BIOPSY: SHX20

## 2023-04-30 MED ORDER — LIDOCAINE 1 % OPTIME INJ - NO CHARGE
5.0000 mL | Freq: Once | INTRAMUSCULAR | Status: AC
  Administered 2023-04-30: 5 mL
  Filled 2023-04-30: qty 6

## 2023-04-30 MED ORDER — LIDOCAINE-EPINEPHRINE 1 %-1:100000 IJ SOLN
10.0000 mL | Freq: Once | INTRAMUSCULAR | Status: AC
  Administered 2023-04-30: 10 mL
  Filled 2023-04-30: qty 10

## 2023-05-01 LAB — SURGICAL PATHOLOGY

## 2023-06-11 NOTE — Progress Notes (Unsigned)
Name: Brenda Jones   MRN: [id number]    DOB: [birth year]   Date:06/12/2023       Progress Note  Subjective  Chief Complaint  Follow Up  HPI  HTN: taking bp medication daily, however still on 160/12.5 mg, pharmacy is not dispensing the correct dose. We will contact them today to dispense 320/12.5 mg dose  She continues to have cramping since chemo back in 2013 but gradually improving, takes flexeril daily  . No chest pain, palpitation or dizziness.    GERD: seen by Dr. Lars Pinks, history of h. Pylori, she was treated, no longer taking PPI   DMII: : she  has been on  Metformin for years, A1C used to be in the 6.2 % range but spiked to 6.7 % Spring of 2024 which changed her diagnosis to DM, with associated HTN, obesity. She must have eye exam done, foot exam normal today, urine micro is up to date . 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 Used to see Dr. Orlie Dakin, currently taking vitamin D rx, she had repeat bone density im 02/2020 and it showed mild progression at spine level, femur has been stable Continue vitamin D and calcium supplementation  She had a repeat bone density test 10/2022 that showed mild improvement of bone mass     Insomnia: working 3 am to 3 pm taking Ambien around as soon as she gets home. She is able to sleep 6 hours with medication. Denies side effects . She needs a refill  COPD: She quit smoking for about one month but resumed after father died 2017/11/01. She started smoking at age 63 , currently smoking only a few cigarettes a day and trying to quit again  . She states no longer coughing daily, denies sob or wheezing. Taking Trelegy prn now , she is not interested on lung cancer screen    Muscles Spasms: got much worse after chemotherapy  - in 2013 and takes Cyclobenzaprine to control symptoms, usually in the evening when she gets home from work . It  can be on her trunk or legs, she has tried magnesium and co Q 10 without improvement of symptoms. She is taking flexeril    History of breast cancer left:s/p lumpectomy she completed 5 years of Anastrozole 03/2017. Last  Mammogram was done in 2024, she saw Dr. Orlie Dakin in 2022, she is due for a visit. Advised her to contact his office   Peripheral Neuropathy due to chemotherapy : still has daily pain on both legs and feet also also tips of fingers.  Lyrica did not work for her, Gabapentin no longer making her feel very drowsy but still has pain daily 6/10 and stable . She is still only taking it BID, explained again she can take it TID    Morbid Obesity : BMI 35 with co-morbidities- OA, HTN , GERD, Metabolic syndrome. 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 Valentino Saxon and will go back this Summer   Patient Active Problem List   Diagnosis Date Noted   Hypertension associated with type 2 diabetes mellitus (HCC) 06/12/2023   Vitamin D deficiency 05/10/2022   History of colonic polyps    Polyp of transverse colon    Polyp of sigmoid colon  History of breast cancer 06/24/2019   Morbid obesity (HCC) 03/12/2018   Osteoarthritis of right knee 08/29/2016   Osteopenia due to cancer therapy 04/18/2015   GERD (gastroesophageal reflux disease) 04/18/2015   Muscle spasm 04/18/2015   Benign neoplasm of descending colon    Cervical high risk HPV (human papillomavirus) test positive 03/22/2015   Benign essential HTN 01/08/2015   Insomnia, persistent 01/08/2015   COPD, mild (HCC) 01/08/2015   Dyslipidemia 01/08/2015   Dysmetabolic syndrome 01/08/2015   Morbid obesity (HCC) 01/08/2015   Seasonal allergic rhinitis 01/08/2015   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   Arthralgia of multiple joints 01/08/2015    Past Surgical History:  Procedure  Laterality Date   BLADDER SURGERY  2007   BREAST BIOPSY Left 2013   invasive mammary carcinoma   BREAST BIOPSY Left 04/30/2023   stereo bx, LEFT breast asymmetry, X clip-path pending   BREAST BIOPSY Left 04/30/2023   MM LT BREAST BX W LOC DEV 1ST LESION IMAGE BX SPEC STEREO GUIDE 04/30/2023 [facility]   BREAST LUMPECTOMY Left 2013   invasive mammary carcinoma with 2 positive lymph nodes. Margins were clear.    BREAST SURGERY     COLONOSCOPY WITH PROPOFOL N/A 04/04/2015   Procedure: COLONOSCOPY WITH PROPOFOL;  Surgeon: Midge Minium, MD;  Location: [facility];  Service: Endoscopy;  Laterality: N/A;   COLONOSCOPY WITH PROPOFOL N/A 07/19/2020   Procedure: COLONOSCOPY WITH PROPOFOL;  Surgeon: Midge Minium, MD;  Location: [facility] END[facility]COPY;  Service: Endoscopy;  Laterality: N/A;   POLYPECTOMY  04/04/2015   Procedure: POLYPECTOMY;  Surgeon: Midge Minium, MD;  Location: [facility];  Service: Endoscopy;;    Family History  Problem Relation Age of Onset   Heart disease Mother    Diabetes Father    Hypertension Father    Breast cancer Neg Hx     Social History   Tobacco Use   Smoking status: Every Day    Current packs/day: 0.75    Average packs/day: 0.8 packs/day for 42.0 years (31.5 ttl pk-yrs)    Types: Cigarettes    Start date: 06/27/1981   Smokeless tobacco: Never  Substance Use Topics   Alcohol use: No    Alcohol/week: 0.0 standard drinks of alcohol     Current Outpatient Medications:    Cyanocobalamin (VITAMIN B-12) 1000 MCG SUBL, Place 1 tablet (1,000 mcg total) under the tongue 3 (three) times a week., Disp: 100 tablet, Rfl: 1   cyclobenzaprine (FLEXERIL) 10 MG tablet, Take 1 tablet (10 mg total) by mouth at bedtime., Disp: 90 tablet, Rfl: 1   fluticasone (FLONASE) 50 MCG/ACT nasal spray, PLACE 2 SPRAYS INTO BOTH NOSTRILS AS NEEDED., Disp: 48 mL, Rfl: 1   Fluticasone-Umeclidin-Vilant (TRELEGY ELLIPTA) 100-62.5-25 MCG/ACT AEPB, Inhale 1 puff into the lungs  daily., Disp: 60 each, Rfl: 5   gabapentin (NEURONTIN) 300 MG capsule, Take 1 capsule (300 mg total) by mouth 3 (three) times daily., Disp: 270 capsule, Rfl: 1   loratadine (CLARITIN) 10 MG tablet, Take 1 tablet (10 mg total) by mouth daily as needed (cough, allergies)., Disp: 30 tablet, Rfl: 2   meloxicam (MOBIC) 15 MG tablet, Take 1 tablet (15 mg total) by mouth daily., Disp: 90 tablet, Rfl: 0   metFORMIN (GLUCOPHAGE XR) 500 MG 24 hr tablet, Take 1 tablet (500 mg total) by mouth daily with breakfast., Disp: 90 tablet, Rfl: 1   omeprazole (PRILOSEC) 40 MG capsule, Take 1 capsule (40 mg total) by mouth daily., Disp: 90 capsule,  Rfl: 1   rosuvastatin (CRESTOR) 10 MG tablet, Take 1 tablet (10 mg total) by mouth daily., Disp: 90 tablet, Rfl: 1   valsartan-hydrochlorothiazide (DIOVAN-HCT) 320-12.5 MG tablet, Take 1 tablet by mouth daily. New dose, Disp: 90 tablet, Rfl: 1   Vitamin D, Ergocalciferol, (DRISDOL) 1.25 MG (50000 UNIT) CAPS capsule, Take 1 capsule (50,000 Units total) by mouth every 7 (seven) days., Disp: 12 capsule, Rfl: 1   zolpidem (AMBIEN) 10 MG tablet, Take 1 tablet (10 mg total) by mouth at bedtime., Disp: 90 tablet, Rfl: 1  Allergies  Allergen Reactions   Ace Inhibitors Cough    I personally reviewed active problem list, medication list, allergies, family history, social history, health maintenance with the patient/caregiver today.   ROS  Ten systems reviewed and is negative except as mentioned in HPI    Objective  Vitals:   06/12/23 0919  BP: 134/76  Pulse: 96  Resp: 16  SpO2: 98%  Weight: 216 lb (98 kg)  Height: 5\' 4"  (1.626 m)    Body mass index is 37.08 kg/m.  Physical Exam Constitutional: Patient appears well-developed and well-nourished. Obese  No distress.  HEENT: head atraumatic, normocephalic, pupils equal and reactive to light, neck supple, throat within normal limits Cardiovascular: Normal rate, regular rhythm and normal heart sounds.  No murmur  heard. No BLE edema. Pulmonary/Chest: Effort normal and breath sounds normal. No respiratory distress. Abdominal: Soft.  There is no tenderness. Psychiatric: Patient has a normal mood and affect. behavior is normal. Judgment and thought content normal.    PHQ2/9:    06/12/2023    9:08 AM 02/12/2023    8:09 AM 12/04/2022    7:54 AM 11/09/2022    7:39 AM 05/10/2022    7:39 AM  Depression screen PHQ 2/9  Decreased Interest 0 0 0 0 0  Down, Depressed, Hopeless 0 0 0 0 0  PHQ - 2 Score 0 0 0 0 0  Altered sleeping 0 0 0 0 0  Tired, decreased energy 0 0 0 0 0  Change in appetite 0 0 0 0 0  Feeling bad or failure about yourself  0 0 0 0 0  Trouble concentrating 0 0 0 0 0  Moving slowly or fidgety/restless 0 0 0 0 0  Suicidal thoughts 0 0 0 0 0  PHQ-9 Score 0 0 0 0 0    phq 9 is negative   Fall Risk:    06/12/2023    9:08 AM 02/12/2023    8:09 AM 12/04/2022    7:53 AM 11/09/2022    7:39 AM 05/10/2022    7:39 AM  Fall Risk   Falls in the past year? 0 1 1 1  0  Number falls in past yr: 0 0 0 0 0  Injury with Fall? 0 1 1 0 0  Risk for fall due to : Impaired balance/gait History of fall(s) No Fall Risks No Fall Risks No Fall Risks  Follow up Falls prevention discussed Falls prevention discussed;Education provided;Falls evaluation completed Falls prevention discussed Falls prevention discussed Falls prevention discussed      Functional Status Survey: Is the patient deaf or have difficulty hearing?: No Does the patient have difficulty seeing, even when wearing glasses/contacts?: No Does the patient have difficulty concentrating, remembering, or making decisions?: No Does the patient have difficulty walking or climbing stairs?: Yes Does the patient have difficulty dressing or bathing?: No Does the patient have difficulty doing errands alone such as visiting a doctor's office or  shopping?: No    Assessment & Plan  1. Hypertension associated with type 2 diabetes mellitus (HCC)  -  POCT HgB A1C - [facility] Diabetes Foot Exam - metFORMIN (GLUCOPHAGE-XR) 750 MG 24 hr tablet; Take 1 tablet (750 mg total) by mouth daily with breakfast.  Dispense: 90 tablet; Refill: 1  2. COPD, mild (HCC)  Continue Trelegy   3. Peripheral neuropathy due to chemotherapy (HCC)  - gabapentin (NEURONTIN) 300 MG capsule; Take 1 capsule (300 mg total) by mouth 3 (three) times daily.  Dispense: 270 capsule; Refill: 1  4. Morbid obesity (HCC)  Discussed with the patient the risk posed by an increased BMI. Discussed importance of portion control, calorie counting and at least 150 minutes of physical activity weekly. Avoid sweet beverages and drink more water. Eat at least 6 servings of fruit and vegetables daily    5. Benign essential HTN  - valsartan-hydrochlorothiazide (DIOVAN-HCT) 320-12.5 MG tablet; Take 1 tablet by mouth daily. New dose  Dispense: 90 tablet; Refill: 1  6. Insomnia, persistent  - zolpidem (AMBIEN) 10 MG tablet; Take 1 tablet (10 mg total) by mouth at bedtime.  Dispense: 90 tablet; Refill: 1  7. Vitamin D deficiency  - Vitamin D, Ergocalciferol, (DRISDOL) 1.25 MG (50000 UNIT) CAPS capsule; Take 1 capsule (50,000 Units total) by mouth every 7 (seven) days.  Dispense: 12 capsule; Refill: 1  8. Dyslipidemia  - rosuvastatin (CRESTOR) 10 MG tablet; Take 1 tablet (10 mg total) by mouth daily.  Dispense: 90 tablet; Refill: 1  9. Myalgia  - cyclobenzaprine (FLEXERIL) 10 MG tablet; Take 1 tablet (10 mg total) by mouth at bedtime.  Dispense: 90 tablet; Refill: 1  10. Needs flu shot  - Flu vaccine trivalent PF, 6mos and older(Flulaval,Afluria,Fluarix,Fluzone)

## 2023-06-12 ENCOUNTER — Encounter: Payer: Self-pay | Admitting: Family Medicine

## 2023-06-12 ENCOUNTER — Ambulatory Visit: Payer: BC Managed Care – PPO | Admitting: Family Medicine

## 2023-06-12 VITALS — BP 134/76 | HR 96 | Resp 16 | Ht 64.0 in | Wt 216.0 lb

## 2023-06-12 DIAGNOSIS — J449 Chronic obstructive pulmonary disease, unspecified: Secondary | ICD-10-CM

## 2023-06-12 DIAGNOSIS — I152 Hypertension secondary to endocrine disorders: Secondary | ICD-10-CM

## 2023-06-12 DIAGNOSIS — G4700 Insomnia, unspecified: Secondary | ICD-10-CM

## 2023-06-12 DIAGNOSIS — Z7984 Long term (current) use of oral hypoglycemic drugs: Secondary | ICD-10-CM

## 2023-06-12 DIAGNOSIS — E559 Vitamin D deficiency, unspecified: Secondary | ICD-10-CM

## 2023-06-12 DIAGNOSIS — G620 Drug-induced polyneuropathy: Secondary | ICD-10-CM | POA: Diagnosis not present

## 2023-06-12 DIAGNOSIS — E785 Hyperlipidemia, unspecified: Secondary | ICD-10-CM

## 2023-06-12 DIAGNOSIS — Z23 Encounter for immunization: Secondary | ICD-10-CM

## 2023-06-12 DIAGNOSIS — M791 Myalgia, unspecified site: Secondary | ICD-10-CM

## 2023-06-12 DIAGNOSIS — E1159 Type 2 diabetes mellitus with other circulatory complications: Secondary | ICD-10-CM | POA: Diagnosis not present

## 2023-06-12 DIAGNOSIS — I10 Essential (primary) hypertension: Secondary | ICD-10-CM

## 2023-06-12 LAB — POCT GLYCOSYLATED HEMOGLOBIN (HGB A1C): Hemoglobin A1C: 6.3 % — AB (ref 4.0–5.6)

## 2023-06-12 MED ORDER — METFORMIN HCL ER 750 MG PO TB24
750.0000 mg | ORAL_TABLET | Freq: Every day | ORAL | 1 refills | Status: DC
Start: 2023-06-12 — End: 2024-01-27

## 2023-06-12 MED ORDER — VITAMIN D (ERGOCALCIFEROL) 1.25 MG (50000 UNIT) PO CAPS
50000.0000 [IU] | ORAL_CAPSULE | ORAL | 1 refills | Status: DC
Start: 2023-06-12 — End: 2024-02-11

## 2023-06-12 MED ORDER — CYCLOBENZAPRINE HCL 10 MG PO TABS
10.0000 mg | ORAL_TABLET | Freq: Every day | ORAL | 1 refills | Status: DC
Start: 2023-06-12 — End: 2024-02-11

## 2023-06-12 MED ORDER — GABAPENTIN 300 MG PO CAPS
300.0000 mg | ORAL_CAPSULE | Freq: Three times a day (TID) | ORAL | 1 refills | Status: DC
Start: 2023-06-12 — End: 2024-02-11

## 2023-06-12 MED ORDER — ZOLPIDEM TARTRATE 10 MG PO TABS
10.0000 mg | ORAL_TABLET | Freq: Every day | ORAL | 1 refills | Status: DC
Start: 2023-06-12 — End: 2024-02-11

## 2023-06-12 MED ORDER — VALSARTAN-HYDROCHLOROTHIAZIDE 320-12.5 MG PO TABS
1.0000 | ORAL_TABLET | Freq: Every day | ORAL | 1 refills | Status: DC
Start: 2023-06-12 — End: 2024-02-11

## 2023-06-12 MED ORDER — ROSUVASTATIN CALCIUM 10 MG PO TABS
10.0000 mg | ORAL_TABLET | Freq: Every day | ORAL | 1 refills | Status: DC
Start: 2023-06-12 — End: 2024-01-27

## 2023-06-17 ENCOUNTER — Ambulatory Visit: Payer: BC Managed Care – PPO | Admitting: Family Medicine

## 2023-07-06 ENCOUNTER — Other Ambulatory Visit: Payer: Self-pay | Admitting: Family Medicine

## 2023-07-06 DIAGNOSIS — I10 Essential (primary) hypertension: Secondary | ICD-10-CM

## 2023-07-08 NOTE — Telephone Encounter (Signed)
Requested by interface surescripts. Medication dose discontinued 11/09/22.  Requested Prescriptions  Refused Prescriptions Disp Refills   valsartan-hydrochlorothiazide (DIOVAN-HCT) 160-12.5 MG tablet [Pharmacy Med Name: VALSARTAN-HCTZ 160-12.5 MG TAB] 90 tablet 1    Sig: TAKE 1 TABLET BY MOUTH DAILY FOR BLOOD PRESSURE     Cardiovascular: ARB + Diuretic Combos Failed - 07/08/2023  8:50 AM      Failed - K in normal range and within 180 days    Potassium  Date Value Ref Range Status  11/09/2022 4.4 3.5 - 5.3 mmol/L Final  01/25/2012 3.6 3.5 - 5.1 mmol/L Final         Failed - Na in normal range and within 180 days    Sodium  Date Value Ref Range Status  11/09/2022 140 135 - 146 mmol/L Final  01/25/2012 139 136 - 145 mmol/L Final         Failed - Cr in normal range and within 180 days    Creat  Date Value Ref Range Status  11/09/2022 0.82 0.50 - 1.03 mg/dL Final         Failed - eGFR is 10 or above and within 180 days    GFR, Est African American  Date Value Ref Range Status  12/14/2020 101 > OR = 60 mL/min/1.63m2 Final   GFR, Est Non African American  Date Value Ref Range Status  12/14/2020 87 > OR = 60 mL/min/1.69m2 Final   eGFR  Date Value Ref Range Status  11/09/2022 83 > OR = 60 mL/min/1.87m2 Final         Passed - Patient is not pregnant      Passed - Last BP in normal range    BP Readings from Last 1 Encounters:  06/12/23 134/76         Passed - Valid encounter within last 6 months    Recent Outpatient Visits           3 weeks ago Hypertension associated with type 2 diabetes mellitus [facility])   [place] [facility] Alba Cory, MD   4 months ago Hypertension associated with type 2 diabetes mellitus [facility])   [place] [facility] Alba Cory, MD   7 months ago Well adult exam   [facility] Alba Cory, MD   8 months ago Benign essential HTN   [facility]  Alba Cory, MD   1 year ago Benign essential HTN   [facility] Alba Cory, MD       Future Appointments             In 3 months Carlynn Purl, Danna Hefty, MD [facility]   In 5 months Alba Cory, MD [facility], [facility]

## 2023-09-30 ENCOUNTER — Ambulatory Visit: Admitting: Family Medicine

## 2023-09-30 ENCOUNTER — Encounter: Payer: Self-pay | Admitting: Family Medicine

## 2023-09-30 VITALS — BP 152/88 | HR 91 | Resp 16 | Ht 64.0 in | Wt 210.2 lb

## 2023-09-30 DIAGNOSIS — I10 Essential (primary) hypertension: Secondary | ICD-10-CM

## 2023-09-30 DIAGNOSIS — R058 Other specified cough: Secondary | ICD-10-CM | POA: Diagnosis not present

## 2023-09-30 DIAGNOSIS — J441 Chronic obstructive pulmonary disease with (acute) exacerbation: Secondary | ICD-10-CM

## 2023-09-30 DIAGNOSIS — R0602 Shortness of breath: Secondary | ICD-10-CM | POA: Diagnosis not present

## 2023-09-30 MED ORDER — PREDNISONE 10 MG PO TABS
10.0000 mg | ORAL_TABLET | Freq: Two times a day (BID) | ORAL | 0 refills | Status: DC
Start: 2023-09-30 — End: 2024-02-04

## 2023-09-30 MED ORDER — AZITHROMYCIN 250 MG PO TABS
ORAL_TABLET | ORAL | 0 refills | Status: AC
Start: 2023-09-30 — End: 2023-10-05

## 2023-09-30 NOTE — Progress Notes (Signed)
 Name: Brenda Jones   MRN: [id number]    DOB: Dec 10, 1965   Date:09/30/2023       Progress Note  Subjective  Chief Complaint  Chief Complaint  Patient presents with   Headache   Cough    Sx on going for 3 weeks not any better    Discussed the use of AI scribe software for clinical note transcription with the patient, who gave verbal consent to proceed.  History of Present Illness   The patient, with COPD, presents with persistent cough and fatigue.  She has been experiencing a persistent cough and fatigue for over three weeks. Initially, she had cold symptoms, a lack of appetite, and a severe cough. She stayed home for six days before returning to work, but continued to feel unwell. She took two COVID tests, both of which were negative.  Currently, she continues to experience a persistent cough, fatigue, and a headache localized to the right side of her head. The cough, initially dry, is now productive with thick  sputum. She notes that when she first got sick, she coughed up a little blood, but now the sputum is clearer. She also reports morning congestion that improves slightly during the day and experiences some shortness of breath, which is new since she became ill.  She has a history of COPD and uses Trelegy, which she used this morning. She smokes and is concerned about lung infections. She reports that sometimes Trelegy seems to help with her symptoms.  She has a history of hypertension and is currently taking valsartan HCTZ, which she took this morning. Her blood pressure is noted to be high today despite taking her medication. She also took Benadryl and some cold and flu medicine about a week ago.        Patient Active Problem List   Diagnosis Date Noted   Hypertension associated with type 2 diabetes mellitus (HCC) 06/12/2023   Vitamin D deficiency 05/10/2022   History of colonic polyps    Polyp of transverse colon    Polyp of sigmoid colon    History of breast cancer  06/24/2019   Morbid obesity (HCC) 03/12/2018   Osteoarthritis of right knee 08/29/2016   Osteopenia due to cancer therapy 04/18/2015   GERD (gastroesophageal reflux disease) 04/18/2015   Muscle spasm 04/18/2015   Benign neoplasm of descending colon    Cervical high risk HPV (human papillomavirus) test positive 03/22/2015   Benign essential HTN 01/08/2015   Insomnia, persistent 01/08/2015   COPD, mild (HCC) 01/08/2015   Dyslipidemia 01/08/2015   Dysmetabolic syndrome 01/08/2015   Morbid obesity (HCC) 01/08/2015   Seasonal allergic rhinitis 01/08/2015   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   Arthralgia of multiple joints 01/08/2015    Social History   Tobacco Use   Smoking status: Every Day    Current packs/day: 0.75    Average packs/day: 0.7 packs/day for 42.3 years (31.7 ttl pk-yrs)    Types: Cigarettes    Start date: 06/27/1981   Smokeless tobacco: Never  Substance Use Topics   Alcohol use: No    Alcohol/week: 0.0 standard drinks of alcohol     Current Outpatient Medications:    azithromycin (ZITHROMAX) 250 MG tablet, Take 2 tablets on day 1, then 1 tablet daily on days 2 through 5, Disp: 6 tablet, Rfl: 0   Cyanocobalamin (VITAMIN B-12) 1000 MCG SUBL, Place 1 tablet (1,000 mcg total) under the tongue 3 (three) times a week., Disp: 100 tablet, Rfl: 1  cyclobenzaprine (FLEXERIL) 10 MG tablet, Take 1 tablet (10 mg total) by mouth at bedtime., Disp: 90 tablet, Rfl: 1   fluticasone (FLONASE) 50 MCG/ACT nasal spray, PLACE 2 SPRAYS INTO BOTH NOSTRILS AS NEEDED., Disp: 48 mL, Rfl: 1   Fluticasone-Umeclidin-Vilant (TRELEGY ELLIPTA) 100-62.5-25 MCG/ACT AEPB, Inhale 1 puff into the lungs daily., Disp: 60 each, Rfl: 5   gabapentin (NEURONTIN) 300 MG capsule, Take 1 capsule (300 mg total) by mouth 3 (three) times daily., Disp: 270 capsule, Rfl: 1   loratadine (CLARITIN) 10 MG tablet, Take 1 tablet (10 mg total) by mouth daily as needed (cough, allergies)., Disp: 30  tablet, Rfl: 2   meloxicam (MOBIC) 15 MG tablet, Take 1 tablet (15 mg total) by mouth daily., Disp: 90 tablet, Rfl: 0   metFORMIN (GLUCOPHAGE-XR) 750 MG 24 hr tablet, Take 1 tablet (750 mg total) by mouth daily with breakfast., Disp: 90 tablet, Rfl: 1   predniSONE (DELTASONE) 10 MG tablet, Take 1 tablet (10 mg total) by mouth 2 (two) times daily with a meal., Disp: 10 tablet, Rfl: 0   rosuvastatin (CRESTOR) 10 MG tablet, Take 1 tablet (10 mg total) by mouth daily., Disp: 90 tablet, Rfl: 1   valsartan-hydrochlorothiazide (DIOVAN-HCT) 320-12.5 MG tablet, Take 1 tablet by mouth daily. New dose, Disp: 90 tablet, Rfl: 1   Vitamin D, Ergocalciferol, (DRISDOL) 1.25 MG (50000 UNIT) CAPS capsule, Take 1 capsule (50,000 Units total) by mouth every 7 (seven) days., Disp: 12 capsule, Rfl: 1   zolpidem (AMBIEN) 10 MG tablet, Take 1 tablet (10 mg total) by mouth at bedtime., Disp: 90 tablet, Rfl: 1  Allergies  Allergen Reactions   Ace Inhibitors Cough    ROS  Ten systems reviewed and is negative except as mentioned in HPI    Objective  Vitals:   09/30/23 1538 09/30/23 1618  BP: (!) 162/98 (!) 152/88  Pulse: 91   Resp: 16   SpO2: 99%   Weight: 210 lb 3.2 oz (95.3 kg)   Height: 5\' 4"  (1.626 m)     Body mass index is 36.08 kg/m.    Physical Exam  Constitutional: Patient appears well-developed and well-nourished. Obese  No distress.  HEENT: head atraumatic, normocephalic, pupils equal and reactive to light, ears normal TM , neck supple Cardiovascular: Normal rate, regular rhythm and normal heart sounds.  No murmur heard. No BLE edema. Pulmonary/Chest: Effort normal and breath sounds normal. No respiratory distress. Abdominal: Soft.  There is no tenderness. Psychiatric: Patient has a normal mood and affect. behavior is normal. Judgment and thought content normal.   Assessment and Plan    COPD exacerbation Suspected COPD exacerbation, likely due to bacterial bronchitis. Treatment aimed at  reducing inflammation and addressing bacterial infection. - Prescribe prednisone 10 mg twice daily with meals. - Prescribe azithromycin (Z-Pak). - Advise monitoring symptoms and report improvement within 48 hours. Plan chest x-ray if no improvement.  Hypertension Blood pressure elevated despite valsartan HCTZ. No recent use of OTC cold medications affecting blood pressure. - Continue valsartan HCTZ as prescribed. - Advise use of Coricidin HBP for congestion. - Monitor blood pressure and report significant changes.

## 2023-10-17 ENCOUNTER — Ambulatory Visit: Payer: BC Managed Care – PPO | Admitting: Family Medicine

## 2023-10-21 ENCOUNTER — Ambulatory Visit: Payer: Self-pay | Admitting: Family Medicine

## 2023-10-21 ENCOUNTER — Encounter: Payer: Self-pay | Admitting: Family Medicine

## 2023-10-21 VITALS — BP 134/84 | HR 99 | Resp 16 | Ht 64.0 in | Wt 209.8 lb

## 2023-10-21 DIAGNOSIS — E559 Vitamin D deficiency, unspecified: Secondary | ICD-10-CM

## 2023-10-21 DIAGNOSIS — G4700 Insomnia, unspecified: Secondary | ICD-10-CM

## 2023-10-21 DIAGNOSIS — I152 Hypertension secondary to endocrine disorders: Secondary | ICD-10-CM

## 2023-10-21 DIAGNOSIS — I10 Essential (primary) hypertension: Secondary | ICD-10-CM

## 2023-10-21 DIAGNOSIS — J449 Chronic obstructive pulmonary disease, unspecified: Secondary | ICD-10-CM

## 2023-10-21 DIAGNOSIS — Z7984 Long term (current) use of oral hypoglycemic drugs: Secondary | ICD-10-CM

## 2023-10-21 DIAGNOSIS — E1159 Type 2 diabetes mellitus with other circulatory complications: Secondary | ICD-10-CM

## 2023-10-21 DIAGNOSIS — Z720 Tobacco use: Secondary | ICD-10-CM

## 2023-10-21 DIAGNOSIS — K219 Gastro-esophageal reflux disease without esophagitis: Secondary | ICD-10-CM

## 2023-10-21 DIAGNOSIS — G620 Drug-induced polyneuropathy: Secondary | ICD-10-CM | POA: Diagnosis not present

## 2023-10-21 LAB — POCT GLYCOSYLATED HEMOGLOBIN (HGB A1C): Hemoglobin A1C: 6.3 % — AB (ref 4.0–5.6)

## 2023-10-21 MED ORDER — FLUTICASONE-UMECLIDIN-VILANT 100-62.5-25 MCG/ACT IN AEPB: 1.0000 | INHALATION_SPRAY | Freq: Every day | RESPIRATORY_TRACT | 5 refills | Status: AC

## 2023-10-21 NOTE — Progress Notes (Signed)
 Name: Brenda Jones   MRN: [id number]    DOB: 1966/06/21   Date:10/21/2023       Progress Note  Subjective  Chief Complaint  Chief Complaint  Patient presents with   Medical Management of Chronic Issues   HPI   HTN: taking bp medication daily, however still on 320/12.5 mg and bp is at goal, no chest pain or palpitation    GERD: seen by Dr. Lars Pinks, history of h. Pylori, she was treated, no longer taking PPI .Doing well    DMII: : she  has been on  Metformin for years, A1C used to be in the 6.2 % range but spiked to 6.7 % Spring of 2024 which changed her diagnosis to DM, with associated HTN, obesity. She must have eye exam done, foot exam normal today, urine micro is up to date .Current A1C is at goal at 6.3 %. Denies side effects of Metformin, we will recheck labs and urine micro next visit, also needs yearly eye exam    Osteopenia: secondary to cancer therapy. Used to see Dr. Orlie Dakin, currently taking vitamin D rx, she had repeat bone density im 02/2020 and it showed mild progression at spine level, femur has been stable Continue vitamin D and calcium supplementation  She had a repeat bone density test 10/2022 that showed mild improvement of bone mass . Unchanged     Insomnia: working 3 am to 3 pm taking Ambien around as soon as she gets home. She is able to sleep 6 hours with medication. Denies side effects .Explained risk of taking Ambien and flexeril at the same time    COPD: She quit smoking for about one month but resumed after father died 2017-10-02. She started smoking at age 73 , currently smoking only a few cigarettes a day and trying to quit again  .She had a flare about 2 weeks ago, took Zpack and prednisone but still has a productive cough but feeling much better. Less productive now, still using Trelegy but seems like it is an expired medication. She is feeling some SOB with activity since she got sick. She has some wheezing in the mornings.    Muscles Spasms: got much worse after  chemotherapy  - in 2013 and takes Cyclobenzaprine. It can be on her trunk or legs, she has tried magnesium and co Q 10 without improvement of symptoms. She takes Flexeril before bed time and seems to help with nocturnal symptoms    History of breast cancer left:s/p lumpectomy she completed 5 years of Anastrozole 03/2017. Last  Mammogram was done in 2024, she saw Dr. Orlie Dakin in 2022, she is due for a visit but has not contacted his office. We will reach out to him to get her back on the scheduled    Peripheral Neuropathy due to chemotherapy : still has daily pain on both legs and feet also also tips of fingers.  Lyrica did not work for her, Gabapentin no longer making her feel very drowsy . She is now taking Gabapentin TID instead of BID . Average pain is 4-5 and can up 7-8   Morbid Obesity : BMI 35 with co-morbidities- OA, HTN , GERD, Metabolic syndrome. She is down to one soda per day . Discussed portion control and cut down on carbohydrates    B12 Deficiency and vitamin D deficiency: continue supplements , continue supplementation    Atypical glandular cells on cervix: seen by Dr. Valentino Saxon and will go back this Summer, she will return in May  for repeat pap smear in May     Patient Active Problem List   Diagnosis Date Noted   Hypertension associated with type 2 diabetes mellitus (HCC) 06/12/2023   Vitamin D deficiency 05/10/2022   History of colonic polyps    Polyp of transverse colon    Polyp of sigmoid colon    History of breast cancer 06/24/2019   Osteoarthritis of right knee 08/29/2016   Osteopenia due to cancer therapy 04/18/2015   GERD (gastroesophageal reflux disease) 04/18/2015   Muscle spasm 04/18/2015   Benign neoplasm of descending colon    Cervical high risk HPV (human papillomavirus) test positive 03/22/2015   Benign essential HTN 01/08/2015   Insomnia, persistent 01/08/2015   COPD, mild (HCC) 01/08/2015   Dyslipidemia 01/08/2015   Dysmetabolic syndrome 01/08/2015   Morbid  obesity (HCC) 01/08/2015   Seasonal allergic rhinitis 01/08/2015   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 Margins were clear.    BREAST SURGERY     COLONOSCOPY WITH PROPOFOL N/A 04/04/2015   Procedure: COLONOSCOPY WITH PROPOFOL;  Surgeon: Midge Minium, MD;  Location: [facility];  Service: Endoscopy;  Laterality: N/A;   COLONOSCOPY WITH PROPOFOL N/A 07/19/2020   Procedure: COLONOSCOPY WITH PROPOFOL;  Surgeon: Midge Minium, MD;  Location: [facility];  Service: Endoscopy;  Laterality: N/A;   POLYPECTOMY  04/04/2015   Procedure: POLYPECTOMY;  Surgeon: Midge Minium, MD;  Location[facility];  Service: Endoscopy;;    Family History  Problem Relation Age of Onset   Heart disease Mother    Diabetes Father    Hypertension Father    Breast cancer Neg Hx     Social History   Tobacco Use   Smoking status: Every Day    Current packs/day: 0.75    Average packs/day: 0.8 packs/day for 42.3 years (31.7 ttl pk-yrs)    Types: Cigarettes    Start date: 06/27/1981   Smokeless tobacco: Never  Substance Use Topics   Alcohol use: No    Alcohol/week: 0.0 standard drinks of alcohol     Current Outpatient Medications:    Cyanocobalamin (VITAMIN B-12) 1000 MCG SUBL, Place 1 tablet (1,000 mcg total) under the tongue 3 (three) times a week., Disp: 100 tablet, Rfl: 1   cyclobenzaprine (FLEXERIL) 10 MG tablet, Take 1 tablet (10 mg total) by mouth at bedtime., Disp: 90 tablet, Rfl: 1    fluticasone (FLONASE) 50 MCG/ACT nasal spray, PLACE 2 SPRAYS INTO BOTH NOSTRILS AS NEEDED., Disp: 48 mL, Rfl: 1   Fluticasone-Umeclidin-Vilant (TRELEGY ELLIPTA) 100-62.5-25 MCG/ACT AEPB, Inhale 1 puff into the lungs daily., Disp: 60 each, Rfl: 5   gabapentin (NEURONTIN) 300 MG capsule, Take 1 capsule (300 mg total) by mouth 3 (three) times daily., Disp: 270 capsule, Rfl: 1   loratadine (CLARITIN) 10 MG tablet, Take 1 tablet (10 mg total) by mouth daily as needed (cough, allergies)., Disp: 30 tablet, Rfl: 2   meloxicam (MOBIC) 15 MG tablet, Take 1 tablet (15 mg total)  by mouth daily., Disp: 90 tablet, Rfl: 0   metFORMIN (GLUCOPHAGE-XR) 750 MG 24 hr tablet, Take 1 tablet (750 mg total) by mouth daily with breakfast., Disp: 90 tablet, Rfl: 1   predniSONE (DELTASONE) 10 MG tablet, Take 1 tablet (10 mg total) by mouth 2 (two) times daily with a meal., Disp: 10 tablet, Rfl: 0   rosuvastatin (CRESTOR) 10 MG tablet, Take 1 tablet (10 mg total) by mouth daily., Disp: 90 tablet, Rfl: 1   valsartan-hydrochlorothiazide (DIOVAN-HCT) 320-12.5 MG tablet, Take 1 tablet by mouth daily. New dose, Disp: 90 tablet, Rfl: 1   Vitamin D, Ergocalciferol, (DRISDOL) 1.25 MG (50000 UNIT) CAPS capsule, Take 1 capsule (50,000 Units total) by mouth every 7 (seven) days., Disp: 12 capsule, Rfl: 1   zolpidem (AMBIEN) 10 MG tablet, Take 1 tablet (10 mg total) by mouth at bedtime., Disp: 90 tablet, Rfl: 1  Allergies  Allergen Reactions   Ace Inhibitors Cough    I personally reviewed active problem list, medication list, allergies with the patient/caregiver today.   ROS  Ten systems reviewed and is negative except as mentioned in HPI    Objective Physical Exam Constitutional: Patient appears well-developed and well-nourished. Obese  No distress.  HEENT: head atraumatic, normocephalic, pupils equal and reactive to light, neck supple Cardiovascular: Normal rate, regular rhythm and normal heart sounds.  No murmur heard. No  BLE edema. Pulmonary/Chest: Effort normal and breath sounds normal. No respiratory distress. Abdominal: Soft.  There is no tenderness. Psychiatric: Patient has a normal mood and affect. behavior is normal. Judgment and thought content normal.   Vitals:   10/21/23 0844  BP: 134/84  Pulse: 99  Resp: 16  SpO2: 99%  Weight: 209 lb 12.8 oz (95.2 kg)  Height: 5\' 4"  (1.626 m)    Body mass index is 36.01 kg/m.  Recent Results (from the past 2160 hours)  POCT glycosylated hemoglobin (Hb A1C)     Status: Abnormal   Collection Time: 10/21/23  8:52 AM  Result Value Ref Range   Hemoglobin A1C 6.3 (A) 4.0 - 5.6 %   HbA1c POC (<> result, manual entry)     HbA1c, POC (prediabetic range)     HbA1c, POC (controlled diabetic range)      Diabetic Foot Exam:     PHQ2/9:    09/30/2023    3:36 PM 06/12/2023    9:08 AM 02/12/2023    8:09 AM 12/04/2022    7:54 AM 11/09/2022    7:39 AM  Depression screen PHQ 2/9  Decreased Interest 0 0 0 0 0  Down, Depressed, Hopeless 0 0 0 0 0  PHQ - 2 Score 0 0 0 0 0  Altered sleeping  0 0 0 0  Tired, decreased energy  0 0 0 0  Change in appetite  0 0 0 0  Feeling bad or failure about yourself   0 0 0 0  Trouble concentrating  0 0 0 0  Moving slowly or fidgety/restless  0 0 0 0  Suicidal thoughts  0 0 0 0  PHQ-9 Score  0 0 0 0    phq 9 is negative  Fall Risk:    06/12/2023    9:08 AM 02/12/2023    8:09 AM 12/04/2022    7:53 AM 11/09/2022    7:39 AM 05/10/2022    7:39 AM  Fall Risk   Falls in the past year? 0 1 1 1  0  Number falls in past yr: 0 0 0  0 0  Injury with Fall? 0 1 1 0 0  Risk for fall due to : Impaired balance/gait History of fall(s) No Fall Risks No Fall Risks No Fall Risks  Follow up Falls prevention discussed Falls prevention discussed;Education provided;Falls evaluation completed Falls prevention discussed Falls prevention discussed Falls prevention discussed     Assessment & Plan

## 2023-10-29 ENCOUNTER — Other Ambulatory Visit: Payer: Self-pay | Admitting: Family Medicine

## 2023-10-29 DIAGNOSIS — I10 Essential (primary) hypertension: Secondary | ICD-10-CM

## 2023-11-01 ENCOUNTER — Encounter: Payer: Self-pay | Admitting: Oncology

## 2023-11-01 ENCOUNTER — Inpatient Hospital Stay: Attending: Oncology | Admitting: Oncology

## 2023-11-01 VITALS — BP 145/85 | HR 92 | Temp 99.0°F | Resp 18 | Ht 64.0 in | Wt 207.0 lb

## 2023-11-01 DIAGNOSIS — I10 Essential (primary) hypertension: Secondary | ICD-10-CM | POA: Diagnosis not present

## 2023-11-01 DIAGNOSIS — G629 Polyneuropathy, unspecified: Secondary | ICD-10-CM | POA: Diagnosis not present

## 2023-11-01 DIAGNOSIS — F17210 Nicotine dependence, cigarettes, uncomplicated: Secondary | ICD-10-CM | POA: Diagnosis not present

## 2023-11-01 DIAGNOSIS — Z833 Family history of diabetes mellitus: Secondary | ICD-10-CM | POA: Insufficient documentation

## 2023-11-01 DIAGNOSIS — Z79899 Other long term (current) drug therapy: Secondary | ICD-10-CM | POA: Diagnosis not present

## 2023-11-01 DIAGNOSIS — Z853 Personal history of malignant neoplasm of breast: Secondary | ICD-10-CM

## 2023-11-01 DIAGNOSIS — C50212 Malignant neoplasm of upper-inner quadrant of left female breast: Secondary | ICD-10-CM | POA: Diagnosis not present

## 2023-11-01 DIAGNOSIS — Z8249 Family history of ischemic heart disease and other diseases of the circulatory system: Secondary | ICD-10-CM | POA: Diagnosis not present

## 2023-11-01 DIAGNOSIS — M8580 Other specified disorders of bone density and structure, unspecified site: Secondary | ICD-10-CM | POA: Diagnosis not present

## 2023-11-01 DIAGNOSIS — E669 Obesity, unspecified: Secondary | ICD-10-CM | POA: Diagnosis not present

## 2023-11-01 DIAGNOSIS — Z9221 Personal history of antineoplastic chemotherapy: Secondary | ICD-10-CM | POA: Insufficient documentation

## 2023-11-01 DIAGNOSIS — Z1721 Progesterone receptor positive status: Secondary | ICD-10-CM | POA: Insufficient documentation

## 2023-11-01 DIAGNOSIS — J449 Chronic obstructive pulmonary disease, unspecified: Secondary | ICD-10-CM | POA: Insufficient documentation

## 2023-11-01 DIAGNOSIS — Z170 Estrogen receptor positive status [ER+]: Secondary | ICD-10-CM | POA: Insufficient documentation

## 2023-11-01 DIAGNOSIS — G4700 Insomnia, unspecified: Secondary | ICD-10-CM | POA: Diagnosis not present

## 2023-11-01 NOTE — Progress Notes (Signed)
 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  INTERVAL HISTORY: Patient last seen in clinic in September 2022 and was referred back for routine evaluation.  She continues to feel well and remains asymptomatic.  She had a suspicious area in her left breast on mammogram in September 2024, but biopsy on April 30, 2023 was negative for malignancy.  She has no neurologic complaints.  She denies any recent fevers or illnesses.  She has a good appetite and denies weight loss.  She denies any chest pain, shortness of breath, cough, or hemoptysis.  She denies any nausea, vomiting, constipation, or diarrhea. She has no urinary complaints.  Patient offers no specific complaints today.  REVIEW OF SYSTEMS:   Review of Systems  Constitutional: Negative.  Negative for fever, malaise/fatigue and weight loss.  Respiratory: Negative.  Negative for cough and shortness of breath.   Cardiovascular: Negative.  Negative for chest pain and leg swelling.  Gastrointestinal: Negative.  Negative for abdominal pain.  Genitourinary: Negative.  Negative for dysuria.  Musculoskeletal: Negative.  Negative for joint pain.  Skin: Negative.  Negative for rash.  Neurological: Negative.  Negative for dizziness, sensory change, weakness and headaches.  Psychiatric/Behavioral: Negative.  The patient is not nervous/anxious.     As per HPI. Otherwise, a complete review of systems is negative.  PAST MEDICAL HISTORY: Past Medical History:  Diagnosis Date   Allergy    Arthritis    Breast CA (HCC)    Breast cancer (HCC) 2013   left breast lumpectomy with chemo and rad tx   COPD (chronic obstructive pulmonary disease) (HCC)     Headache    occasional - thinks it is from BP meds   Hypertension    Insomnia    Metabolic syndrome    Obesity    Peripheral neuropathy    Personal history of chemotherapy    Personal history of radiation therapy     PAST SURGICAL HISTORY: Past Surgical History:  Procedure Laterality Date   BLADDER SURGERY  2007   BREAST BIOPSY Left 2013   invasive mammary carcinoma   BREAST BIOPSY Left 04/30/2023   stereo bx, LEFT breast asymmetry, X clip-path pending   BREAST BIOPSY Left 04/30/2023   MM LT BREAST BX W LOC DEV 1ST LESION IMAGE BX SPEC STEREO GUIDE 04/30/2023 [facility]   BREAST LUMPECTOMY Left 2013   invasive mammary carcinoma with 2 positive lymph nodes. Margins were clear.    BREAST SURGERY     COLONOSCOPY WITH PROPOFOL N/A 04/04/2015   Procedure: COLONOSCOPY WITH PROPOFOL;  Surgeon: Midge Minium, MD;  Location: [facility];  Service: Endoscopy;  Laterality: N/A;   COLONOSCOPY WITH PROPOFOL N/A 07/19/2020   Procedure: COLONOSCOPY WITH PROPOFOL;  Surgeon: Midge Minium, MD;  Location: [facility] END[facility];  Service: Endoscopy;  Laterality: N/A;   POLYPECTOMY  04/04/2015   Procedure: POLYPECTOMY;  Surgeon: Midge Minium, MD;  Location: [facility];  Service: Endoscopy;;    FAMILY HISTORY Family History  Problem Relation Age of Onset   Heart disease Mother    Diabetes Father    Hypertension Father    Breast cancer Neg Hx        ADVANCED DIRECTIVES:    HEALTH MAINTENANCE: Social History  Tobacco Use   Smoking status: Every Day    Current packs/day: 0.75    Average packs/day: 0.7 packs/day for 42.3 years (31.8 ttl pk-yrs)    Types: Cigarettes    Start date: 06/27/1981   Smokeless tobacco: Never  Vaping Use   Vaping status: Never Used  Substance Use Topics   Alcohol use: No    Alcohol/week: 0.0 standard drinks of alcohol   Drug use: No     Allergies  Allergen Reactions   Ace Inhibitors Cough    Current Outpatient Medications  Medication  Sig Dispense Refill   Cyanocobalamin (VITAMIN B-12) 1000 MCG SUBL Place 1 tablet (1,000 mcg total) under the tongue 3 (three) times a week. 100 tablet 1   cyclobenzaprine (FLEXERIL) 10 MG tablet Take 1 tablet (10 mg total) by mouth at bedtime. 90 tablet 1   fluticasone (FLONASE) 50 MCG/ACT nasal spray PLACE 2 SPRAYS INTO BOTH NOSTRILS AS NEEDED. 48 mL 1   Fluticasone-Umeclidin-Vilant (TRELEGY ELLIPTA) 100-62.5-25 MCG/ACT AEPB Inhale 1 puff into the lungs daily. 60 each 5   gabapentin (NEURONTIN) 300 MG capsule Take 1 capsule (300 mg total) by mouth 3 (three) times daily. 270 capsule 1   loratadine (CLARITIN) 10 MG tablet Take 1 tablet (10 mg total) by mouth daily as needed (cough, allergies). 30 tablet 2   meloxicam (MOBIC) 15 MG tablet Take 1 tablet (15 mg total) by mouth daily. 90 tablet 0   metFORMIN (GLUCOPHAGE-XR) 750 MG 24 hr tablet Take 1 tablet (750 mg total) by mouth daily with breakfast. 90 tablet 1   rosuvastatin (CRESTOR) 10 MG tablet Take 1 tablet (10 mg total) by mouth daily. 90 tablet 1   valsartan-hydrochlorothiazide (DIOVAN-HCT) 320-12.5 MG tablet Take 1 tablet by mouth daily. New dose 90 tablet 1   Vitamin D, Ergocalciferol, (DRISDOL) 1.25 MG (50000 UNIT) CAPS capsule Take 1 capsule (50,000 Units total) by mouth every 7 (seven) days. 12 capsule 1   zolpidem (AMBIEN) 10 MG tablet Take 1 tablet (10 mg total) by mouth at bedtime. 90 tablet 1   predniSONE (DELTASONE) 10 MG tablet Take 1 tablet (10 mg total) by mouth 2 (two) times daily with a meal. (Patient not taking: Reported on 11/01/2023) 10 tablet 0   No current facility-administered medications for this visit.    OBJECTIVE: Vitals:   11/01/23 1000  BP: (!) 145/85  Pulse: 92  Resp: 18  Temp: 99 F (37.2 C)  SpO2: 97%     Body mass index is 35.53 kg/m.    ECOG FS:0 - Asymptomatic  General: Well-developed, well-nourished, no acute distress. Eyes: Pink conjunctiva, anicteric sclera. HEENT: Normocephalic, moist mucous  membranes. Breast: Exam deferred today. Lungs: No audible wheezing or coughing. Heart: Regular rate and rhythm. Abdomen: Soft, nontender, no obvious distention. Musculoskeletal: No edema, cyanosis, or clubbing. Neuro: Alert, answering all questions appropriately. Cranial nerves grossly intact. Skin: No rashes or petechiae noted. Psych: Normal affect.  LAB RESULTS:  Lab Results  Component Value Date   NA 140 11/09/2022   K 4.4 11/09/2022   CL 105 11/09/2022   CO2 27 11/09/2022   GLUCOSE 90 11/09/2022   BUN 14 11/09/2022   CREATININE 0.82 11/09/2022   CALCIUM 10.1 11/09/2022   PROT 7.1 11/09/2022   ALBUMIN 4.2 03/19/2016   AST 17 11/09/2022   ALT 18 11/09/2022   ALKPHOS 111 03/19/2016   BILITOT 0.3 11/09/2022   GFRNONAA 87 12/14/2020   GFRAA 101 12/14/2020    Lab Results  Component Value  Date   WBC 9.4 11/09/2022   NEUTROABS 5,245 11/09/2022   HGB 13.4 11/09/2022   HCT 41.2 11/09/2022   MCV 82.4 11/09/2022   PLT 331 11/09/2022     STUDIES: No results found.   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  Patient completed 5 years of anastrozole in September 2018.  Her most recent mammogram on March 17, 2020 was reported as BI-RADS 2.  Her most recent mammogram on April 16, 2023 was reported as BI-RADS 4, but subsequent biopsy on April 30, 2023 did not reveal any evidence of malignancy.  No intervention is needed at this time.  Continue yearly mammograms in September 2025.  No further follow-up has been scheduled.   Osteopenia: Patient's most recent bone mineral density on April 04, 2023 revealed a T-score of -1.7 which is unchanged from previous.  Recommended continuing calcium and vitamin D supplementation.  Peripheral neuropathy:  Patient does not complain of this today.  I spent a total of 20 minutes reviewing chart data, face-to-face evaluation with the patient, counseling and coordination of care as detailed above.   Patient expressed understanding and was in agreement with this plan. She also understands that She can call clinic at any time with any questions, concerns, or complaints.    Jeralyn Ruths, MD   11/01/2023 10:15 AM

## 2023-12-09 ENCOUNTER — Encounter: Payer: Self-pay | Admitting: Family Medicine

## 2023-12-09 ENCOUNTER — Ambulatory Visit: Payer: Self-pay | Admitting: Family Medicine

## 2023-12-09 VITALS — BP 132/84 | HR 85 | Temp 98.5°F | Resp 16 | Ht 62.75 in | Wt 208.6 lb

## 2023-12-09 DIAGNOSIS — Z1231 Encounter for screening mammogram for malignant neoplasm of breast: Secondary | ICD-10-CM

## 2023-12-09 DIAGNOSIS — Z23 Encounter for immunization: Secondary | ICD-10-CM

## 2023-12-09 DIAGNOSIS — Z0000 Encounter for general adult medical examination without abnormal findings: Secondary | ICD-10-CM

## 2023-12-09 DIAGNOSIS — E559 Vitamin D deficiency, unspecified: Secondary | ICD-10-CM | POA: Diagnosis not present

## 2023-12-09 DIAGNOSIS — Z0001 Encounter for general adult medical examination with abnormal findings: Secondary | ICD-10-CM

## 2023-12-09 DIAGNOSIS — I152 Hypertension secondary to endocrine disorders: Secondary | ICD-10-CM

## 2023-12-09 DIAGNOSIS — E538 Deficiency of other specified B group vitamins: Secondary | ICD-10-CM

## 2023-12-09 DIAGNOSIS — E1159 Type 2 diabetes mellitus with other circulatory complications: Secondary | ICD-10-CM | POA: Diagnosis not present

## 2023-12-09 NOTE — Progress Notes (Signed)
 Name: Brenda Jones   MRN: [id number]    DOB: 1965/11/21   Date:12/09/2023       Progress Note  Subjective  Chief Complaint  Chief Complaint  Patient presents with   Annual Exam    HPI  Patient presents for annual CPE.  Diet: eats a lot of pre packed meals. Discussed healthier options  Exercise: works from 3 am to 3 pm, discussed walking during lunch break  Last Eye Exam: she will schedule  Last Dental Exam: she will schedule it   Flowsheet Row Office Visit from 12/09/2023 in [facility]  AUDIT-C Score 0      Depression: Phq 9 is  negative    12/09/2023    7:57 AM 09/30/2023    3:36 PM 06/12/2023    9:08 AM 02/12/2023    8:09 AM 12/04/2022    7:54 AM  Depression screen PHQ 2/9  Decreased Interest 0 0 0 0 0  Down, Depressed, Hopeless 0 0 0 0 0  PHQ - 2 Score 0 0 0 0 0  Altered sleeping   0 0 0  Tired, decreased energy   0 0 0  Change in appetite   0 0 0  Feeling bad or failure about yourself    0 0 0  Trouble concentrating   0 0 0  Moving slowly or fidgety/restless   0 0 0  Suicidal thoughts   0 0 0  PHQ-9 Score   0 0 0   Hypertension: BP Readings from Last 3 Encounters:  12/09/23 (!) 144/88  11/01/23 (!) 145/85  10/21/23 134/84   Obesity: Wt Readings from Last 3 Encounters:  12/09/23 208 lb 9.6 oz (94.6 kg)  11/01/23 207 lb (93.9 kg)  10/21/23 209 lb 12.8 oz (95.2 kg)   BMI Readings from Last 3 Encounters:  12/09/23 37.25 kg/m  11/01/23 35.53 kg/m  10/21/23 36.01 kg/m     Vaccines: reviewed with the patient. She will get PCV 20 today   Hep C Screening: completed STD testing and prevention (HIV/chl/gon/syphilis): N/A Intimate partner violence: negative screen  Sexual History : not sexually active since she had breast cancer 13 years ago  Menstrual History/LMP/Abnormal Bleeding: post menopausal  Discussed importance of follow up if any post-menopausal bleeding: yes  Incontinence Symptoms: negative for symptoms   Breast  cancer:  - Last Mammogram: up to date - BRCA gene screening: she had breast cancer negative genetic testing   Osteoporosis Prevention : Discussed high calcium  and vitamin D  supplementation, weight bearing exercises Bone density :yes - osteopenia  Cervical cancer screening: up-to-date sees gyn - atypical glandular cells  Skin cancer: Discussed monitoring for atypical lesions  Colorectal cancer: up to date    Lung cancer:  Low Dose CT Chest recommended if Age 71-80 years, 20 pack-year currently smoking OR have quit w/in 15years. Patient declines  ECG: 2013  Advanced Care Planning: A voluntary discussion about advance care planning including the explanation and discussion of advance directives.  Discussed health care proxy and Living will, and the patient was able to identify a health care proxy as son - Tre.  Patient does not have a living will and power of attorney of health care   Patient Active Problem List   Diagnosis Date Noted   Hypertension associated with type 2 diabetes mellitus (HCC) 06/12/2023   Vitamin D  deficiency 05/10/2022   History of colonic polyps    Polyp of transverse colon    Polyp of sigmoid colon  History of breast cancer 06/24/2019   Osteoarthritis of right knee 08/29/2016   Osteopenia due to cancer therapy 04/18/2015   GERD (gastroesophageal reflux disease) 04/18/2015   Muscle spasm 04/18/2015   Benign neoplasm of descending colon    Cervical high risk HPV (human papillomavirus) test positive 03/22/2015   Benign essential HTN 01/08/2015   Insomnia, persistent 01/08/2015   COPD, mild (HCC) 01/08/2015   Dyslipidemia 01/08/2015   Dysmetabolic syndrome 01/08/2015   Morbid obesity (HCC) 01/08/2015   Seasonal allergic rhinitis 01/08/2015   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   Arthralgia of multiple joints 01/08/2015    Past Surgical History:  Procedure Laterality Date   BLADDER SURGERY  2007   BREAST BIOPSY Left 2013   invasive  mammary carcinoma   BREAST BIOPSY Left 04/30/2023   stereo bx, LEFT breast asymmetry, X clip-path pending   BREAST BIOPSY Left 04/30/2023   MM LT BREAST BX W LOC DEV 1ST LESION IMAGE BX SPEC STEREO GUIDE 04/30/2023 [facility]   BREAST LUMPECTOMY Left 2013   invasive mammary carcinoma with 2 positive lymph nodes. Margins were clear.    BREAST SURGERY     COLONOSCOPY WITH PROPOFOL  N/A 04/04/2015   Procedure: COLONOSCOPY WITH PROPOFOL ;  Surgeon: Marnee Sink, MD;  Location: [facility];  Service: Endoscopy;  Laterality: N/A;   COLONOSCOPY WITH PROPOFOL  N/A 07/19/2020   Procedure: COLONOSCOPY WITH PROPOFOL ;  Surgeon: Marnee Sink, MD;  Location: [facility];  Service: Endoscopy;  Laterality: N/A;   POLYPECTOMY  04/04/2015   Procedure: POLYPECTOMY;  Surgeon: Marnee Sink, MD;  Location: [facility];  Service: Endoscopy;;    Family History  Problem Relation Age of Onset   Heart disease Mother    Diabetes Father    Hypertension Father    Breast cancer Neg Hx     Social History   Socioeconomic History   Marital status: Single    Spouse name: Not on file   Number of children: 1   Years of education: Not on file   Highest education level: 12th grade  Occupational History   Occupation: Media planner.   Tobacco Use   Smoking status: Every Day    Current packs/day: 0.75    Average packs/day: 0.8 packs/day for 42.4 years (31.8 ttl pk-yrs)    Types: Cigarettes    Start date: 06/27/1981   Smokeless tobacco: Never  Vaping Use   Vaping status: Never Used  Substance and Sexual Activity   Alcohol use: No    Alcohol/week: 0.0 standard drinks of alcohol   Drug use: No   Sexual activity: Not Currently    Partners: Male  Other Topics Concern   Not on file  Social History Narrative   ** Merged History Encounter **       She has one son, still lives at home, he helps her out financially also.    Social Drivers of Corporate investment banker Strain: Low Risk   (12/09/2023)   Overall Financial Resource Strain (CARDIA)    Difficulty of Paying Living Expenses: Not hard at all  Food Insecurity: No Food Insecurity (12/09/2023)   Hunger Vital Sign    Worried About Running Out of Food in the Last Year: Never true    Ran Out of Food in the Last Year: Never true  Transportation Needs: No Transportation Needs (12/09/2023)   PRAPARE - Administrator, Civil Service (Medical): No    Lack of Transportation (Non-Medical): No  Physical Activity: Inactive (  12/09/2023)   Exercise Vital Sign    Days of Exercise per Week: 0 days    Minutes of Exercise per Session: 0 min  Stress: No Stress Concern Present (12/09/2023)   [employer] of Occupational Health - Occupational Stress Questionnaire    Feeling of Stress : Not at all  Social Connections: Moderately Isolated (12/09/2023)   Social Connection and Isolation Panel [NHANES]    Frequency of Communication with Friends and Family: More than three times a week    Frequency of Social Gatherings with Friends and Family: More than three times a week    Attends Religious Services: More than 4 times per year    Active Member of [employer] or Organizations: No    Attends Banker Meetings: Never    Marital Status: Never married  Intimate Partner Violence: Not At Risk (12/09/2023)   Humiliation, Afraid, Rape, and Kick questionnaire    Fear of Current or Ex-Partner: No    Emotionally Abused: No    Physically Abused: No    Sexually Abused: No     Current Outpatient Medications:    Cyanocobalamin  (VITAMIN B-12) 1000 MCG SUBL, Place 1 tablet (1,000 mcg total) under the tongue 3 (three) times a week., Disp: 100 tablet, Rfl: 1   cyclobenzaprine  (FLEXERIL ) 10 MG tablet, Take 1 tablet (10 mg total) by mouth at bedtime., Disp: 90 tablet, Rfl: 1   fluticasone  (FLONASE ) 50 MCG/ACT nasal spray, PLACE 2 SPRAYS INTO BOTH NOSTRILS AS NEEDED., Disp: 48 mL, Rfl: 1   Fluticasone -Umeclidin-Vilant (TRELEGY ELLIPTA )  100-62.5-25 MCG/ACT AEPB, Inhale 1 puff into the lungs daily., Disp: 60 each, Rfl: 5   gabapentin  (NEURONTIN ) 300 MG capsule, Take 1 capsule (300 mg total) by mouth 3 (three) times daily., Disp: 270 capsule, Rfl: 1   loratadine  (CLARITIN ) 10 MG tablet, Take 1 tablet (10 mg total) by mouth daily as needed (cough, allergies)., Disp: 30 tablet, Rfl: 2   meloxicam  (MOBIC ) 15 MG tablet, Take 1 tablet (15 mg total) by mouth daily., Disp: 90 tablet, Rfl: 0   metFORMIN  (GLUCOPHAGE -XR) 750 MG 24 hr tablet, Take 1 tablet (750 mg total) by mouth daily with breakfast., Disp: 90 tablet, Rfl: 1   rosuvastatin  (CRESTOR ) 10 MG tablet, Take 1 tablet (10 mg total) by mouth daily., Disp: 90 tablet, Rfl: 1   valsartan -hydrochlorothiazide  (DIOVAN -HCT) 320-12.5 MG tablet, Take 1 tablet by mouth daily. New dose, Disp: 90 tablet, Rfl: 1   Vitamin D , Ergocalciferol , (DRISDOL ) 1.25 MG (50000 UNIT) CAPS capsule, Take 1 capsule (50,000 Units total) by mouth every 7 (seven) days., Disp: 12 capsule, Rfl: 1   zolpidem  (AMBIEN ) 10 MG tablet, Take 1 tablet (10 mg total) by mouth at bedtime., Disp: 90 tablet, Rfl: 1   predniSONE  (DELTASONE ) 10 MG tablet, Take 1 tablet (10 mg total) by mouth 2 (two) times daily with a meal. (Patient not taking: Reported on 11/01/2023), Disp: 10 tablet, Rfl: 0  Allergies  Allergen Reactions   Ace Inhibitors Cough     ROS  Constitutional: Negative for fever or weight change.  Respiratory: positive for intermittent cough and shortness of breath with active .   Cardiovascular: Negative for chest pain or palpitations.  Gastrointestinal: Negative for abdominal pain, no bowel changes.  Musculoskeletal: Negative for gait problem or joint swelling.  Skin: Negative for rash.  Neurological: Negative for dizziness or headache.  No other specific complaints in a complete review of systems (except as listed in HPI above).   Objective  Vitals:  12/09/23 0800  BP: (!) 144/88  Pulse: 85  Resp: 16   Temp: 98.5 F (36.9 C)  TempSrc: Oral  SpO2: 98%  Weight: 208 lb 9.6 oz (94.6 kg)  Height: 5' 2.75" (1.594 m)    Body mass index is 37.25 kg/m.  Physical Exam  Constitutional: Patient appears well-developed and well-nourished. No distress.  HENT: Head: Normocephalic and atraumatic. Ears: B TMs ok, no erythema or effusion; Nose: Nose normal. Mouth/Throat: Oropharynx is clear and moist. No oropharyngeal exudate.  Eyes: Conjunctivae and EOM are normal. Pupils are equal, round, and reactive to light. No scleral icterus.  Neck: Normal range of motion. Neck supple. No JVD present. No thyromegaly present.  Cardiovascular: Normal rate, regular rhythm and normal heart sounds.  No murmur heard. No BLE edema. Pulmonary/Chest: Effort normal and breath sounds normal. No respiratory distress. Abdominal: Soft. Bowel sounds are normal, no distension. There is no tenderness. no masses Breast: no lumps or masses, no nipple discharge or rashes FEMALE GENITALIA:  Not done  RECTAL: not done  Musculoskeletal: Normal range of motion, no joint effusions. No gross deformities Neurological: he is alert and oriented to person, place, and time. No cranial nerve deficit. Coordination, balance, strength, speech and gait are normal.  Skin: Skin is warm and dry. No rash noted. No erythema.  Psychiatric: Patient has a normal mood and affect. behavior is normal. Judgment and thought content normal.     Assessment & Plan   1. Well adult exam (Primary)  - Pneumococcal conjugate vaccine 20-valent (Prevnar 20) - MM 3D DIAGNOSTIC MAMMOGRAM UNILATERAL LEFT BREAST; Future - Lipid panel - Microalbumin / creatinine urine ratio - CBC with Differential/Platelet - Comprehensive metabolic panel with GFR - VITAMIN D  25 Hydroxy (Vit-D Deficiency, Fractures) - B12 and Folate Panel  2. Encounter for screening mammogram for malignant neoplasm of breast  - MM 3D DIAGNOSTIC MAMMOGRAM UNILATERAL LEFT BREAST; Future  3.  Immunization due  - Pneumococcal conjugate vaccine 20-valent (Prevnar 20)  4. Hypertension associated with type 2 diabetes mellitus (HCC)  - Lipid panel - Microalbumin / creatinine urine ratio - CBC with Differential/Platelet - Comprehensive metabolic panel with GFR  5. Low serum vitamin B12  - B12 and Folate Panel  6. Vitamin D  deficiency  - VITAMIN D  25 Hydroxy (Vit-D Deficiency, Fractures)    -USPSTF grade A and B recommendations reviewed with patient; age-appropriate recommendations, preventive care, screening tests, etc discussed and encouraged; healthy living encouraged; see AVS for patient education given to patient -Discussed importance of 150 minutes of physical activity weekly, eat two servings of fish weekly, eat one serving of tree nuts ( cashews, pistachios, pecans, almonds.Aaron Aas) every other day, eat 6 servings of fruit/vegetables daily and drink plenty of water  and avoid sweet beverages.   -Reviewed Health Maintenance: Yes.

## 2023-12-09 NOTE — Patient Instructions (Signed)
 Preventive Care 16-59 Years Old, Female  Preventive care refers to lifestyle choices and visits with your health care provider that can promote health and wellness. Preventive care visits are also called wellness exams.  What can I expect for my preventive care visit?  Counseling  Your health care provider may ask you questions about your:  Medical history, including:  Past medical problems.  Family medical history.  Pregnancy history.  Current health, including:  Menstrual cycle.  Method of birth control.  Emotional well-being.  Home life and relationship well-being.  Sexual activity and sexual health.  Lifestyle, including:  Alcohol, nicotine or tobacco, and drug use.  Access to firearms.  Diet, exercise, and sleep habits.  Work and work Astronomer.  Sunscreen use.  Safety issues such as seatbelt and bike helmet use.  Physical exam  Your health care provider will check your:  Height and weight. These may be used to calculate your BMI (body mass index). BMI is a measurement that tells if you are at a healthy weight.  Waist circumference. This measures the distance around your waistline. This measurement also tells if you are at a healthy weight and may help predict your risk of certain diseases, such as type 2 diabetes and high blood pressure.  Heart rate and blood pressure.  Body temperature.  Skin for abnormal spots.  What immunizations do I need?    Vaccines are usually given at various ages, according to a schedule. Your health care provider will recommend vaccines for you based on your age, medical history, and lifestyle or other factors, such as travel or where you work.  What tests do I need?  Screening  Your health care provider may recommend screening tests for certain conditions. This may include:  Lipid and cholesterol levels.  Diabetes screening. This is done by checking your blood sugar (glucose) after you have not eaten for a while (fasting).  Pelvic exam and Pap test.  Hepatitis B test.  Hepatitis C  test.  HIV (human immunodeficiency virus) test.  STI (sexually transmitted infection) testing, if you are at risk.  Lung cancer screening.  Colorectal cancer screening.  Mammogram. Talk with your health care provider about when you should start having regular mammograms. This may depend on whether you have a family history of breast cancer.  BRCA-related cancer screening. This may be done if you have a family history of breast, ovarian, tubal, or peritoneal cancers.  Bone density scan. This is done to screen for osteoporosis.  Talk with your health care provider about your test results, treatment options, and if necessary, the need for more tests.  Follow these instructions at home:  Eating and drinking    Eat a diet that includes fresh fruits and vegetables, whole grains, lean protein, and low-fat dairy products.  Take vitamin and mineral supplements as recommended by your health care provider.  Do not drink alcohol if:  Your health care provider tells you not to drink.  You are pregnant, may be pregnant, or are planning to become pregnant.  If you drink alcohol:  Limit how much you have to 0-1 drink a day.  Know how much alcohol is in your drink. In the [place] one drink equals one 12 oz bottle of beer (355 mL), one 5 oz glass of wine (148 mL), or one 1 oz glass of hard liquor (44 mL).  Lifestyle  Brush your teeth every morning and night with fluoride toothpaste. Floss one time each day.  Exercise for at least  30 minutes 5 or more days each week.  Do not use any products that contain nicotine or tobacco. These products include cigarettes, chewing tobacco, and vaping devices, such as e-cigarettes. If you need help quitting, ask your health care provider.  Do not use drugs.  If you are sexually active, practice safe sex. Use a condom or other form of protection to prevent STIs.  If you do not wish to become pregnant, use a form of birth control. If you plan to become pregnant, see your health care provider for a  prepregnancy visit.  Take aspirin only as told by your health care provider. Make sure that you understand how much to take and what form to take. Work with your health care provider to find out whether it is safe and beneficial for you to take aspirin daily.  Find healthy ways to manage stress, such as:  Meditation, yoga, or listening to music.  Journaling.  Talking to a trusted person.  Spending time with friends and family.  Minimize exposure to UV radiation to reduce your risk of skin cancer.  Safety  Always wear your seat belt while driving or riding in a vehicle.  Do not drive:  If you have been drinking alcohol. Do not ride with someone who has been drinking.  When you are tired or distracted.  While texting.  If you have been using any mind-altering substances or drugs.  Wear a helmet and other protective equipment during sports activities.  If you have firearms in your house, make sure you follow all gun safety procedures.  Seek help if you have been physically or sexually abused.  What's next?  Visit your health care provider once a year for an annual wellness visit.  Ask your health care provider how often you should have your eyes and teeth checked.  Stay up to date on all vaccines.  This information is not intended to replace advice given to you by your health care provider. Make sure you discuss any questions you have with your health care provider.  Document Revised: 01/04/2021 Document Reviewed: 01/04/2021  Elsevier Patient Education  2024 [employer].

## 2023-12-10 ENCOUNTER — Other Ambulatory Visit: Payer: Self-pay | Admitting: Family Medicine

## 2023-12-10 ENCOUNTER — Ambulatory Visit: Payer: Self-pay | Admitting: Family Medicine

## 2023-12-10 DIAGNOSIS — Z0000 Encounter for general adult medical examination without abnormal findings: Secondary | ICD-10-CM

## 2023-12-10 DIAGNOSIS — Z1231 Encounter for screening mammogram for malignant neoplasm of breast: Secondary | ICD-10-CM

## 2023-12-10 LAB — COMPREHENSIVE METABOLIC PANEL WITH GFR
AG Ratio: 1.6 (calc) (ref 1.0–2.5)
ALT: 13 U/L (ref 6–29)
AST: 14 U/L (ref 10–35)
Albumin: 4.4 g/dL (ref 3.6–5.1)
Alkaline phosphatase (APISO): 93 U/L (ref 37–153)
BUN: 9 mg/dL (ref 7–25)
CO2: 28 mmol/L (ref 20–32)
Calcium: 10.2 mg/dL (ref 8.6–10.4)
Chloride: 104 mmol/L (ref 98–110)
Creat: 0.77 mg/dL (ref 0.50–1.03)
Globulin: 2.8 g/dL (ref 1.9–3.7)
Glucose, Bld: 87 mg/dL (ref 65–99)
Potassium: 4.4 mmol/L (ref 3.5–5.3)
Sodium: 139 mmol/L (ref 135–146)
Total Bilirubin: 0.3 mg/dL (ref 0.2–1.2)
Total Protein: 7.2 g/dL (ref 6.1–8.1)
eGFR: 89 mL/min/{1.73_m2} (ref >=60)

## 2023-12-10 LAB — CBC WITH DIFFERENTIAL/PLATELET
Absolute Lymphocytes: 3563 {cells}/uL (ref 850–3900)
Absolute Monocytes: 470 {cells}/uL (ref 200–950)
Basophils Absolute: 47 {cells}/uL (ref 0–200)
Basophils Relative: 0.5 %
Eosinophils Absolute: 169 {cells}/uL (ref 15–500)
Eosinophils Relative: 1.8 %
HCT: 44 % (ref 35.0–45.0)
Hemoglobin: 13.5 g/dL (ref 11.7–15.5)
MCH: 26.7 pg — ABNORMAL LOW (ref 27.0–33.0)
MCHC: 30.7 g/dL — ABNORMAL LOW (ref 32.0–36.0)
MCV: 87.1 fL (ref 80.0–100.0)
MPV: 11.5 fL (ref 7.5–12.5)
Monocytes Relative: 5 %
Neutro Abs: 5151 {cells}/uL (ref 1500–7800)
Neutrophils Relative %: 54.8 %
Platelets: 287 10*3/uL (ref 140–400)
RBC: 5.05 10*6/uL (ref 3.80–5.10)
RDW: 14.9 % (ref 11.0–15.0)
Total Lymphocyte: 37.9 %
WBC: 9.4 10*3/uL (ref 3.8–10.8)

## 2023-12-10 LAB — B12 AND FOLATE PANEL
Folate: 5.1 ng/mL — ABNORMAL LOW
Vitamin B-12: 336 pg/mL (ref 200–1100)

## 2023-12-10 LAB — LIPID PANEL
Cholesterol: 120 mg/dL (ref <200)
HDL: 52 mg/dL (ref >=50)
LDL Cholesterol (Calc): 53 mg/dL
Non-HDL Cholesterol (Calc): 68 mg/dL (ref <130)
Total CHOL/HDL Ratio: 2.3 (calc) (ref <5.0)
Triglycerides: 73 mg/dL (ref <150)

## 2023-12-10 LAB — VITAMIN D 25 HYDROXY (VIT D DEFICIENCY, FRACTURES): Vit D, 25-Hydroxy: 84 ng/mL (ref 30–100)

## 2023-12-10 LAB — MICROALBUMIN / CREATININE URINE RATIO
Creatinine, Urine: 31 mg/dL (ref 20–275)
Microalb Creat Ratio: 13 mg/g{creat} (ref <30)
Microalb, Ur: 0.4 mg/dL

## 2024-01-22 ENCOUNTER — Other Ambulatory Visit: Payer: Self-pay | Admitting: Family Medicine

## 2024-01-22 DIAGNOSIS — E559 Vitamin D deficiency, unspecified: Secondary | ICD-10-CM

## 2024-01-26 ENCOUNTER — Other Ambulatory Visit: Payer: Self-pay | Admitting: Family Medicine

## 2024-01-26 DIAGNOSIS — E785 Hyperlipidemia, unspecified: Secondary | ICD-10-CM

## 2024-01-27 ENCOUNTER — Other Ambulatory Visit: Payer: Self-pay | Admitting: Family Medicine

## 2024-01-27 DIAGNOSIS — I152 Hypertension secondary to endocrine disorders: Secondary | ICD-10-CM

## 2024-02-04 ENCOUNTER — Encounter: Payer: Self-pay | Admitting: Family Medicine

## 2024-02-04 ENCOUNTER — Ambulatory Visit: Admitting: Family Medicine

## 2024-02-04 VITALS — BP 136/74 | HR 99 | Temp 97.9°F | Resp 16 | Ht 62.75 in | Wt 200.1 lb

## 2024-02-04 DIAGNOSIS — J441 Chronic obstructive pulmonary disease with (acute) exacerbation: Secondary | ICD-10-CM

## 2024-02-04 DIAGNOSIS — I152 Hypertension secondary to endocrine disorders: Secondary | ICD-10-CM | POA: Diagnosis not present

## 2024-02-04 DIAGNOSIS — E1159 Type 2 diabetes mellitus with other circulatory complications: Secondary | ICD-10-CM | POA: Diagnosis not present

## 2024-02-04 MED ORDER — PREDNISONE 10 MG PO TABS: 10.0000 mg | ORAL_TABLET | Freq: Two times a day (BID) | ORAL | 0 refills | Status: DC

## 2024-02-04 MED ORDER — DOXYCYCLINE HYCLATE 100 MG PO TABS: 100.0000 mg | ORAL_TABLET | Freq: Two times a day (BID) | ORAL | 0 refills | Status: DC

## 2024-02-04 MED ORDER — BENZONATATE 100 MG PO CAPS: 100.0000 mg | ORAL_CAPSULE | Freq: Four times a day (QID) | ORAL | 0 refills | Status: DC

## 2024-02-04 NOTE — Progress Notes (Signed)
 Name: Brenda Jones   MRN: [id number]    DOB: [birth year]   Date:02/04/2024       Progress Note  Subjective  Chief Complaint  Chief Complaint  Patient presents with   Cough    X1 week, no other sx   Nasal Congestion   Discussed the use of AI scribe software for clinical note transcription with the patient, who gave verbal consent to proceed.  History of Present Illness Brenda Jones is a 58 year old female with COPD who presents with a persistent cough and shortness of breath.  She became ill last Wednesday, initially experiencing a severe dry cough. She attempted to manage it with acosecaplus, which reduced the cough but caused nausea. By Monday, the cough became productive with significant mucus production. No fever was noted, but she felt cold and experienced chills. Her appetite decreased, but she maintained hydration by drinking water .  She has a history of COPD and during this episode, she experienced wheezing and shortness of breath. She uses Trelegy for COPD management. Despite this, wheezing persists, and she feels fatigued. No history of pneumonia is reported.  She experiences intermittent headaches, with a severity of 6 to 7 out of 10, exacerbated by coughing. Poor sleep is noted due to her symptoms.  She does not smoke. She also manages diabetes alongside her COPD.    Patient Active Problem List   Diagnosis Date Noted   Hypertension associated with type 2 diabetes mellitus (HCC) 06/12/2023   Vitamin D  deficiency 05/10/2022   History of colonic polyps    Polyp of transverse colon    Polyp of sigmoid colon    History of breast cancer 06/24/2019   Osteoarthritis of right knee 08/29/2016   Osteopenia due to cancer therapy 04/18/2015   GERD (gastroesophageal reflux disease) 04/18/2015   Muscle spasm 04/18/2015   Benign neoplasm of descending colon    Cervical high risk HPV (human papillomavirus) test positive 03/22/2015   Benign essential HTN 01/08/2015   Insomnia,  persistent 01/08/2015   COPD, mild (HCC) 01/08/2015   Dyslipidemia 01/08/2015   Dysmetabolic syndrome 01/08/2015   Morbid obesity (HCC) 01/08/2015   Seasonal allergic rhinitis 01/08/2015   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 Margins were clear.    BREAST SURGERY     COLONOSCOPY WITH PROPOFOL  N/A 04/04/2015   Procedure: COLONOSCOPY WITH PROPOFOL ;  Surgeon: Rogelia Copping, MD;  Location: [facility];  Service: Endoscopy;  Laterality: N/A;   COLONOSCOPY WITH PROPOFOL  N/A 07/19/2020   Procedure: COLONOSCOPY WITH PROPOFOL ;  Surgeon: Copping Rogelia, MD;  Location: [facility];  Service: Endoscopy;  Laterality: N/A;   POLYPECTOMY  04/04/2015   Procedure: POLYPECTOMY;  Surgeon: Rogelia Copping, MD;  Location: [facility];  Service: Endoscopy;;    Family History  Problem Relation Age of Onset   Heart disease Mother    Diabetes Father    Hypertension Father    Breast cancer Neg Hx     Social History  Tobacco Use   Smoking status: Every Day    Current packs/day: 0.75    Average packs/day: 0.8 packs/day for 42.6 years (32.0 ttl pk-yrs)    Types: Cigarettes    Start date: 06/27/1981   Smokeless tobacco: Never  Substance Use Topics   Alcohol use: No    Alcohol/week: 0.0 standard drinks of alcohol     Current Outpatient Medications:    benzonatate  (TESSALON ) 100 MG capsule, Take 1-2 capsules (100-200 mg total) by mouth 4 (four) times daily., Disp: 40 capsule, Rfl: 0    Cyanocobalamin  (VITAMIN B-12) 1000 MCG SUBL, Place 1 tablet (1,000 mcg total) under the tongue 3 (three) times a week., Disp: 100 tablet, Rfl: 1   cyclobenzaprine  (FLEXERIL ) 10 MG tablet, Take 1 tablet (10 mg total) by mouth at bedtime., Disp: 90 tablet, Rfl: 1   doxycycline  (VIBRA -TABS) 100 MG tablet, Take 1 tablet (100 mg total) by mouth 2 (two) times daily., Disp: 14 tablet, Rfl: 0   fluticasone  (FLONASE ) 50 MCG/ACT nasal spray, PLACE 2 SPRAYS INTO BOTH NOSTRILS AS NEEDED., Disp: 48 mL, Rfl: 1   Fluticasone -Umeclidin-Vilant (TRELEGY ELLIPTA ) 100-62.5-25 MCG/ACT AEPB, Inhale 1 puff into the lungs daily., Disp: 60 each, Rfl: 5   gabapentin  (NEURONTIN ) 300 MG capsule, Take 1 capsule (300 mg total) by mouth 3 (three) times daily., Disp: 270 capsule, Rfl: 1   loratadine  (CLARITIN ) 10 MG tablet, Take 1 tablet (10 mg total) by mouth daily as needed (cough, allergies)., Disp: 30 tablet, Rfl: 2   meloxicam  (MOBIC ) 15 MG tablet, Take 1 tablet (15 mg total) by mouth daily., Disp: 90 tablet, Rfl: 0   metFORMIN  (GLUCOPHAGE -XR) 750 MG 24 hr tablet, TAKE 1 TABLET BY MOUTH EVERY DAY WITH BREAKFAST, Disp: 30 tablet, Rfl: 0   predniSONE  (DELTASONE ) 10 MG tablet, Take 1 tablet (10 mg total) by mouth 2 (two) times daily with a meal., Disp: 10 tablet, Rfl: 0   rosuvastatin  (CRESTOR ) 10 MG tablet, TAKE 1 TABLET BY MOUTH EVERY DAY, Disp: 30 tablet, Rfl: 0   valsartan -hydrochlorothiazide  (DIOVAN -HCT) 320-12.5 MG tablet, Take 1 tablet by mouth daily. New dose, Disp: 90 tablet, Rfl: 1   Vitamin D , Ergocalciferol , (DRISDOL ) 1.25 MG (50000 UNIT) CAPS capsule, Take 1 capsule (50,000 Units total) by mouth every 7 (seven) days., Disp: 12 capsule, Rfl: 1   zolpidem  (AMBIEN ) 10 MG tablet, Take 1 tablet (10 mg total) by mouth at bedtime., Disp: 90 tablet, Rfl: 1  Allergies  Allergen Reactions   Ace Inhibitors Cough    I personally reviewed active problem list, medication list, allergies with the patient/caregiver  today.   ROS  Ten systems reviewed and is negative except as mentioned in HPI    Objective Physical Exam CONSTITUTIONAL: Patient appears well-developed and well-nourished. No distress. HEENT: Head atraumatic, normocephalic, neck supple. Throat and ears normal. CARDIOVASCULAR: Normal rate, regular rhythm and normal heart sounds. No murmur heard. No BLE edema. PULMONARY: Expiratory wheeze present, no crackles. Oxygen saturation normal. ABDOMINAL: There is no tenderness or distention. MUSCULOSKELETAL: Normal gait. Without gross motor or sensory deficit. PSYCHIATRIC: Patient has a normal mood and affect. Behavior is normal. Judgment and thought content normal.  Vitals:   02/04/24 1325  BP: 136/74  Pulse: 99  Resp: 16  Temp: 97.9 F (36.6 C)  TempSrc: Oral  SpO2: 98%  Weight: 200 lb 1.6 oz (90.8 kg)  Height: 5' 2.75 (1.594 m)    Body mass index is 35.73 kg/m.  Recent Results (from the past 2160 hours)  Lipid  panel     Status: None   Collection Time: 12/09/23  8:49 AM  Result Value Ref Range   Cholesterol 120 <200 mg/dL   HDL 52 > OR = 50 mg/dL   Triglycerides 73 <849 mg/dL   LDL Cholesterol (Calc) 53 mg/dL (calc)    Comment: Reference range: <100 . Desirable range <100 mg/dL for primary prevention;   <70 mg/dL for patients with CHD or diabetic patients  with > or = 2 CHD risk factors. SABRA LDL-C is now calculated using the Martin-Hopkins  calculation, which is a validated novel method providing  better accuracy than the Friedewald equation in the  estimation of LDL-C.  Gladis APPLETHWAITE et al. SANDREA. 7986;689(80): 2061-2068  (http://education.[facility].com/faq/FAQ164)    Total CHOL/HDL Ratio 2.3 <5.0 (calc)   Non-HDL Cholesterol (Calc) 68 <869 mg/dL (calc)    Comment: For patients with diabetes plus 1 major ASCVD risk  factor, treating to a non-HDL-C goal of <100 mg/dL  (LDL-C of <29 mg/dL) is considered a therapeutic  option.   Microalbumin / creatinine urine  ratio     Status: None   Collection Time: 12/09/23  8:49 AM  Result Value Ref Range   Creatinine, Urine 31 20 - 275 mg/dL   Microalb, Ur 0.4 mg/dL    Comment: Reference Range Not established    Microalb Creat Ratio 13 <30 mg/g creat    Comment: . The ADA defines abnormalities in albumin excretion as follows: SABRA Albuminuria Category        Result (mg/g creatinine) . Normal to Mildly increased   <30 Moderately increased         30-299  Severely increased           > OR = 300 . The ADA recommends that at least two of three specimens collected within a 3-6 month period be abnormal before considering a patient to be within a diagnostic category.   CBC with Differential/Platelet     Status: Abnormal   Collection Time: 12/09/23  8:49 AM  Result Value Ref Range   WBC 9.4 3.8 - 10.8 Thousand/uL   RBC 5.05 3.80 - 5.10 Million/uL   Hemoglobin 13.5 11.7 - 15.5 g/dL   HCT 55.9 64.9 - 54.9 %   MCV 87.1 80.0 - 100.0 fL   MCH 26.7 (L) 27.0 - 33.0 pg   MCHC 30.7 (L) 32.0 - 36.0 g/dL    Comment: For adults, a slight decrease in the calculated MCHC value (in the range of 30 to 32 g/dL) is most likely not clinically significant; however, it should be interpreted with caution in correlation with other red cell parameters and the patient's clinical condition.    RDW 14.9 11.0 - 15.0 %   Platelets 287 140 - 400 Thousand/uL   MPV 11.5 7.5 - 12.5 fL   Neutro Abs 5,151 1,500 - 7,800 cells/uL   Absolute Lymphocytes 3,563 850 - 3,900 cells/uL   Absolute Monocytes 470 200 - 950 cells/uL   Eosinophils Absolute 169 15 - 500 cells/uL   Basophils Absolute 47 0 - 200 cells/uL   Neutrophils Relative % 54.8 %   Total Lymphocyte 37.9 %   Monocytes Relative 5.0 %   Eosinophils Relative 1.8 %   Basophils Relative 0.5 %  Comprehensive metabolic panel with GFR     Status: None   Collection Time: 12/09/23  8:49 AM  Result Value Ref Range   Glucose, Bld 87 65 - 99 mg/dL    Comment: .  Fasting reference interval .    BUN 9 7 - 25 mg/dL   Creat 9.22 9.49 - 8.96 mg/dL   eGFR 89 > OR = 60 fO/fpw/8.26f7   BUN/Creatinine Ratio SEE NOTE: 6 - 22 (calc)    Comment:    Not Reported: BUN and Creatinine are within    reference range. .    Sodium 139 135 - 146 mmol/L   Potassium 4.4 3.5 - 5.3 mmol/L   Chloride 104 98 - 110 mmol/L   CO2 28 20 - 32 mmol/L   Calcium  10.2 8.6 - 10.4 mg/dL   Total Protein 7.2 6.1 - 8.1 g/dL   Albumin 4.4 3.6 - 5.1 g/dL   Globulin 2.8 1.9 - 3.7 g/dL (calc)   AG Ratio 1.6 1.0 - 2.5 (calc)   Total Bilirubin 0.3 0.2 - 1.2 mg/dL   Alkaline phosphatase (APISO) 93 37 - 153 U/L   AST 14 10 - 35 U/L   ALT 13 6 - 29 U/L  VITAMIN D  25 Hydroxy (Vit-D Deficiency, Fractures)     Status: None   Collection Time: 12/09/23  8:49 AM  Result Value Ref Range   Vit D, 25-Hydroxy 84 30 - 100 ng/mL    Comment: Vitamin D  Status         25-OH Vitamin D : . Deficiency:                    <20 ng/mL Insufficiency:             20 - 29 ng/mL Optimal:                 > or = 30 ng/mL . For 25-OH Vitamin D  testing on patients on  D2-supplementation and patients for whom quantitation  of D2 and D3 fractions is required, the [facility](TM) 25-OH VIT D, (D2,D3), LC/MS/MS is recommended: order  code 07111 (patients >41yrs). . See Note 1 . Note 1 . For additional information, please refer to  http://education.[facility].com/faq/FAQ199  (This link is being provided for informational/ educational purposes only.)   B12 and Folate Panel     Status: Abnormal   Collection Time: 12/09/23  8:49 AM  Result Value Ref Range   Vitamin B-12 336 200 - 1,100 pg/mL    Comment: . Please Note: Although the reference range for vitamin B12 is [id number] pg/mL, it has been reported that between 5 and 10% of patients with values between 200 and 400 pg/mL may experience neuropsychiatric and hematologic abnormalities due to occult B12 deficiency; less than 1% of patients with  values above 400 pg/mL will have symptoms. .    Folate 5.1 (L) ng/mL    Comment:                            Reference Range                            Low:           <3.4                            Borderline:    3.4-5.4                            Normal:        >5.4 .  Diabetic Foot Exam:     PHQ2/9:    02/04/2024    1:23 PM 12/09/2023    7:57 AM 09/30/2023    3:36 PM 06/12/2023    9:08 AM 02/12/2023    8:09 AM  Depression screen PHQ 2/9  Decreased Interest 0 0 0 0 0  Down, Depressed, Hopeless 0 0 0 0 0  PHQ - 2 Score 0 0 0 0 0  Altered sleeping    0 0  Tired, decreased energy    0 0  Change in appetite    0 0  Feeling bad or failure about yourself     0 0  Trouble concentrating    0 0  Moving slowly or fidgety/restless    0 0  Suicidal thoughts    0 0  PHQ-9 Score    0 0    phq 9 is negative  Fall Risk:    02/04/2024    1:22 PM 12/09/2023    7:52 AM 06/12/2023    9:08 AM 02/12/2023    8:09 AM 12/04/2022    7:53 AM  Fall Risk   Falls in the past year? 0 0 0 1 1  Number falls in past yr: 0 0 0 0 0  Injury with Fall? 0 0 0 1 1  Risk for fall due to : No Fall Risks No Fall Risks Impaired balance/gait History of fall(s) No Fall Risks  Follow up Falls evaluation completed Falls prevention discussed;Education provided;Falls evaluation completed Falls prevention discussed Falls prevention discussed;Education provided;Falls evaluation completed Falls prevention discussed      Assessment & Plan COPD exacerbation Exacerbation likely due to acute upper respiratory infection, presenting with wheezing and productive cough. Adequate oxygen saturation indicates no hypoxia. No pneumonia suspected. - Prescribe doxycycline  100 mg twice daily for 14 days. - Prescribe prednisone  for 5 days. - Prescribe Tessalon  Perles for cough. - Advise Mucinex for secretion clearance. - Advise smoking cessation. - Consider chest x-ray if no improvement.  Acute upper respiratory  infection Likely viral infection with concern for bacterial superinfection due to prolonged symptoms. - Prescribe doxycycline  100 mg twice daily for 14 days. - Consider further evaluation if no improvement.  Type 2 diabetes mellitus Risk of hyperglycemia due to prednisone  use. - Advise to avoid high carbohydrate intake while on prednisone .  Work and activity restrictions Advised rest and symptom monitoring before returning to work. - Advise return to work on Friday if symptoms improve after 48 hours of medication. - Instruct to contact office if symptoms do not improve for further evaluation and possible chest x-ray.

## 2024-02-11 ENCOUNTER — Encounter: Payer: Self-pay | Admitting: Family Medicine

## 2024-02-11 ENCOUNTER — Ambulatory Visit: Admitting: Family Medicine

## 2024-02-11 VITALS — BP 142/78 | HR 96 | Resp 16 | Ht 62.75 in | Wt 203.0 lb

## 2024-02-11 DIAGNOSIS — M8580 Other specified disorders of bone density and structure, unspecified site: Secondary | ICD-10-CM

## 2024-02-11 DIAGNOSIS — J441 Chronic obstructive pulmonary disease with (acute) exacerbation: Secondary | ICD-10-CM | POA: Diagnosis not present

## 2024-02-11 DIAGNOSIS — E1159 Type 2 diabetes mellitus with other circulatory complications: Secondary | ICD-10-CM | POA: Diagnosis not present

## 2024-02-11 DIAGNOSIS — E559 Vitamin D deficiency, unspecified: Secondary | ICD-10-CM

## 2024-02-11 DIAGNOSIS — G620 Drug-induced polyneuropathy: Secondary | ICD-10-CM | POA: Diagnosis not present

## 2024-02-11 DIAGNOSIS — I152 Hypertension secondary to endocrine disorders: Secondary | ICD-10-CM

## 2024-02-11 DIAGNOSIS — M791 Myalgia, unspecified site: Secondary | ICD-10-CM

## 2024-02-11 DIAGNOSIS — I10 Essential (primary) hypertension: Secondary | ICD-10-CM

## 2024-02-11 DIAGNOSIS — Z853 Personal history of malignant neoplasm of breast: Secondary | ICD-10-CM

## 2024-02-11 DIAGNOSIS — G4700 Insomnia, unspecified: Secondary | ICD-10-CM

## 2024-02-11 DIAGNOSIS — K219 Gastro-esophageal reflux disease without esophagitis: Secondary | ICD-10-CM

## 2024-02-11 DIAGNOSIS — E785 Hyperlipidemia, unspecified: Secondary | ICD-10-CM

## 2024-02-11 DIAGNOSIS — Z7984 Long term (current) use of oral hypoglycemic drugs: Secondary | ICD-10-CM

## 2024-02-11 LAB — POCT GLYCOSYLATED HEMOGLOBIN (HGB A1C): Hemoglobin A1C: 6.5 % — AB (ref 4.0–5.6)

## 2024-02-11 MED ORDER — GABAPENTIN 300 MG PO CAPS: 300.0000 mg | ORAL_CAPSULE | Freq: Three times a day (TID) | ORAL | 1 refills | Status: DC

## 2024-02-11 MED ORDER — PROMETHAZINE HCL 12.5 MG PO TABS: 12.5000 mg | ORAL_TABLET | Freq: Three times a day (TID) | ORAL | 0 refills | Status: DC | PRN

## 2024-02-11 MED ORDER — ROSUVASTATIN CALCIUM 10 MG PO TABS: 10.0000 mg | ORAL_TABLET | Freq: Every day | ORAL | 1 refills | Status: DC

## 2024-02-11 MED ORDER — PANTOPRAZOLE SODIUM 40 MG PO TBEC
40.0000 mg | DELAYED_RELEASE_TABLET | Freq: Every day | ORAL | 0 refills | Status: DC
Start: 2024-02-11 — End: 2024-03-10

## 2024-02-11 MED ORDER — CYCLOBENZAPRINE HCL 10 MG PO TABS: 10.0000 mg | ORAL_TABLET | Freq: Every day | ORAL | 1 refills | Status: DC

## 2024-02-11 MED ORDER — METFORMIN HCL ER 750 MG PO TB24: 750.0000 mg | ORAL_TABLET | Freq: Every day | ORAL | 1 refills | Status: DC

## 2024-02-11 MED ORDER — VALSARTAN-HYDROCHLOROTHIAZIDE 320-12.5 MG PO TABS: 1.0000 | ORAL_TABLET | Freq: Every day | ORAL | 1 refills | Status: DC

## 2024-02-11 MED ORDER — VITAMIN D (ERGOCALCIFEROL) 1.25 MG (50000 UNIT) PO CAPS: 50000.0000 [IU] | ORAL_CAPSULE | ORAL | 1 refills | Status: DC

## 2024-02-11 MED ORDER — ZOLPIDEM TARTRATE 10 MG PO TABS: 10.0000 mg | ORAL_TABLET | Freq: Every day | ORAL | 1 refills | Status: AC

## 2024-02-11 NOTE — Progress Notes (Signed)
 Name: Brenda Jones   MRN: [id number]    DOB: 25-Apr-1966   Date:02/11/2024       Progress Note  Subjective  Chief Complaint  Chief Complaint  Patient presents with   Medical Management of Chronic Issues   Discussed the use of AI scribe software for clinical note transcription with the patient, who gave verbal consent to proceed.  History of Present Illness Brenda Jones is a 58 year old female with COPD and GERD who presents with nausea and a recent COPD exacerbation.  She experienced a COPD exacerbation on July 15th, characterized by a dry cough. She was treated with doxycycline  twice daily, prednisone  for five days, and 'little pearls' for symptom management. Her symptoms have improved, with the cough subsiding and phlegm loosening.  She is experiencing nausea, which she associates with her recent medication regimen. She feels queasy and has a reduced appetite but no vomiting. She reports nausea and notes that she recently took prednisone . She has a history of GERD, which may have been exacerbated by her recent treatment. She is not currently taking any medication for GERD.  She has type 2 diabetes with a recent A1c of 6.5, slightly increased from 6.3. She does not monitor her blood sugar at home but is trying to reduce carbohydrate intake. She reports a lack of appetite and only drinking water  recently. She takes metformin  once daily for diabetes management.  She has a history of hypertension and dyslipidemia. Her blood pressure was noted to be 146/84, which she attributes to not taking her medication this morning due to her work schedule. She takes valsartan  HCTZ and rosuvastatin  for these conditions.  She has a history of breast cancer treated in 2013, resulting in peripheral neuropathy and osteopenia. She experiences burning, tingling, and sharp pain in her extremities, managed with gabapentin . She also takes Flexeril  for muscle cramps. She maintains hydration to help manage these  symptoms.  She works a shift from 3 AM to 3 PM, which affects her sleep. She takes Ambien  for sleep management. She has smoked three to four cigarettes recently and is considering quitting. She is working on reducing sweets in her diet.    Patient Active Problem List   Diagnosis Date Noted   Hypertension associated with type 2 diabetes mellitus (HCC) 06/12/2023   Vitamin D  deficiency 05/10/2022   History of colonic polyps    Polyp of transverse colon    Polyp of sigmoid colon    History of breast cancer 06/24/2019   Osteoarthritis of right knee 08/29/2016   Osteopenia due to cancer therapy 04/18/2015   GERD (gastroesophageal reflux disease) 04/18/2015   Muscle spasm 04/18/2015   Benign neoplasm of descending colon    Cervical high risk HPV (human papillomavirus) test positive 03/22/2015   Benign essential HTN 01/08/2015   Insomnia, persistent 01/08/2015   COPD, mild (HCC) 01/08/2015   Dyslipidemia 01/08/2015   Dysmetabolic syndrome 01/08/2015   Morbid obesity (HCC) 01/08/2015   Seasonal allergic rhinitis 01/08/2015   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   Arthralgia of multiple joints 01/08/2015    Past Surgical History:  Procedure Laterality Date   BLADDER SURGERY  2007   BREAST BIOPSY Left 2013   invasive mammary carcinoma   BREAST BIOPSY Left 04/30/2023   stereo bx, LEFT breast asymmetry, X clip-path pending   BREAST BIOPSY Left 04/30/2023   MM LT BREAST BX W LOC DEV 1ST LESION IMAGE BX SPEC STEREO GUIDE 04/30/2023 [facility]   BREAST LUMPECTOMY Left 2013  invasive mammary carcinoma with 2 positive lymph nodes. Margins were clear.    BREAST SURGERY     COLONOSCOPY WITH PROPOFOL  N/A 04/04/2015   Procedure: COLONOSCOPY WITH PROPOFOL ;  Surgeon: Rogelia Copping, MD;  Location: [facility];  Service: Endoscopy;  Laterality: N/A;   COLONOSCOPY WITH PROPOFOL  N/A 07/19/2020   Procedure: COLONOSCOPY WITH PROPOFOL ;  Surgeon: Copping Rogelia, MD;   Location: [facility];  Service: Endoscopy;  Laterality: N/A;   POLYPECTOMY  04/04/2015   Procedure: POLYPECTOMY;  Surgeon: Rogelia Copping, MD;  Location: [facility];  Service: Endoscopy;;    Family History  Problem Relation Age of Onset   Heart disease Mother    Diabetes Father    Hypertension Father    Breast cancer Neg Hx     Social History   Tobacco Use   Smoking status: Every Day    Current packs/day: 0.75    Average packs/day: 0.8 packs/day for 42.6 years (32.0 ttl pk-yrs)    Types: Cigarettes    Start date: 06/27/1981   Smokeless tobacco: Never  Substance Use Topics   Alcohol use: No    Alcohol/week: 0.0 standard drinks of alcohol     Current Outpatient Medications:    benzonatate  (TESSALON ) 100 MG capsule, Take 1-2 capsules (100-200 mg total) by mouth 4 (four) times daily., Disp: 40 capsule, Rfl: 0   Cyanocobalamin  (VITAMIN B-12) 1000 MCG SUBL, Place 1 tablet (1,000 mcg total) under the tongue 3 (three) times a week., Disp: 100 tablet, Rfl: 1   doxycycline  (VIBRA -TABS) 100 MG tablet, Take 1 tablet (100 mg total) by mouth 2 (two) times daily., Disp: 14 tablet, Rfl: 0   fluticasone  (FLONASE ) 50 MCG/ACT nasal spray, PLACE 2 SPRAYS INTO BOTH NOSTRILS AS NEEDED., Disp: 48 mL, Rfl: 1   Fluticasone -Umeclidin-Vilant (TRELEGY ELLIPTA ) 100-62.5-25 MCG/ACT AEPB, Inhale 1 puff into the lungs daily., Disp: 60 each, Rfl: 5   loratadine  (CLARITIN ) 10 MG tablet, Take 1 tablet (10 mg total) by mouth daily as needed (cough, allergies)., Disp: 30 tablet, Rfl: 2   meloxicam  (MOBIC ) 15 MG tablet, Take 1 tablet (15 mg total) by mouth daily., Disp: 90 tablet, Rfl: 0   pantoprazole  (PROTONIX ) 40 MG tablet, Take 1 tablet (40 mg total) by mouth daily., Disp: 30 tablet, Rfl: 0   promethazine  (PHENERGAN ) 12.5 MG tablet, Take 1 tablet (12.5 mg total) by mouth every 8 (eight) hours as needed for nausea or vomiting., Disp: 10 tablet, Rfl: 0   cyclobenzaprine  (FLEXERIL ) 10 MG tablet, Take 1  tablet (10 mg total) by mouth at bedtime., Disp: 90 tablet, Rfl: 1   gabapentin  (NEURONTIN ) 300 MG capsule, Take 1 capsule (300 mg total) by mouth 3 (three) times daily., Disp: 270 capsule, Rfl: 1   metFORMIN  (GLUCOPHAGE -XR) 750 MG 24 hr tablet, Take 1 tablet (750 mg total) by mouth daily with breakfast., Disp: 90 tablet, Rfl: 1   rosuvastatin  (CRESTOR ) 10 MG tablet, Take 1 tablet (10 mg total) by mouth daily., Disp: 90 tablet, Rfl: 1   valsartan -hydrochlorothiazide  (DIOVAN -HCT) 320-12.5 MG tablet, Take 1 tablet by mouth daily., Disp: 90 tablet, Rfl: 1   Vitamin D , Ergocalciferol , (DRISDOL ) 1.25 MG (50000 UNIT) CAPS capsule, Take 1 capsule (50,000 Units total) by mouth every 7 (seven) days., Disp: 12 capsule, Rfl: 1   zolpidem  (AMBIEN ) 10 MG tablet, Take 1 tablet (10 mg total) by mouth at bedtime., Disp: 90 tablet, Rfl: 1  Allergies  Allergen Reactions   Ace Inhibitors Cough    I personally reviewed active  problem list, medication list, allergies with the patient/caregiver today.   ROS  Ten systems reviewed and is negative except as mentioned in HPI    Objective Physical Exam  CONSTITUTIONAL: Patient appears well-developed and well-nourished. No distress. HEENT: Head atraumatic, normocephalic, neck supple. CARDIOVASCULAR: Normal rate, regular rhythm and normal heart sounds. No murmur heard. No BLE edema. PULMONARY: Effort normal and breath sounds normal. No respiratory distress. ABDOMINAL: There is no tenderness or distention. MUSCULOSKELETAL: Normal gait. Without gross motor or sensory deficit. PSYCHIATRIC: Patient has a normal mood and affect. Behavior is normal. Judgment and thought content normal.  Vitals:   02/11/24 0851 02/11/24 0920  BP: (!) 146/84 (!) 142/78  Pulse: 96   Resp: 16   SpO2: 99%   Weight: 203 lb (92.1 kg)   Height: 5' 2.75 (1.594 m)     Body mass index is 36.25 kg/m.  Recent Results (from the past 2160 hours)  Lipid panel     Status: None    Collection Time: 12/09/23  8:49 AM  Result Value Ref Range   Cholesterol 120 <200 mg/dL   HDL 52 > OR = 50 mg/dL   Triglycerides 73 <849 mg/dL   LDL Cholesterol (Calc) 53 mg/dL (calc)    Comment: Reference range: <100 . Desirable range <100 mg/dL for primary prevention;   <70 mg/dL for patients with CHD or diabetic patients  with > or = 2 CHD risk factors. SABRA LDL-C is now calculated using the Martin-Hopkins  calculation, which is a validated novel method providing  better accuracy than the Friedewald equation in the  estimation of LDL-C.  Gladis APPLETHWAITE et al. SANDREA. 7986;689(80): 2061-2068  (http://education.[facility].com/faq/FAQ164)    Total CHOL/HDL Ratio 2.3 <5.0 (calc)   Non-HDL Cholesterol (Calc) 68 <869 mg/dL (calc)    Comment: For patients with diabetes plus 1 major ASCVD risk  factor, treating to a non-HDL-C goal of <100 mg/dL  (LDL-C of <29 mg/dL) is considered a therapeutic  option.   Microalbumin / creatinine urine ratio     Status: None   Collection Time: 12/09/23  8:49 AM  Result Value Ref Range   Creatinine, Urine 31 20 - 275 mg/dL   Microalb, Ur 0.4 mg/dL    Comment: Reference Range Not established    Microalb Creat Ratio 13 <30 mg/g creat    Comment: . The ADA defines abnormalities in albumin excretion as follows: SABRA Albuminuria Category        Result (mg/g creatinine) . Normal to Mildly increased   <30 Moderately increased         30-299  Severely increased           > OR = 300 . The ADA recommends that at least two of three specimens collected within a 3-6 month period be abnormal before considering a patient to be within a diagnostic category.   CBC with Differential/Platelet     Status: Abnormal   Collection Time: 12/09/23  8:49 AM  Result Value Ref Range   WBC 9.4 3.8 - 10.8 Thousand/uL   RBC 5.05 3.80 - 5.10 Million/uL   Hemoglobin 13.5 11.7 - 15.5 g/dL   HCT 55.9 64.9 - 54.9 %   MCV 87.1 80.0 - 100.0 fL   MCH 26.7 (L) 27.0 - 33.0 pg    MCHC 30.7 (L) 32.0 - 36.0 g/dL    Comment: For adults, a slight decrease in the calculated MCHC value (in the range of 30 to 32 g/dL) is most likely not clinically significant;  however, it should be interpreted with caution in correlation with other red cell parameters and the patient's clinical condition.    RDW 14.9 11.0 - 15.0 %   Platelets 287 140 - 400 Thousand/uL   MPV 11.5 7.5 - 12.5 fL   Neutro Abs 5,151 1,500 - 7,800 cells/uL   Absolute Lymphocytes 3,563 850 - 3,900 cells/uL   Absolute Monocytes 470 200 - 950 cells/uL   Eosinophils Absolute 169 15 - 500 cells/uL   Basophils Absolute 47 0 - 200 cells/uL   Neutrophils Relative % 54.8 %   Total Lymphocyte 37.9 %   Monocytes Relative 5.0 %   Eosinophils Relative 1.8 %   Basophils Relative 0.5 %  Comprehensive metabolic panel with GFR     Status: None   Collection Time: 12/09/23  8:49 AM  Result Value Ref Range   Glucose, Bld 87 65 - 99 mg/dL    Comment: .            Fasting reference interval .    BUN 9 7 - 25 mg/dL   Creat 9.22 9.49 - 8.96 mg/dL   eGFR 89 > OR = 60 fO/fpw/8.26f7   BUN/Creatinine Ratio SEE NOTE: 6 - 22 (calc)    Comment:    Not Reported: BUN and Creatinine are within    reference range. .    Sodium 139 135 - 146 mmol/L   Potassium 4.4 3.5 - 5.3 mmol/L   Chloride 104 98 - 110 mmol/L   CO2 28 20 - 32 mmol/L   Calcium  10.2 8.6 - 10.4 mg/dL   Total Protein 7.2 6.1 - 8.1 g/dL   Albumin 4.4 3.6 - 5.1 g/dL   Globulin 2.8 1.9 - 3.7 g/dL (calc)   AG Ratio 1.6 1.0 - 2.5 (calc)   Total Bilirubin 0.3 0.2 - 1.2 mg/dL   Alkaline phosphatase (APISO) 93 37 - 153 U/L   AST 14 10 - 35 U/L   ALT 13 6 - 29 U/L  VITAMIN D  25 Hydroxy (Vit-D Deficiency, Fractures)     Status: None   Collection Time: 12/09/23  8:49 AM  Result Value Ref Range   Vit D, 25-Hydroxy 84 30 - 100 ng/mL    Comment: Vitamin D  Status         25-OH Vitamin D : . Deficiency:                    <20 ng/mL Insufficiency:             20 - 29  ng/mL Optimal:                 > or = 30 ng/mL . For 25-OH Vitamin D  testing on patients on  D2-supplementation and patients for whom quantitation  of D2 and D3 fractions is required, the [facility](TM) 25-OH VIT D, (D2,D3), LC/MS/MS is recommended: order  code 07111 (patients >54yrs). . See Note 1 . Note 1 . For additional information, please refer to  http://education.[facility].com/faq/FAQ199  (This link is being provided for informational/ educational purposes only.)   B12 and Folate Panel     Status: Abnormal   Collection Time: 12/09/23  8:49 AM  Result Value Ref Range   Vitamin B-12 336 200 - 1,100 pg/mL    Comment: . Please Note: Although the reference range for vitamin B12 is 8280459740 pg/mL, it has been reported that between 5 and 10% of patients with values between 200 and 400 pg/mL may experience neuropsychiatric and hematologic abnormalities  due to occult B12 deficiency; less than 1% of patients with values above 400 pg/mL will have symptoms. .    Folate 5.1 (L) ng/mL    Comment:                            Reference Range                            Low:           <3.4                            Borderline:    3.4-5.4                            Normal:        >5.4 .   POCT glycosylated hemoglobin (Hb A1C)     Status: Abnormal   Collection Time: 02/11/24  8:58 AM  Result Value Ref Range   Hemoglobin A1C 6.5 (A) 4.0 - 5.6 %   HbA1c POC (<> result, manual entry)     HbA1c, POC (prediabetic range)     HbA1c, POC (controlled diabetic range)      Diabetic Foot Exam:     PHQ2/9:    02/11/2024    8:45 AM 02/04/2024    1:23 PM 12/09/2023    7:57 AM 09/30/2023    3:36 PM 06/12/2023    9:08 AM  Depression screen PHQ 2/9  Decreased Interest 0 0 0 0 0  Down, Depressed, Hopeless 0 0 0 0 0  PHQ - 2 Score 0 0 0 0 0  Altered sleeping     0  Tired, decreased energy     0  Change in appetite     0  Feeling bad or failure about yourself      0  Trouble  concentrating     0  Moving slowly or fidgety/restless     0  Suicidal thoughts     0  PHQ-9 Score     0    phq 9 is negative  Fall Risk:    02/11/2024    8:44 AM 02/04/2024    1:22 PM 12/09/2023    7:52 AM 06/12/2023    9:08 AM 02/12/2023    8:09 AM  Fall Risk   Falls in the past year? 0 0 0 0 1  Number falls in past yr: 0 0 0 0 0  Injury with Fall? 0 0 0 0 1  Risk for fall due to : No Fall Risks No Fall Risks No Fall Risks Impaired balance/gait History of fall(s)  Follow up Falls evaluation completed Falls evaluation completed Falls prevention discussed;Education provided;Falls evaluation completed Falls prevention discussed Falls prevention discussed;Education provided;Falls evaluation completed      Assessment & Plan COPD exacerbation (improving) COPD exacerbation improving with reduced symptoms. She is able to return to work. - Complete current course of doxycycline  and prednisone . - Continue using Trelegy once daily.  Gastroesophageal reflux disease (GERD) likely exacerbated by recent prednisone  use GERD symptoms likely exacerbated by prednisone , causing nausea and queasiness. - Prescribe pantoprazole  (Protonix ) 30 tablets. - Instruct to take pantoprazole  daily for one week, then every other day, then every third day as needed.  Nausea likely secondary to recent medications (prednisone ) Nausea likely secondary to prednisone   use, causing queasiness and reduced appetite. - Prescribe promethazine  for nausea, to be taken at night due to sedative effects. - Provide 10 tablets of promethazine .  Type 2 diabetes mellitus with complications A1c increased to 6.5, likely due to illness and prednisone . She is managing diet by reducing carbohydrates. - Continue metformin  500 mg once daily. - Provide refills for metformin .  Hypertension Hypertension being managed, possibly affected by missed medication and salty food intake. - Continue valsartan /HCTZ 320/12.5 mg. - Recheck blood  pressure before leaving the clinic.  Dyslipidemia Dyslipidemia managed with rosuvastatin  without side effects. - Continue rosuvastatin  (Crestor ). - Provide refills for rosuvastatin .  Morbid Obesity (BMI >35) BMI over 35 with co-morbidities such as DM and HTN, qualifying as morbidly obese. Recent weight gain may be attributed to prednisone  use. - Encourage continued dietary modifications to reduce sugar intake.  Nicotine  dependence (current smoker) Current smoker, reduced to three or four cigarettes. Encouragement to quit smoking was provided. - Encourage smoking cessation to prevent COPD exacerbations.  Chemotherapy-induced peripheral neuropathy Experiences burning, tingling, and sharp pain in extremities due to chemotherapy-induced peripheral neuropathy. Gabapentin  provides some relief. - Continue gabapentin  for neuropathy.  Muscle cramps likely secondary to prior cancer therapy Experiences muscle cramps likely secondary to prior cancer therapy. Flexeril  provides relief. - Continue Flexeril  for muscle cramps. - Ensure adequate hydration.  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 On treatment holiday from alendronate due to low fracture risk. - Monitor bone density and fracture risk. - Ensure follow-up with bone density testing as needed.  Shift work sleep disorder Works a shift from 3 AM to 3 PM, contributing to sleep disorder. Takes Ambien  to aid sleep. - Continue Ambien  for sleep. - Provide refills for Ambien .

## 2024-02-13 ENCOUNTER — Other Ambulatory Visit: Payer: Self-pay | Admitting: Family Medicine

## 2024-02-13 DIAGNOSIS — I10 Essential (primary) hypertension: Secondary | ICD-10-CM

## 2024-03-10 ENCOUNTER — Other Ambulatory Visit: Payer: Self-pay | Admitting: Family Medicine

## 2024-03-10 DIAGNOSIS — K219 Gastro-esophageal reflux disease without esophagitis: Secondary | ICD-10-CM

## 2024-04-06 ENCOUNTER — Ambulatory Visit
Admission: RE | Admit: 2024-04-06 | Discharge: 2024-04-06 | Disposition: A | Discharge status: Home / Self Care | Source: Ambulatory Visit | Attending: Family Medicine | Admitting: Family Medicine

## 2024-04-06 ENCOUNTER — Other Ambulatory Visit: Payer: Self-pay | Admitting: Family Medicine

## 2024-04-06 DIAGNOSIS — N63 Unspecified lump in unspecified breast: Secondary | ICD-10-CM | POA: Diagnosis not present

## 2024-04-06 DIAGNOSIS — R928 Other abnormal and inconclusive findings on diagnostic imaging of breast: Secondary | ICD-10-CM

## 2024-04-06 DIAGNOSIS — Z0000 Encounter for general adult medical examination without abnormal findings: Secondary | ICD-10-CM

## 2024-04-06 DIAGNOSIS — Z1231 Encounter for screening mammogram for malignant neoplasm of breast: Secondary | ICD-10-CM | POA: Diagnosis not present

## 2024-04-06 DIAGNOSIS — R92323 Mammographic fibroglandular density, bilateral breasts: Secondary | ICD-10-CM | POA: Diagnosis not present

## 2024-05-13 ENCOUNTER — Other Ambulatory Visit: Payer: Self-pay | Admitting: Emergency Medicine

## 2024-05-13 ENCOUNTER — Ambulatory Visit: Admitting: Family Medicine

## 2024-05-13 DIAGNOSIS — E119 Type 2 diabetes mellitus without complications: Secondary | ICD-10-CM

## 2024-07-01 ENCOUNTER — Other Ambulatory Visit
Admission: RE | Admit: 2024-07-01 | Discharge: 2024-07-01 | Disposition: A | Discharge status: Home / Self Care | Source: Ambulatory Visit | Attending: Family Medicine | Admitting: Family Medicine

## 2024-07-01 ENCOUNTER — Ambulatory Visit: Admitting: Family Medicine

## 2024-07-01 ENCOUNTER — Encounter (HOSPITAL_COMMUNITY): Payer: Self-pay

## 2024-07-01 ENCOUNTER — Encounter: Payer: Self-pay | Admitting: Family Medicine

## 2024-07-01 ENCOUNTER — Other Ambulatory Visit: Payer: Self-pay

## 2024-07-01 ENCOUNTER — Ambulatory Visit: Payer: Self-pay | Admitting: Family Medicine

## 2024-07-01 ENCOUNTER — Inpatient Hospital Stay
Admission: AD | Admit: 2024-07-01 | Discharge: 2024-07-02 | DRG: 690 | Disposition: A | Discharge status: Home / Self Care | Source: Ambulatory Visit | Attending: Obstetrics and Gynecology | Admitting: Obstetrics and Gynecology

## 2024-07-01 ENCOUNTER — Ambulatory Visit
Admission: RE | Admit: 2024-07-01 | Discharge: 2024-07-01 | Disposition: A | Discharge status: Home / Self Care | Source: Ambulatory Visit | Attending: Family Medicine | Admitting: Family Medicine

## 2024-07-01 VITALS — BP 118/76 | HR 122 | Temp 98.7°F | Resp 16 | Ht 62.75 in | Wt 195.8 lb

## 2024-07-01 DIAGNOSIS — I152 Hypertension secondary to endocrine disorders: Secondary | ICD-10-CM | POA: Diagnosis present

## 2024-07-01 DIAGNOSIS — J449 Chronic obstructive pulmonary disease, unspecified: Secondary | ICD-10-CM

## 2024-07-01 DIAGNOSIS — Z7984 Long term (current) use of oral hypoglycemic drugs: Secondary | ICD-10-CM

## 2024-07-01 DIAGNOSIS — Z79899 Other long term (current) drug therapy: Secondary | ICD-10-CM | POA: Diagnosis not present

## 2024-07-01 DIAGNOSIS — R197 Diarrhea, unspecified: Secondary | ICD-10-CM

## 2024-07-01 DIAGNOSIS — R300 Dysuria: Secondary | ICD-10-CM

## 2024-07-01 DIAGNOSIS — R059 Cough, unspecified: Secondary | ICD-10-CM | POA: Diagnosis not present

## 2024-07-01 DIAGNOSIS — Z888 Allergy status to other drugs, medicaments and biological substances status: Secondary | ICD-10-CM

## 2024-07-01 DIAGNOSIS — E66812 Obesity, class 2: Secondary | ICD-10-CM | POA: Diagnosis present

## 2024-07-01 DIAGNOSIS — Z791 Long term (current) use of non-steroidal anti-inflammatories (NSAID): Secondary | ICD-10-CM

## 2024-07-01 DIAGNOSIS — G620 Drug-induced polyneuropathy: Secondary | ICD-10-CM | POA: Diagnosis present

## 2024-07-01 DIAGNOSIS — Z7951 Long term (current) use of inhaled steroids: Secondary | ICD-10-CM | POA: Diagnosis not present

## 2024-07-01 DIAGNOSIS — N1330 Unspecified hydronephrosis: Secondary | ICD-10-CM | POA: Diagnosis not present

## 2024-07-01 DIAGNOSIS — Z8249 Family history of ischemic heart disease and other diseases of the circulatory system: Secondary | ICD-10-CM

## 2024-07-01 DIAGNOSIS — E1159 Type 2 diabetes mellitus with other circulatory complications: Secondary | ICD-10-CM

## 2024-07-01 DIAGNOSIS — E7849 Other hyperlipidemia: Secondary | ICD-10-CM | POA: Diagnosis not present

## 2024-07-01 DIAGNOSIS — N12 Tubulo-interstitial nephritis, not specified as acute or chronic: Secondary | ICD-10-CM | POA: Diagnosis not present

## 2024-07-01 DIAGNOSIS — R10A2 Flank pain, left side: Secondary | ICD-10-CM | POA: Insufficient documentation

## 2024-07-01 DIAGNOSIS — Z853 Personal history of malignant neoplasm of breast: Secondary | ICD-10-CM | POA: Diagnosis not present

## 2024-07-01 DIAGNOSIS — R5381 Other malaise: Secondary | ICD-10-CM | POA: Diagnosis present

## 2024-07-01 DIAGNOSIS — Z833 Family history of diabetes mellitus: Secondary | ICD-10-CM | POA: Diagnosis not present

## 2024-07-01 DIAGNOSIS — R112 Nausea with vomiting, unspecified: Secondary | ICD-10-CM

## 2024-07-01 DIAGNOSIS — F17210 Nicotine dependence, cigarettes, uncomplicated: Secondary | ICD-10-CM | POA: Diagnosis present

## 2024-07-01 DIAGNOSIS — E1169 Type 2 diabetes mellitus with other specified complication: Secondary | ICD-10-CM | POA: Diagnosis present

## 2024-07-01 DIAGNOSIS — K219 Gastro-esophageal reflux disease without esophagitis: Secondary | ICD-10-CM | POA: Diagnosis not present

## 2024-07-01 DIAGNOSIS — Z923 Personal history of irradiation: Secondary | ICD-10-CM | POA: Diagnosis not present

## 2024-07-01 DIAGNOSIS — Q6239 Other obstructive defects of renal pelvis and ureter: Secondary | ICD-10-CM | POA: Diagnosis not present

## 2024-07-01 DIAGNOSIS — Z6834 Body mass index (BMI) 34.0-34.9, adult: Secondary | ICD-10-CM

## 2024-07-01 DIAGNOSIS — T451X5A Adverse effect of antineoplastic and immunosuppressive drugs, initial encounter: Secondary | ICD-10-CM | POA: Diagnosis present

## 2024-07-01 LAB — POCT GLYCOSYLATED HEMOGLOBIN (HGB A1C): Hemoglobin A1C: 5.8 % — AB (ref 4.0–5.6)

## 2024-07-01 LAB — COMPREHENSIVE METABOLIC PANEL WITH GFR
ALT: 62 U/L — ABNORMAL HIGH (ref 0–44)
AST: 62 U/L — ABNORMAL HIGH (ref 15–41)
Albumin: 3.7 g/dL (ref 3.5–5.0)
Alkaline Phosphatase: 129 U/L — ABNORMAL HIGH (ref 38–126)
Anion gap: 14 (ref 5–15)
BUN: 16 mg/dL (ref 6–20)
CO2: 24 mmol/L (ref 22–32)
Calcium: 9.5 mg/dL (ref 8.9–10.3)
Chloride: 96 mmol/L — ABNORMAL LOW (ref 98–111)
Creatinine, Ser: 1.05 mg/dL — ABNORMAL HIGH (ref 0.44–1.00)
GFR, Estimated: >60 mL/min (ref >60)
Glucose, Bld: 97 mg/dL (ref 70–99)
Potassium: 3.3 mmol/L — ABNORMAL LOW (ref 3.5–5.1)
Sodium: 134 mmol/L — ABNORMAL LOW (ref 135–145)
Total Bilirubin: 0.9 mg/dL (ref 0.0–1.2)
Total Protein: 7.7 g/dL (ref 6.5–8.1)

## 2024-07-01 LAB — CBC WITH DIFFERENTIAL/PLATELET
Abs Immature Granulocytes: 0.06 K/uL (ref 0.00–0.07)
Basophils Absolute: 0.1 K/uL (ref 0.0–0.1)
Basophils Relative: 1 %
Eosinophils Absolute: 0.1 K/uL (ref 0.0–0.5)
Eosinophils Relative: 1 %
HCT: 39.4 % (ref 36.0–46.0)
Hemoglobin: 13.2 g/dL (ref 12.0–15.0)
Immature Granulocytes: 1 %
Lymphocytes Relative: 21 %
Lymphs Abs: 2.3 K/uL (ref 0.7–4.0)
MCH: 27.2 pg (ref 26.0–34.0)
MCHC: 33.5 g/dL (ref 30.0–36.0)
MCV: 81.1 fL (ref 80.0–100.0)
Monocytes Absolute: 1.3 K/uL — ABNORMAL HIGH (ref 0.1–1.0)
Monocytes Relative: 12 %
Neutro Abs: 7.2 K/uL (ref 1.7–7.7)
Neutrophils Relative %: 64 %
Platelets: 206 K/uL (ref 150–400)
RBC: 4.86 MIL/uL (ref 3.87–5.11)
RDW: 15.6 % — ABNORMAL HIGH (ref 11.5–15.5)
WBC: 11 K/uL — ABNORMAL HIGH (ref 4.0–10.5)
nRBC: 0 % (ref 0.0–0.2)

## 2024-07-01 LAB — POCT URINE DIPSTICK
Bilirubin, UA: NEGATIVE
Glucose, UA: NEGATIVE mg/dL
Nitrite, UA: NEGATIVE
POC PROTEIN,UA: 100 — AB
Spec Grav, UA: 1.025 (ref 1.010–1.025)
Urobilinogen, UA: 2 U/dL — AB
pH, UA: 6 (ref 5.0–8.0)

## 2024-07-01 LAB — CBC
HCT: 36.3 % (ref 36.0–46.0)
Hemoglobin: 12.4 g/dL (ref 12.0–15.0)
MCH: 26.8 pg (ref 26.0–34.0)
MCHC: 34.2 g/dL (ref 30.0–36.0)
MCV: 78.6 fL — ABNORMAL LOW (ref 80.0–100.0)
Platelets: 196 K/uL (ref 150–400)
RBC: 4.62 MIL/uL (ref 3.87–5.11)
RDW: 15.2 % (ref 11.5–15.5)
WBC: 9.9 K/uL (ref 4.0–10.5)
nRBC: 0 % (ref 0.0–0.2)

## 2024-07-01 LAB — POC COVID19/FLU A&B COMBO
Covid Antigen, POC: NEGATIVE
Influenza A Antigen, POC: NEGATIVE
Influenza B Antigen, POC: NEGATIVE

## 2024-07-01 LAB — HIV ANTIBODY (ROUTINE TESTING W REFLEX): HIV Screen 4th Generation wRfx: NONREACTIVE

## 2024-07-01 LAB — MAGNESIUM: Magnesium: 2.1 mg/dL (ref 1.7–2.4)

## 2024-07-01 LAB — GLUCOSE, CAPILLARY: Glucose-Capillary: 128 mg/dL — ABNORMAL HIGH (ref 70–99)

## 2024-07-01 LAB — CREATININE, SERUM
Creatinine, Ser: 0.88 mg/dL (ref 0.44–1.00)
GFR, Estimated: >60 mL/min (ref >60)

## 2024-07-01 MED ORDER — ONDANSETRON HCL 4 MG PO TABS: 4.0000 mg | ORAL_TABLET | Freq: Four times a day (QID) | ORAL | Status: DC | PRN

## 2024-07-01 MED ORDER — INSULIN ASPART 100 UNIT/ML IJ SOLN
0.0000 [IU] | Freq: Three times a day (TID) | INTRAMUSCULAR | Status: DC
  Administered 2024-07-02: 5 [IU] via SUBCUTANEOUS
  Filled 2024-07-01: qty 5

## 2024-07-01 MED ORDER — GABAPENTIN 300 MG PO CAPS
300.0000 mg | ORAL_CAPSULE | Freq: Three times a day (TID) | ORAL | Status: DC
  Administered 2024-07-01 – 2024-07-02 (×2): 300 mg via ORAL
  Filled 2024-07-01 (×2): qty 1

## 2024-07-01 MED ORDER — ACETAMINOPHEN 650 MG RE SUPP: 650.0000 mg | Freq: Four times a day (QID) | RECTAL | Status: DC | PRN

## 2024-07-01 MED ORDER — ROSUVASTATIN CALCIUM 10 MG PO TABS
10.0000 mg | ORAL_TABLET | Freq: Every day | ORAL | Status: DC
  Administered 2024-07-01 – 2024-07-02 (×2): 10 mg via ORAL
  Filled 2024-07-01 (×2): qty 1

## 2024-07-01 MED ORDER — BUDESON-GLYCOPYRROL-FORMOTEROL 160-9-4.8 MCG/ACT IN AERO
2.0000 | INHALATION_SPRAY | Freq: Two times a day (BID) | RESPIRATORY_TRACT | Status: DC
  Administered 2024-07-01 – 2024-07-02 (×2): 2 via RESPIRATORY_TRACT
  Filled 2024-07-01: qty 5.9

## 2024-07-01 MED ORDER — HEPARIN SODIUM (PORCINE) 5000 UNIT/ML IJ SOLN
5000.0000 [IU] | Freq: Three times a day (TID) | INTRAMUSCULAR | Status: DC
  Administered 2024-07-01 – 2024-07-02 (×2): 5000 [IU] via SUBCUTANEOUS
  Filled 2024-07-01 (×2): qty 1

## 2024-07-01 MED ORDER — CYCLOBENZAPRINE HCL 10 MG PO TABS
10.0000 mg | ORAL_TABLET | Freq: Every day | ORAL | Status: DC
  Administered 2024-07-01: 10 mg via ORAL
  Filled 2024-07-01: qty 1

## 2024-07-01 MED ORDER — LACTATED RINGERS IV SOLN: INTRAVENOUS | Status: DC

## 2024-07-01 MED ORDER — INSULIN ASPART 100 UNIT/ML IJ SOLN: 0.0000 [IU] | Freq: Every day | INTRAMUSCULAR | Status: DC

## 2024-07-01 MED ORDER — SODIUM CHLORIDE 0.9 % IV SOLN
2.0000 g | INTRAVENOUS | Status: DC
  Administered 2024-07-01: 2 g via INTRAVENOUS
  Filled 2024-07-01 (×2): qty 20

## 2024-07-01 MED ORDER — SODIUM CHLORIDE 0.9% FLUSH
3.0000 mL | Freq: Two times a day (BID) | INTRAVENOUS | Status: DC
  Administered 2024-07-01: 3 mL via INTRAVENOUS

## 2024-07-01 MED ORDER — ACETAMINOPHEN 325 MG PO TABS: 650.0000 mg | ORAL_TABLET | Freq: Four times a day (QID) | ORAL | Status: DC | PRN

## 2024-07-01 MED ORDER — ONDANSETRON HCL 4 MG/2ML IJ SOLN
4.0000 mg | Freq: Four times a day (QID) | INTRAMUSCULAR | Status: DC | PRN
  Administered 2024-07-01: 4 mg via INTRAVENOUS
  Filled 2024-07-01: qty 2

## 2024-07-01 MED ORDER — PANTOPRAZOLE SODIUM 40 MG PO TBEC
40.0000 mg | DELAYED_RELEASE_TABLET | Freq: Every day | ORAL | Status: DC
  Administered 2024-07-01 – 2024-07-02 (×2): 40 mg via ORAL
  Filled 2024-07-01 (×2): qty 1

## 2024-07-01 MED ORDER — SENNOSIDES-DOCUSATE SODIUM 8.6-50 MG PO TABS: 1.0000 | ORAL_TABLET | Freq: Every evening | ORAL | Status: DC | PRN

## 2024-07-01 MED ORDER — ONDANSETRON 4 MG PO TBDP
4.0000 mg | ORAL_TABLET | Freq: Three times a day (TID) | ORAL | 0 refills | Status: DC | PRN
  Filled 2024-07-01: qty 20, 7d supply, fill #0

## 2024-07-01 MED ORDER — ZOLPIDEM TARTRATE 5 MG PO TABS: 10.0000 mg | ORAL_TABLET | Freq: Every day | ORAL | Status: DC

## 2024-07-01 MED ORDER — PSYLLIUM 95 % PO PACK
1.0000 | PACK | Freq: Every day | ORAL | Status: DC
  Administered 2024-07-01 – 2024-07-02 (×2): 1 via ORAL
  Filled 2024-07-01 (×3): qty 1

## 2024-07-01 NOTE — Progress Notes (Signed)
 Received a page regarding need for direct admission for this patient.  Spoke with Dr. Glenard she states patient with nausea vomiting and flank pain with imaging showing pyelonephritis she is requesting admission for pyelonephritis given patient's inability to tolerate p.o.  Patient accepted for direct admission for telemetry diagnosis of pyelonephritis.  Morene Bathe, MD [facility] DEL. [facility]

## 2024-07-01 NOTE — Progress Notes (Signed)
 Name: Brenda Jones   MRN: [id number]    DOB: [birth year]   Date:07/01/2024       Progress Note  Subjective  Chief Complaint  Chief Complaint  Patient presents with   Medical Management of Chronic Issues   Cough    And loss of appetite x3 days    Discussed the use of AI scribe software for clinical note transcription with the patient, who gave verbal consent to proceed.  History of Present Illness Brenda Jones is a 58 year old female with diabetes and COPD who presents with left flank pain and gastrointestinal symptoms.  She has been experiencing left flank pain since Sunday, described as severe and 'something moving' in her side. The pain radiates to the left lower quadrant and occurs in waves, with a constant soreness. Pain intensity is rated as 6 out of 10, escalating to 10 during waves, and worsens with movement such as walking or riding in a car.  She reports a lack of appetite since Sunday and has vomited after attempting to eat, with her last meal on Monday. She has been drinking water  but experiences nausea. Diarrhea has been present since the beginning of the month, with no bowel movements in the past two days but this morning had loose stools . No blood in urine but a burning sensation when urinating. She experiences chills but no fever.  She has type 2  diabetes with dyslipidemia , managed with metformin  750 mg once daily. She also has COPD, managed with Trelegy, and reports a chronic cough, which is her baseline symptom, with no increase in shortness of breath or wheezing.  She occasionally uses diapers due to urinary incontinence when coughing. She drove herself to the appointment and lives about ten minutes away from the clinic.    Patient Active Problem List   Diagnosis Date Noted   Hypertension associated with type 2 diabetes mellitus (HCC) 06/12/2023   Vitamin D  deficiency 05/10/2022   History of colonic polyps    Polyp of transverse colon    Polyp of sigmoid colon     History of breast cancer 06/24/2019   Osteoarthritis of right knee 08/29/2016   Osteopenia due to cancer therapy 04/18/2015   GERD (gastroesophageal reflux disease) 04/18/2015   Muscle spasm 04/18/2015   Benign neoplasm of descending colon    Cervical high risk HPV (human papillomavirus) test positive 03/22/2015   Benign essential HTN 01/08/2015   Insomnia, persistent 01/08/2015   COPD, mild (HCC) 01/08/2015   Dyslipidemia 01/08/2015   Dysmetabolic syndrome 01/08/2015   Morbid obesity (HCC) 01/08/2015   Seasonal allergic rhinitis 01/08/2015   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   Arthralgia of multiple joints 01/08/2015    Social History   Tobacco Use   Smoking status: Every Day    Current packs/day: 0.75    Average packs/day: 0.8 packs/day for 43.0 years (32.3 ttl pk-yrs)    Types: Cigarettes    Start date: 06/27/1981   Smokeless tobacco: Never  Substance Use Topics   Alcohol use: No    Alcohol/week: 0.0 standard drinks of alcohol     Current Outpatient Medications:    Cyanocobalamin  (VITAMIN B-12) 1000 MCG SUBL, Place 1 tablet (1,000 mcg total) under the tongue 3 (three) times a week., Disp: 100 tablet, Rfl: 1   cyclobenzaprine  (FLEXERIL ) 10 MG tablet, Take 1 tablet (10 mg total) by mouth at bedtime., Disp: 90 tablet, Rfl: 1   fluticasone  (FLONASE ) 50 MCG/ACT nasal spray, PLACE 2 SPRAYS INTO  BOTH NOSTRILS AS NEEDED., Disp: 48 mL, Rfl: 1   Fluticasone -Umeclidin-Vilant (TRELEGY ELLIPTA ) 100-62.5-25 MCG/ACT AEPB, Inhale 1 puff into the lungs daily., Disp: 60 each, Rfl: 5   gabapentin  (NEURONTIN ) 300 MG capsule, Take 1 capsule (300 mg total) by mouth 3 (three) times daily., Disp: 270 capsule, Rfl: 1   loratadine  (CLARITIN ) 10 MG tablet, Take 1 tablet (10 mg total) by mouth daily as needed (cough, allergies)., Disp: 30 tablet, Rfl: 2   meloxicam  (MOBIC ) 15 MG tablet, Take 1 tablet (15 mg total) by mouth daily., Disp: 90 tablet, Rfl: 0   metFORMIN   (GLUCOPHAGE -XR) 750 MG 24 hr tablet, Take 1 tablet (750 mg total) by mouth daily with breakfast., Disp: 90 tablet, Rfl: 1   ondansetron  (ZOFRAN -ODT) 4 MG disintegrating tablet, Take 1 tablet (4 mg total) by mouth every 8 (eight) hours as needed for nausea or vomiting., Disp: 20 tablet, Rfl: 0   pantoprazole  (PROTONIX ) 40 MG tablet, TAKE 1 TABLET BY MOUTH EVERY DAY, Disp: 90 tablet, Rfl: 0   rosuvastatin  (CRESTOR ) 10 MG tablet, Take 1 tablet (10 mg total) by mouth daily., Disp: 90 tablet, Rfl: 1   valsartan -hydrochlorothiazide  (DIOVAN -HCT) 320-12.5 MG tablet, Take 1 tablet by mouth daily., Disp: 90 tablet, Rfl: 1   Vitamin D , Ergocalciferol , (DRISDOL ) 1.25 MG (50000 UNIT) CAPS capsule, Take 1 capsule (50,000 Units total) by mouth every 7 (seven) days., Disp: 12 capsule, Rfl: 1   zolpidem  (AMBIEN ) 10 MG tablet, Take 1 tablet (10 mg total) by mouth at bedtime., Disp: 90 tablet, Rfl: 1  Allergies  Allergen Reactions   Ace Inhibitors Cough    ROS  Ten systems reviewed and is negative except as mentioned in HPI    Objective  Vitals:   07/01/24 0814  BP: 118/76  Pulse: (!) 122  Resp: 16  Temp: 98.7 F (37.1 C)  TempSrc: Oral  SpO2: 98%  Weight: 195 lb 12.8 oz (88.8 kg)  Height: 5' 2.75 (1.594 m)    Body mass index is 34.96 kg/m.   Physical Exam  CONSTITUTIONAL: Patient appears well-developed and well-nourished. No distress. HEENT: Head atraumatic, normocephalic, neck supple. CARDIOVASCULAR: Normal rate, regular rhythm and normal heart sounds. No murmur heard. No BLE edema. PULMONARY: Effort normal and breath sounds normal. No respiratory distress. ABDOMINAL: Left CVA tenderness present. There is no distention. Normal bowel sounds.  No rashes noticed . Pain with deep palpation but negative rebound tenderness  MUSCULOSKELETAL: Normal gait. Without gross motor or sensory deficit. PSYCHIATRIC: Patient has a normal mood and affect. Behavior is normal. Judgment and thought content  normal.  Recent Results (from the past 2160 hours)  POCT glycosylated hemoglobin (Hb A1C)     Status: Abnormal   Collection Time: 07/01/24  8:27 AM  Result Value Ref Range   Hemoglobin A1C 5.8 (A) 4.0 - 5.6 %   HbA1c POC (<> result, manual entry)     HbA1c, POC (prediabetic range)     HbA1c, POC (controlled diabetic range)    POC Covid19/Flu A&B Antigen     Status: None   Collection Time: 07/01/24  8:27 AM  Result Value Ref Range   Influenza A Antigen, POC Negative Negative   Influenza B Antigen, POC Negative Negative   Covid Antigen, POC Negative Negative      Assessment & Plan Left flank pain, rule out nephrolithiasis versus pyelonephritis, less likely diverticulitis  Acute left flank pain with nausea, vomiting, and diarrhea. Differential includes nephrolithiasis and pyelonephritis. No fever, mild chills present. Dysuria noted.  High heart rate likely due to pain. - Ordered CT scan of abdomen and pelvis. - Ordered urine culture. - Ordered CBC and renal function tests. - Prescribed Zofran  for nausea and vomiting. - Advised to hold metformin . - Referred to urologist if nephrolithiasis confirmed. - Consider hospital admission if infection confirmed.  Type 2 diabetes mellitus with dyslipidemia  Well-controlled with A1c of 5.8. Metformin  held due to diarrhea and poor oral intake. - Continue metformin  750 mg daily, hold  since not eating and has diarrhea , check CMP today   Chronic obstructive pulmonary disease Chronic cough consistent with baseline symptoms.  - Continue Trelegy for COPD management.

## 2024-07-01 NOTE — Plan of Care (Signed)

## 2024-07-01 NOTE — H&P (Addendum)
 History and Physical    Brenda Jones FMW:[id number] DOB: [birth year] DOA: 07/01/2024  DOS: the patient was seen and examined on 07/01/2024  PCP: Glenard Mire, MD   Patient coming from: Home  I have personally briefly reviewed patient's old medical records in [facility] and CareEverywhere  HPI:   Brenda Jones is a 58 y.o. year old female with medical history of hypertension, hyperlipidemia, type 2 diabetes, class II obesity, COPD directly admitted from clinic for pyelonephritis.  Patient has been having left-sided flank pain since Sunday that waxes and wanes.  She states over the last week she developed some pressure when she would urinate and then it progressed to the pain on her left side. She has been having upset stomach along with nausea and vomiting.  She denies fevers but states she has been having chills. When she presented to her PCP today and endorses concerns they ordered lab work and imaging which showed concern for UTI complicated by pyelonephritis.  She reports she regularly takes her medications. She spokes 1 ppd.   Review of Systems: As mentioned in the history of present illness. All other systems reviewed and are negative.   Past Medical History:  Diagnosis Date   Allergy    Arthritis    Breast CA (HCC)    Breast cancer (HCC) 2013   left breast lumpectomy with chemo and rad tx   COPD (chronic obstructive pulmonary disease) (HCC)    Headache    occasional - thinks it is from BP meds   Hypertension    Insomnia    Metabolic syndrome    Obesity    Peripheral neuropathy    Personal history of chemotherapy    Personal history of radiation therapy     Past Surgical History:  Procedure Laterality Date   BLADDER SURGERY  2007   BREAST BIOPSY Left 2013   invasive mammary carcinoma   BREAST BIOPSY Left 04/30/2023   stereo bx, LEFT breast asymmetry, X clip-path pending   BREAST BIOPSY Left 04/30/2023   MM LT BREAST BX W LOC DEV 1ST LESION IMAGE BX  SPEC STEREO GUIDE 04/30/2023 [facility]   BREAST LUMPECTOMY Left 2013   invasive mammary carcinoma with 2 positive lymph nodes. Margins were clear.    BREAST SURGERY     COLONOSCOPY WITH PROPOFOL  N/A 04/04/2015   Procedure: COLONOSCOPY WITH PROPOFOL ;  Surgeon: Rogelia Copping, MD;  Location: [facility];  Service: Endoscopy;  Laterality: N/A;   COLONOSCOPY WITH PROPOFOL  N/A 07/19/2020   Procedure: COLONOSCOPY WITH PROPOFOL ;  Surgeon: Copping Rogelia, MD;  Location: [facility];  Service: Endoscopy;  Laterality: N/A;   POLYPECTOMY  04/04/2015   Procedure: POLYPECTOMY;  Surgeon: Rogelia Copping, MD;  Location: [facility];  Service: Endoscopy;;     Allergies  Allergen Reactions   Ace Inhibitors Cough    Family History  Problem Relation Age of Onset   Heart disease Mother    Diabetes Father    Hypertension Father    Breast cancer Neg Hx     Prior to Admission medications   Medication Sig Start Date End Date Taking? Authorizing Provider  Cyanocobalamin  (VITAMIN B-12) 1000 MCG SUBL Place 1 tablet (1,000 mcg total) under the tongue 3 (three) times a week. 02/13/23   Sowles, Krichna, MD  cyclobenzaprine  (FLEXERIL ) 10 MG tablet Take 1 tablet (10 mg total) by mouth at bedtime. 02/11/24   Sowles, Krichna, MD  fluticasone  (FLONASE ) 50 MCG/ACT nasal spray PLACE 2 SPRAYS INTO BOTH NOSTRILS AS NEEDED.  07/08/19   Sowles, Krichna, MD  Fluticasone -Umeclidin-Vilant (TRELEGY ELLIPTA ) 100-62.5-25 MCG/ACT AEPB Inhale 1 puff into the lungs daily. 10/21/23   Sowles, Krichna, MD  gabapentin  (NEURONTIN ) 300 MG capsule Take 1 capsule (300 mg total) by mouth 3 (three) times daily. 02/11/24   Sowles, Krichna, MD  loratadine  (CLARITIN ) 10 MG tablet Take 1 tablet (10 mg total) by mouth daily as needed (cough, allergies). 02/08/21   Rumball, Alison M, DO  meloxicam  (MOBIC ) 15 MG tablet Take 1 tablet (15 mg total) by mouth daily. 11/09/22   Sowles, Krichna, MD  metFORMIN  (GLUCOPHAGE -XR) 750 MG 24 hr tablet  Take 1 tablet (750 mg total) by mouth daily with breakfast. 02/11/24   Sowles, Krichna, MD  ondansetron  (ZOFRAN -ODT) 4 MG disintegrating tablet Take 1 tablet (4 mg total) by mouth every 8 (eight) hours as needed for nausea or vomiting. 07/01/24   Sowles, Krichna, MD  pantoprazole  (PROTONIX ) 40 MG tablet TAKE 1 TABLET BY MOUTH EVERY DAY 03/10/24   Sowles, Krichna, MD  rosuvastatin  (CRESTOR ) 10 MG tablet Take 1 tablet (10 mg total) by mouth daily. 02/11/24   Sowles, Krichna, MD  valsartan -hydrochlorothiazide  (DIOVAN -HCT) 320-12.5 MG tablet Take 1 tablet by mouth daily. 02/11/24   Sowles, Krichna, MD  Vitamin D , Ergocalciferol , (DRISDOL ) 1.25 MG (50000 UNIT) CAPS capsule Take 1 capsule (50,000 Units total) by mouth every 7 (seven) days. 02/11/24   Sowles, Krichna, MD  zolpidem  (AMBIEN ) 10 MG tablet Take 1 tablet (10 mg total) by mouth at bedtime. 02/11/24   Sowles, Krichna, MD    Social History:  reports that she has been smoking cigarettes. She started smoking about 43 years ago. She has a 32.3 pack-year smoking history. She has never used smokeless tobacco. She reports that she does not drink alcohol and does not use drugs.    Physical Exam: Vitals:   07/01/24 1737  BP: 123/73  Pulse: 64  Resp: (!) 22  Temp: 99.6 F (37.6 C)  SpO2: 99%       Labs on Admission: I have personally reviewed following labs and imaging studies  CBC: Recent Labs  Lab 07/01/24 1005 07/01/24 1741  WBC 11.0* 9.9  NEUTROABS 7.2  --   HGB 13.2 12.4  HCT 39.4 36.3  MCV 81.1 78.6*  PLT 206 196   Basic Metabolic Panel: Recent Labs  Lab 07/01/24 1005 07/01/24 1741  NA 134*  --   K 3.3*  --   CL 96*  --   CO2 24  --   GLUCOSE 97  --   BUN 16  --   CREATININE 1.05* 0.88  CALCIUM  9.5  --   MG  --  2.1   GFR: Estimated Creatinine Clearance: 72.4 mL/min (by C-G formula based on SCr of 0.88 mg/dL). Liver Function Tests: Recent Labs  Lab 07/01/24 1005  AST 62*  ALT 62*  ALKPHOS 129*  BILITOT 0.9   PROT 7.7  ALBUMIN 3.7   No results for input(s): LIPASE, AMYLASE in the last 168 hours. No results for input(s): AMMONIA in the last 168 hours. Coagulation Profile: No results for input(s): INR, PROTIME in the last 168 hours. Cardiac Enzymes: No results for input(s): CKTOTAL, CKMB, CKMBINDEX, TROPONINI, TROPONINIHS in the last 168 hours. BNP (last 3 results) No results for input(s): BNP in the last 8760 hours. HbA1C: Recent Labs    07/01/24 0827  HGBA1C 5.8*   CBG: No results for input(s): GLUCAP in the last 168 hours. Lipid Profile: No results for input(s): CHOL, HDL,  LDLCALC, TRIG, CHOLHDL, LDLDIRECT in the last 72 hours. Thyroid Function Tests: No results for input(s): TSH, T4TOTAL, FREET4, T3FREE, THYROIDAB in the last 72 hours. Anemia Panel: No results for input(s): VITAMINB12, FOLATE, FERRITIN, TIBC, IRON, RETICCTPCT in the last 72 hours. Urine analysis:    Component Value Date/Time   BILIRUBINUR negative 07/01/2024 0916   BILIRUBINUR Negative 01/06/2019 0913   KETONESUR moderate (40) (A) 07/01/2024 0916   PROTEINUR Negative 01/06/2019 0913   UROBILINOGEN 2.0 (A) 07/01/2024 0916   NITRITE Negative 07/01/2024 0916   NITRITE Negative 01/06/2019 0913   LEUKOCYTESUR Moderate (2+) (A) 07/01/2024 0916    Radiological Exams on Admission: I have personally reviewed images CT RENAL STONE STUDY Result Date: 07/01/2024 CLINICAL DATA:  Left-sided abdominal and flank pain. EXAM: CT ABDOMEN AND PELVIS WITHOUT CONTRAST TECHNIQUE: Multidetector CT imaging of the abdomen and pelvis was performed following the standard protocol without IV contrast. RADIATION DOSE REDUCTION: This exam was performed according to the departmental dose-optimization program which includes automated exposure control, adjustment of the mA and/or kV according to patient size and/or use of iterative reconstruction technique. COMPARISON:  09/18/2011  FINDINGS: Lower chest: No acute findings. Hepatobiliary: Stable small cyst again seen in the left lobe. No other liver lesions visualized on this unenhanced exam. Gallbladder is unremarkable. No evidence of biliary ductal dilatation. Pancreas: No mass or inflammatory process visualized on this unenhanced exam. Spleen:  Within normal limits in size. Adrenals/Urinary tract: Diffuse left renal swelling and perinephric soft tissue stranding are seen. Mild left renal pelvicaliectasis is noted, however, there is no evidence of ureteral dilatation. No urinary tract calculi identified. Unremarkable unopacified urinary bladder. Stomach/Bowel: Tiny hiatal hernia noted. No evidence of obstruction, inflammatory process, or abnormal fluid collections. Vascular/Lymphatic: No pathologically enlarged lymph nodes identified. No evidence of abdominal aortic aneurysm. Reproductive:  No mass or other significant abnormality. Other:  Stable small fat-containing umbilical hernia. Musculoskeletal:  No suspicious bone lesions identified. IMPRESSION: Diffuse left renal swelling and perinephric stranding, with mild pelvicaliectasis. No evidence of ureteral calculi or dilatation. Differential diagnosis includes recently passed calculus and pyelonephritis. Suggest correlation with urinalysis. Stable small fat-containing umbilical hernia, and tiny hiatal hernia. Electronically Signed   By: Norleen DELENA Kil M.D.   On: 07/01/2024 11:56    EKG: My personal interpretation of EKG shows: pending   Assessment/Plan Principal Problem:   Pyelonephritis Active Problems:   COPD, mild (HCC)   Hyperlipidemia associated with type 2 diabetes mellitus (HCC)   Class II obesity   GERD (gastroesophageal reflux disease)   Hypertension associated with type 2 diabetes mellitus (HCC)   Pyelonephritis Pt with urinary symptoms and flank pain found to have pyelonephritis. UA and imaging studies are consistent.  -Follow up urine culture, blood  cultures -Start Ceftriaxone  1 gm qd, change to oral when able to complete 5-7 days of therapy. -Monitor leukocytosis, trend fever curve -Monitor renal funciton daily -If clinical deterioration, then repeat imaging  Peripheral neuropathy: secondary to chemotherapy for her breast cancer. Continue her gabapentin .   HTN: Patient is normotensive so we will hold BP meds given active infection.  Hyperlipidemia: Continue home cholesterol  Type 2 diabetes: Hold metformin . Start sSSI  GERD: Continue home PPI with plan to transition to H2 blocker when able.  Loose stools: states over the last 2 days she had 2 bowel movements that were loose. Will add fiber to bulk her stools. CTM  Hx of breast cancer: left breast cancer in 2013 currently in remission. No longer follows with oncology per  pt and gets annual mammogram per recommendations.    VTE prophylaxis:  SQ Heparin   Diet: fHH Code Status:  Full Code Telemetry:  Admission status: Inpatient, Telemetry bed Patient is from: Home Anticipated d/c is to: Home Anticipated d/c is in: 2-3 days   Family Communication: Updated at bedside  Consults called: None   Severity of Illness: The appropriate patient status for this patient is INPATIENT. Inpatient status is judged to be reasonable and necessary in order to provide the required intensity of service to ensure the patient's safety. The patient's presenting symptoms, physical exam findings, and initial radiographic and laboratory data in the context of their chronic comorbidities is felt to place them at high risk for further clinical deterioration. Furthermore, it is not anticipated that the patient will be medically stable for discharge from the hospital within 2 midnights of admission.   * I certify that at the point of admission it is my clinical judgment that the patient will require inpatient hospital care spanning beyond 2 midnights from the point of admission due to high intensity of  service, high risk for further deterioration and high frequency of surveillance required.DEWAINE Morene Bathe, MD [place]ol[place] DEL. [facility]

## 2024-07-02 ENCOUNTER — Other Ambulatory Visit: Payer: Self-pay

## 2024-07-02 DIAGNOSIS — N12 Tubulo-interstitial nephritis, not specified as acute or chronic: Principal | ICD-10-CM

## 2024-07-02 LAB — GLUCOSE, CAPILLARY
Glucose-Capillary: 105 mg/dL — ABNORMAL HIGH (ref 70–99)
Glucose-Capillary: 253 mg/dL — ABNORMAL HIGH (ref 70–99)

## 2024-07-02 LAB — BASIC METABOLIC PANEL WITH GFR
Anion gap: 12 (ref 5–15)
BUN: 12 mg/dL (ref 6–20)
CO2: 23 mmol/L (ref 22–32)
Calcium: 8.9 mg/dL (ref 8.9–10.3)
Chloride: 98 mmol/L (ref 98–111)
Creatinine, Ser: 0.81 mg/dL (ref 0.44–1.00)
GFR, Estimated: >60 mL/min (ref >60)
Glucose, Bld: 104 mg/dL — ABNORMAL HIGH (ref 70–99)
Potassium: 3.3 mmol/L — ABNORMAL LOW (ref 3.5–5.1)
Sodium: 134 mmol/L — ABNORMAL LOW (ref 135–145)

## 2024-07-02 LAB — CBC
HCT: 34.1 % — ABNORMAL LOW (ref 36.0–46.0)
Hemoglobin: 11.7 g/dL — ABNORMAL LOW (ref 12.0–15.0)
MCH: 27.2 pg (ref 26.0–34.0)
MCHC: 34.3 g/dL (ref 30.0–36.0)
MCV: 79.3 fL — ABNORMAL LOW (ref 80.0–100.0)
Platelets: 208 K/uL (ref 150–400)
RBC: 4.3 MIL/uL (ref 3.87–5.11)
RDW: 15.4 % (ref 11.5–15.5)
WBC: 8.8 K/uL (ref 4.0–10.5)
nRBC: 0 % (ref 0.0–0.2)

## 2024-07-02 MED ORDER — CEFPODOXIME PROXETIL 200 MG PO TABS
200.0000 mg | ORAL_TABLET | Freq: Two times a day (BID) | ORAL | 0 refills | Status: AC
  Filled 2024-07-02: qty 12, 6d supply, fill #0

## 2024-07-02 NOTE — Plan of Care (Signed)

## 2024-07-02 NOTE — TOC CM/SW Note (Signed)
 Transition of Care [facility]) - Inpatient Brief Assessment   Patient Details  Name: Brenda Jones MRN: [id number] Date of Birth: 1966/03/21  Transition of Care [facility]) CM/SW Contact:    Shasta DELENA Daring, RN Phone Number: 07/02/2024, 8:59 AM   Clinical Narrative:  Transition of Care (TOC) Screening Note   Patient Details  Name: Brenda Jones Date of Birth: 1965-07-27   Transition of Care [facility]) CM/SW Contact:    Shasta DELENA Daring, RN Phone Number: 07/02/2024, 8:59 AM    Transition of Care Department [facility]) has reviewed patient and no TOC needs have been identified at this time. If new patient transition needs arise, please place a TOC consult.     Transition of Care Asessment: Insurance and Status: Insurance coverage has been reviewed Patient has primary care physician: Yes Home environment has been reviewed: came from home   Prior/Current Home Services: No current home services Social Drivers of Health Review: SDOH reviewed no interventions necessary Readmission risk has been reviewed: Yes Transition of care needs: no transition of care needs at this time

## 2024-07-02 NOTE — Plan of Care (Signed)
°  Problem: Education: Goal: Knowledge of General Education information will improve Description: Including pain rating scale, medication(s)/side effects and non-pharmacologic comfort measures Outcome: Progressing   Problem: Health Behavior/Discharge Planning: Goal: Ability to manage health-related needs will improve Outcome: Progressing   Problem: Clinical Measurements: Goal: Ability to maintain clinical measurements within normal limits will improve Outcome: Progressing Goal: Will remain free from infection Outcome: Progressing Goal: Diagnostic test results will improve Outcome: Progressing Goal: Respiratory complications will improve Outcome: Progressing Goal: Cardiovascular complication will be avoided Outcome: Progressing   Problem: Activity: Goal: Risk for activity intolerance will decrease Outcome: Progressing   Problem: Nutrition: Goal: Adequate nutrition will be maintained Outcome: Progressing   Problem: Coping: Goal: Level of anxiety will decrease Outcome: Progressing   Problem: Elimination: Goal: Will not experience complications related to bowel motility Outcome: Progressing Goal: Will not experience complications related to urinary retention Outcome: Progressing   Problem: Pain Managment: Goal: General experience of comfort will improve and/or be controlled Outcome: Progressing   Problem: Safety: Goal: Ability to remain free from injury will improve Outcome: Progressing   Problem: Skin Integrity: Goal: Risk for impaired skin integrity will decrease Outcome: Progressing   Problem: Education: Goal: Ability to describe self-care measures that may prevent or decrease complications (Diabetes Survival Skills Education) will improve Outcome: Progressing Goal: Individualized Educational Video(s) Outcome: Progressing   Problem: Coping: Goal: Ability to adjust to condition or change in health will improve Outcome: Progressing   Problem: Fluid  Volume: Goal: Ability to maintain a balanced intake and output will improve Outcome: Progressing   Problem: Nutritional: Goal: Maintenance of adequate nutrition will improve Outcome: Progressing Goal: Progress toward achieving an optimal weight will improve Outcome: Progressing   Problem: Skin Integrity: Goal: Risk for impaired skin integrity will decrease Outcome: Progressing   Problem: Tissue Perfusion: Goal: Adequacy of tissue perfusion will improve Outcome: Progressing

## 2024-07-02 NOTE — Discharge Summary (Signed)
 Shiah Berhow FMW:[id number] DOB: 07-Oct-1965 DOA: 07/01/2024  PCP: Sowles, Krichna, MD  Admit date: 07/01/2024 Discharge date: 07/02/2024  Time spent: 35 minutes  Recommendations for Outpatient Follow-up:  Pcp f/u 1 week, f/u culture results     Discharge Diagnoses:  Principal Problem:   Pyelonephritis Active Problems:   COPD, mild (HCC)   Hyperlipidemia associated with type 2 diabetes mellitus (HCC)   Class II obesity   GERD (gastroesophageal reflux disease)   History of breast cancer   Hypertension associated with type 2 diabetes mellitus (HCC)   Discharge Condition: stabe  Diet recommendation: heart healthy  Filed Weights   07/02/24 0445  Weight: 88.4 kg    History of present illness:  From admission h and p Brenda Jones is a 58 y.o. year old female with medical history of hypertension, hyperlipidemia, type 2 diabetes, class II obesity, COPD directly admitted from clinic for pyelonephritis.   Patient has been having left-sided flank pain since Sunday that waxes and wanes.  She states over the last week she developed some pressure when she would urinate and then it progressed to the pain on her left side. She has been having upset stomach along with nausea and vomiting.  She denies fevers but states she has been having chills. When she presented to her PCP today and endorses concerns they ordered lab work and imaging which showed concern for UTI complicated by pyelonephritis.   She reports she regularly takes her medications. She spokes 1 ppd.    Hospital Course:   Patient presents with several days malaise, dysuria, and then left flank pain. Urinalysis consistent w/ infection and CT consistent with left sided pyelo, no obstructing stone, abscess, or other complication. Treated with ceftriaxone  here and symptomatically much improved, flank pain resolved, ambulating without difficulty, tolerating diet. No urine culture ordered here but culture appears to have been  ordered by pcp who saw patient prior to arrival here, will need to f/u that result. Given response to ceftriaxone  with d/c with cefpodoxime. Advise close pcp f/u next week; return precautions reviewed. BPs normal here in setting of infection, home antihypertensive held pending pcp f/u.   Procedures: none   Consultations: none  Discharge Exam: Vitals:   07/02/24 0335 07/02/24 0728  BP: 132/64 119/72  Pulse: (!) 106 98  Resp: 20 17  Temp: 99.5 F (37.5 C) 97.9 F (36.6 C)  SpO2: 99% 97%    General: NAD Cardiovascular: rrr Respiratory: ctab Abdomen: soft, noon-tender, no flank tenderness  Discharge Instructions   Discharge Instructions     Increase activity slowly   Complete by: As directed       Allergies as of 07/02/2024       Reactions   Ace Inhibitors Cough        Medication List     PAUSE taking these medications    valsartan -hydrochlorothiazide  320-12.5 MG tablet Wait to take this until your doctor or other care provider tells you to start again. Commonly known as: DIOVAN -HCT Take 1 tablet by mouth daily.       STOP taking these medications    meloxicam  15 MG tablet Commonly known as: MOBIC        TAKE these medications    cefpodoxime 200 MG tablet Commonly known as: VANTIN Take 1 tablet (200 mg total) by mouth 2 (two) times daily for 6 days.   cyclobenzaprine  10 MG tablet Commonly known as: FLEXERIL  Take 1 tablet (10 mg total) by mouth at bedtime.   fluticasone  50 MCG/ACT nasal  spray Commonly known as: FLONASE  PLACE 2 SPRAYS INTO BOTH NOSTRILS AS NEEDED.   Fluticasone -Umeclidin-Vilant 100-62.5-25 MCG/ACT Aepb Commonly known as: Trelegy Ellipta  Inhale 1 puff into the lungs daily.   gabapentin  300 MG capsule Commonly known as: NEURONTIN  Take 1 capsule (300 mg total) by mouth 3 (three) times daily.   loratadine  10 MG tablet Commonly known as: CLARITIN  Take 1 tablet (10 mg total) by mouth daily as needed (cough, allergies).    metFORMIN  750 MG 24 hr tablet Commonly known as: GLUCOPHAGE -XR Take 1 tablet (750 mg total) by mouth daily with breakfast.   ondansetron  4 MG disintegrating tablet Commonly known as: ZOFRAN -ODT Take 1 tablet (4 mg total) by mouth every 8 (eight) hours as needed for nausea or vomiting.   pantoprazole  40 MG tablet Commonly known as: PROTONIX  TAKE 1 TABLET BY MOUTH EVERY DAY   rosuvastatin  10 MG tablet Commonly known as: CRESTOR  Take 1 tablet (10 mg total) by mouth daily.   Vitamin B-12 1000 MCG Subl Place 1 tablet (1,000 mcg total) under the tongue 3 (three) times a week.   Vitamin D  (Ergocalciferol ) 1.25 MG (50000 UNIT) Caps capsule Commonly known as: DRISDOL  Take 1 capsule (50,000 Units total) by mouth every 7 (seven) days.   zolpidem  10 MG tablet Commonly known as: AMBIEN  Take 1 tablet (10 mg total) by mouth at bedtime.       Allergies[1]  Follow-up Information     Sowles, Krichna, MD Follow up.   Specialty: Family Medicine Why: 1 week Contact information: [address] [place] [address] [phone]                  The results of significant diagnostics from this hospitalization (including imaging, microbiology, ancillary and laboratory) are listed below for reference.    Significant Diagnostic Studies: CT RENAL STONE STUDY Result Date: 07/01/2024 CLINICAL DATA:  Left-sided abdominal and flank pain. EXAM: CT ABDOMEN AND PELVIS WITHOUT CONTRAST TECHNIQUE: Multidetector CT imaging of the abdomen and pelvis was performed following the standard protocol without IV contrast. RADIATION DOSE REDUCTION: This exam was performed according to the departmental dose-optimization program which includes automated exposure control, adjustment of the mA and/or kV according to patient size and/or use of iterative reconstruction technique. COMPARISON:  09/18/2011 FINDINGS: Lower chest: No acute findings. Hepatobiliary: Stable small cyst again seen in the  left lobe. No other liver lesions visualized on this unenhanced exam. Gallbladder is unremarkable. No evidence of biliary ductal dilatation. Pancreas: No mass or inflammatory process visualized on this unenhanced exam. Spleen:  Within normal limits in size. Adrenals/Urinary tract: Diffuse left renal swelling and perinephric soft tissue stranding are seen. Mild left renal pelvicaliectasis is noted, however, there is no evidence of ureteral dilatation. No urinary tract calculi identified. Unremarkable unopacified urinary bladder. Stomach/Bowel: Tiny hiatal hernia noted. No evidence of obstruction, inflammatory process, or abnormal fluid collections. Vascular/Lymphatic: No pathologically enlarged lymph nodes identified. No evidence of abdominal aortic aneurysm. Reproductive:  No mass or other significant abnormality. Other:  Stable small fat-containing umbilical hernia. Musculoskeletal:  No suspicious bone lesions identified. IMPRESSION: Diffuse left renal swelling and perinephric stranding, with mild pelvicaliectasis. No evidence of ureteral calculi or dilatation. Differential diagnosis includes recently passed calculus and pyelonephritis. Suggest correlation with urinalysis. Stable small fat-containing umbilical hernia, and tiny hiatal hernia. Electronically Signed   By: Norleen DELENA Kil M.D.   On: 07/01/2024 11:56    Microbiology: Recent Results (from the past 240 hours)  Culture, blood (Routine X 2) w  Reflex to ID Panel     Status: None (Preliminary result)   Collection Time: 07/01/24  5:41 PM   Specimen: BLOOD  Result Value Ref Range Status   Specimen Description BLOOD BLOOD RIGHT ARM  Final   Special Requests   Final    BOTTLES DRAWN AEROBIC AND ANAEROBIC Blood Culture adequate volume   Culture   Final    NO GROWTH < 24 HOURS Performed at [facility], [address], [place], [place] [address]    Report Status PENDING  Incomplete  Culture, blood (Routine X 2) w Reflex to ID Panel      Status: None (Preliminary result)   Collection Time: 07/01/24  5:48 PM   Specimen: BLOOD  Result Value Ref Range Status   Specimen Description BLOOD BLOOD RIGHT HAND  Final   Special Requests   Final    BOTTLES DRAWN AEROBIC AND ANAEROBIC Blood Culture adequate volume   Culture   Final    NO GROWTH < 24 HOURS Performed at [facility], [address] [place], [place] [address]    Report Status PENDING  Incomplete     Labs: Basic Metabolic Panel: Recent Labs  Lab 07/01/24 1005 07/01/24 1741 07/02/24 0501  NA 134*  --  134*  K 3.3*  --  3.3*  CL 96*  --  98  CO2 24  --  23  GLUCOSE 97  --  104*  BUN 16  --  12  CREATININE 1.05* 0.88 0.81  CALCIUM  9.5  --  8.9  MG  --  2.1  --    Liver Function Tests: Recent Labs  Lab 07/01/24 1005  AST 62*  ALT 62*  ALKPHOS 129*  BILITOT 0.9  PROT 7.7  ALBUMIN 3.7   No results for input(s): LIPASE, AMYLASE in the last 168 hours. No results for input(s): AMMONIA in the last 168 hours. CBC: Recent Labs  Lab 07/01/24 1005 07/01/24 1741 07/02/24 0501  WBC 11.0* 9.9 8.8  NEUTROABS 7.2  --   --   HGB 13.2 12.4 11.7*  HCT 39.4 36.3 34.1*  MCV 81.1 78.6* 79.3*  PLT 206 196 208   Cardiac Enzymes: No results for input(s): CKTOTAL, CKMB, CKMBINDEX, TROPONINI in the last 168 hours. BNP: BNP (last 3 results) No results for input(s): BNP in the last 8760 hours.  ProBNP (last 3 results) No results for input(s): PROBNP in the last 8760 hours.  CBG: Recent Labs  Lab 07/01/24 2152 07/02/24 0724  GLUCAP 128* 105*       Signed:  Devaughn KATHEE Ban MD.  [facility] 07/02/2024, 10:54 AM     [1]  Allergies Allergen Reactions   Ace Inhibitors Cough

## 2024-07-03 LAB — CULTURE, URINE COMPREHENSIVE
MICRO NUMBER:: 17338698
SPECIMEN QUALITY:: ADEQUATE

## 2024-07-06 LAB — CULTURE, BLOOD (ROUTINE X 2)
Culture: NO GROWTH
Culture: NO GROWTH
Special Requests: ADEQUATE
Special Requests: ADEQUATE

## 2024-07-07 ENCOUNTER — Encounter: Payer: Self-pay | Admitting: Family Medicine

## 2024-07-07 ENCOUNTER — Ambulatory Visit: Admitting: Family Medicine

## 2024-07-07 VITALS — BP 132/78 | HR 90 | Resp 16 | Ht 62.75 in | Wt 203.7 lb

## 2024-07-07 DIAGNOSIS — J449 Chronic obstructive pulmonary disease, unspecified: Secondary | ICD-10-CM

## 2024-07-07 DIAGNOSIS — E538 Deficiency of other specified B group vitamins: Secondary | ICD-10-CM | POA: Diagnosis not present

## 2024-07-07 DIAGNOSIS — E1159 Type 2 diabetes mellitus with other circulatory complications: Secondary | ICD-10-CM

## 2024-07-07 DIAGNOSIS — N12 Tubulo-interstitial nephritis, not specified as acute or chronic: Secondary | ICD-10-CM | POA: Diagnosis not present

## 2024-07-07 DIAGNOSIS — Z7984 Long term (current) use of oral hypoglycemic drugs: Secondary | ICD-10-CM | POA: Diagnosis not present

## 2024-07-07 DIAGNOSIS — E785 Hyperlipidemia, unspecified: Secondary | ICD-10-CM

## 2024-07-07 DIAGNOSIS — I152 Hypertension secondary to endocrine disorders: Secondary | ICD-10-CM

## 2024-07-07 DIAGNOSIS — G620 Drug-induced polyneuropathy: Secondary | ICD-10-CM

## 2024-07-07 DIAGNOSIS — E559 Vitamin D deficiency, unspecified: Secondary | ICD-10-CM

## 2024-07-07 DIAGNOSIS — G4700 Insomnia, unspecified: Secondary | ICD-10-CM

## 2024-07-07 DIAGNOSIS — E1169 Type 2 diabetes mellitus with other specified complication: Secondary | ICD-10-CM | POA: Diagnosis not present

## 2024-07-07 MED ORDER — GABAPENTIN 300 MG PO CAPS: 300.0000 mg | ORAL_CAPSULE | Freq: Three times a day (TID) | ORAL | 1 refills | Status: AC

## 2024-07-07 MED ORDER — METFORMIN HCL ER 750 MG PO TB24: 750.0000 mg | ORAL_TABLET | Freq: Every day | ORAL | 1 refills | Status: AC

## 2024-07-07 MED ORDER — VALSARTAN-HYDROCHLOROTHIAZIDE 320-12.5 MG PO TABS: 1.0000 | ORAL_TABLET | Freq: Every day | ORAL | 0 refills | Status: DC

## 2024-07-07 MED ORDER — ROSUVASTATIN CALCIUM 10 MG PO TABS: 10.0000 mg | ORAL_TABLET | Freq: Every day | ORAL | 1 refills | Status: AC

## 2024-07-07 MED ORDER — VITAMIN D (ERGOCALCIFEROL) 1.25 MG (50000 UNIT) PO CAPS: 50000.0000 [IU] | ORAL_CAPSULE | ORAL | 1 refills | Status: AC

## 2024-07-07 NOTE — Progress Notes (Signed)
 Name: Brenda Jones   MRN: [id number]    DOB: [birth year]   Date:07/07/2024       Progress Note  Subjective  Chief Complaint  Chief Complaint  Patient presents with   Hospitalization Follow-up    Feeling better   Discussed the use of AI scribe software for clinical note transcription with the patient, who gave verbal consent to proceed.  History of Present Illness Brenda Jones is a 58 year old female with hypertension and type 2 diabetes who presents for follow-up after hospitalization for pyelonephritis.  She was hospitalized on July 01, 2024, for pyelonephritis, presenting with flank pain, nausea, and lack of appetite. A CT scan confirmed the kidney infection, and she was treated with IV antibiotics, specifically ceftriaxone , during her 23-hour hospital stay. She was discharged on July 02, 2024, with oral cefpodoxime  to complete the antibiotic course, which she is nearing completion of, with two days remaining.  During her hospitalization, her blood pressure medication, valsartan  HCTZ, was paused due to low blood pressure readings. Her blood pressure was 119/72 mmHg at discharge, and she has not resumed the medication. She has been on a dose of 320/12.5 mg for a long time, which she usually takes in the morning.  She has a history of type 2 diabetes and paused metformin  during her hospital stay. She has not resumed it yet, pending today's follow-up. Her kidney function was stable during hospitalization.  She also has a history of dyslipidemia, for which she takes rosuvastatin , and mild COPD, for which she uses Trelegy as needed. She experiences chemotherapy-induced neuropathy, for which she takes gabapentin  as needed. She also has a history of breast cancer treated in 2013, with regular follow-ups for mammograms and Pap smears.  Her insomnia is managed with Ambien , which she takes nightly at 10 mg. She has a prescription for vitamin D , which she prefers to take as a prescription.  No current symptoms of weakness, diarrhea, or burning during urination. Reports decreased coughing since hospital discharge. She continues to smoke and is not ready to quit.    Patient Active Problem List   Diagnosis Date Noted   Pyelonephritis 07/01/2024   Hypertension associated with type 2 diabetes mellitus (HCC) 06/12/2023   Vitamin D  deficiency 05/10/2022   History of colonic polyps    Polyp of transverse colon    Polyp of sigmoid colon    History of breast cancer 06/24/2019   Osteoarthritis of right knee 08/29/2016   Osteopenia due to cancer therapy 04/18/2015   GERD (gastroesophageal reflux disease) 04/18/2015   Muscle spasm 04/18/2015   Benign neoplasm of descending colon    Cervical high risk HPV (human papillomavirus) test positive 03/22/2015   Benign essential HTN 01/08/2015   Insomnia, persistent 01/08/2015   COPD, mild (HCC) 01/08/2015   Hyperlipidemia associated with type 2 diabetes mellitus (HCC) 01/08/2015   Dysmetabolic syndrome 01/08/2015   Class II obesity 01/08/2015   Seasonal allergic rhinitis 01/08/2015   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   Arthralgia of multiple joints 01/08/2015    Past Surgical History:  Procedure Laterality Date   BLADDER SURGERY  2007   BREAST BIOPSY Left 2013   invasive mammary carcinoma   BREAST BIOPSY Left 04/30/2023   stereo bx, LEFT breast asymmetry, X clip-path pending   BREAST BIOPSY Left 04/30/2023   MM LT BREAST BX W LOC DEV 1ST LESION IMAGE BX SPEC STEREO GUIDE 04/30/2023 [facility]   BREAST LUMPECTOMY Left 2013   invasive mammary carcinoma with 2  positive lymph nodes. Margins were clear.    BREAST SURGERY     COLONOSCOPY WITH PROPOFOL  N/A 04/04/2015   Procedure: COLONOSCOPY WITH PROPOFOL ;  Surgeon: Rogelia Copping, MD;  Location: [facility];  Service: Endoscopy;  Laterality: N/A;   COLONOSCOPY WITH PROPOFOL  N/A 07/19/2020   Procedure: COLONOSCOPY WITH PROPOFOL ;  Surgeon: Copping Rogelia,  MD;  Location: [facility];  Service: Endoscopy;  Laterality: N/A;   POLYPECTOMY  04/04/2015   Procedure: POLYPECTOMY;  Surgeon: Rogelia Copping, MD;  Location: [facility];  Service: Endoscopy;;    Family History  Problem Relation Age of Onset   Heart disease Mother    Diabetes Father    Hypertension Father    Breast cancer Neg Hx     Social History   Tobacco Use   Smoking status: Every Day    Current packs/day: 0.75    Average packs/day: 0.8 packs/day for 43.0 years (32.3 ttl pk-yrs)    Types: Cigarettes    Start date: 06/27/1981   Smokeless tobacco: Never  Substance Use Topics   Alcohol use: No    Alcohol/week: 0.0 standard drinks of alcohol    Current Medications[1]  Allergies[2]  I personally reviewed active problem list, medication list, allergies, family history with the patient/caregiver today.   ROS  Ten systems reviewed and is negative except as mentioned in HPI    Objective Physical Exam VITALS: BP- 132/78 CONSTITUTIONAL: Patient appears well-developed and well-nourished. No distress. HEENT: Head atraumatic, normocephalic, neck supple. CARDIOVASCULAR: Normal rate, regular rhythm and normal heart sounds. No murmur heard. No BLE edema. PULMONARY: Effort normal and breath sounds normal. No respiratory distress. ABDOMINAL: Abdomen non-tender. No costovertebral angle tenderness. No distention. MUSCULOSKELETAL: Normal gait. Without gross motor or sensory deficit. PSYCHIATRIC: Patient has a normal mood and affect. Behavior is normal. Judgment and thought content normal.  Vitals:   07/07/24 0948  BP: 132/78  Pulse: 90  Resp: 16  SpO2: 94%  Weight: 203 lb 11.2 oz (92.4 kg)  Height: 5' 2.75 (1.594 m)    Body mass index is 36.37 kg/m.  Recent Results (from the past 2160 hours)  POCT glycosylated hemoglobin (Hb A1C)     Status: Abnormal   Collection Time: 07/01/24  8:27 AM  Result Value Ref Range   Hemoglobin A1C 5.8 (A) 4.0 - 5.6 %   HbA1c POC  (<> result, manual entry)     HbA1c, POC (prediabetic range)     HbA1c, POC (controlled diabetic range)    POC Covid19/Flu A&B Antigen     Status: None   Collection Time: 07/01/24  8:27 AM  Result Value Ref Range   Influenza A Antigen, POC Negative Negative   Influenza B Antigen, POC Negative Negative   Covid Antigen, POC Negative Negative  POCT URINE DIPSTICK     Status: Abnormal   Collection Time: 07/01/24  9:16 AM  Result Value Ref Range   Color, UA brown (A) yellow   Clarity, UA turbid (A) clear   Glucose, UA negative negative mg/dL   Bilirubin, UA negative negative   Ketones, POC UA moderate (40) (A) negative mg/dL   Spec Grav, UA 8.974 8.989 - 1.025   Blood, UA large (A) negative   pH, UA 6.0 5.0 - 8.0   POC PROTEIN,UA =100 (A) negative, trace   Urobilinogen, UA 2.0 (A) 0.2 or 1.0 E.U./dL   Nitrite, UA Negative Negative   Leukocytes, UA Moderate (2+) (A) Negative  Comprehensive Metabolic Panel (CMET)     Status:  Abnormal   Collection Time: 07/01/24 10:05 AM  Result Value Ref Range   Sodium 134 (L) 135 - 145 mmol/L   Potassium 3.3 (L) 3.5 - 5.1 mmol/L   Chloride 96 (L) 98 - 111 mmol/L   CO2 24 22 - 32 mmol/L   Glucose, Bld 97 70 - 99 mg/dL    Comment: Glucose reference range applies only to samples taken after fasting for at least 8 hours.   BUN 16 6 - 20 mg/dL   Creatinine, Ser 8.94 (H) 0.44 - 1.00 mg/dL   Calcium  9.5 8.9 - 10.3 mg/dL   Total Protein 7.7 6.5 - 8.1 g/dL   Albumin 3.7 3.5 - 5.0 g/dL   AST 62 (H) 15 - 41 U/L   ALT 62 (H) 0 - 44 U/L   Alkaline Phosphatase 129 (H) 38 - 126 U/L   Total Bilirubin 0.9 0.0 - 1.2 mg/dL   GFR, Estimated >39 >39 mL/min    Comment: (NOTE) Calculated using the CKD-EPI Creatinine Equation (2021)    Anion gap 14 5 - 15    Comment: Performed at [facility], [address] [place], [place] [address]  CBC with Differential/Platelet     Status: Abnormal   Collection Time: 07/01/24 10:05 AM  Result Value Ref Range    WBC 11.0 (H) 4.0 - 10.5 K/uL   RBC 4.86 3.87 - 5.11 MIL/uL   Hemoglobin 13.2 12.0 - 15.0 g/dL   HCT 60.5 63.9 - 53.9 %   MCV 81.1 80.0 - 100.0 fL   MCH 27.2 26.0 - 34.0 pg   MCHC 33.5 30.0 - 36.0 g/dL   RDW 84.3 (H) 88.4 - 84.4 %   Platelets 206 150 - 400 K/uL   nRBC 0.0 0.0 - 0.2 %   Neutrophils Relative % 64 %   Neutro Abs 7.2 1.7 - 7.7 K/uL   Lymphocytes Relative 21 %   Lymphs Abs 2.3 0.7 - 4.0 K/uL   Monocytes Relative 12 %   Monocytes Absolute 1.3 (H) 0.1 - 1.0 K/uL   Eosinophils Relative 1 %   Eosinophils Absolute 0.1 0.0 - 0.5 K/uL   Basophils Relative 1 %   Basophils Absolute 0.1 0.0 - 0.1 K/uL   Immature Granulocytes 1 %   Abs Immature Granulocytes 0.06 0.00 - 0.07 K/uL    Comment: Performed at [facility], [address] [place], [place] [address]  CULTURE, URINE COMPREHENSIVE     Status: Abnormal   Collection Time: 07/01/24 10:18 AM   Specimen: Urine  Result Value Ref Range   MICRO NUMBER: [id number]    SPECIMEN QUALITY: Adequate    Source OTHER (SPECIFY)    STATUS: FINAL    ISOLATE 1: Proteus mirabilis (A)     Comment: 10,000-49,000 CFU/mL of Proteus mirabilis      Susceptibility   Proteus mirabilis - CULT, URN, SPECIAL NEGATIVE 1    AMOX/CLAVULANIC <=2 Sensitive     AMPICILLIN/SULBACTAM <=2 Sensitive     CEFTAZIDIME <=0.5 Sensitive     CEFEPIME <=0.12 Sensitive     CEFTRIAXONE  <=0.25 Sensitive     CIPROFLOXACIN <=0.06 Sensitive     LEVOFLOXACIN <=0.12 Sensitive     GENTAMICIN <=1 Sensitive     MEROPENEM <=0.25 Sensitive     NITROFURANTOIN 64 Resistant     PIP/TAZO <=4 Sensitive     TRIMETH/SULFA* <=20 Sensitive      * Legend: S = Susceptible  I = Intermediate R = Resistant  NS = Not  susceptible SDD = Susceptible Dose Dependent * = Not Tested  NR = Not Reported **NN = See Therapy Comments   HIV Antibody (routine testing w rflx)     Status: None   Collection Time: 07/01/24  5:41 PM  Result Value Ref Range   HIV Screen 4th Generation wRfx  Non Reactive Non Reactive    Comment: Performed at [facility], [address], [place], [place] [address]  Magnesium      Status: None   Collection Time: 07/01/24  5:41 PM  Result Value Ref Range   Magnesium  2.1 1.7 - 2.4 mg/dL    Comment: Performed at [facility], [address] [place], [place] [address]  CBC     Status: Abnormal   Collection Time: 07/01/24  5:41 PM  Result Value Ref Range   WBC 9.9 4.0 - 10.5 K/uL   RBC 4.62 3.87 - 5.11 MIL/uL   Hemoglobin 12.4 12.0 - 15.0 g/dL   HCT 63.6 63.9 - 53.9 %   MCV 78.6 (L) 80.0 - 100.0 fL   MCH 26.8 26.0 - 34.0 pg   MCHC 34.2 30.0 - 36.0 g/dL   RDW 84.7 88.4 - 84.4 %   Platelets 196 150 - 400 K/uL   nRBC 0.0 0.0 - 0.2 %    Comment: Performed at [facility], [address] [place], [place] [address]  Creatinine, serum     Status: None   Collection Time: 07/01/24  5:41 PM  Result Value Ref Range   Creatinine, Ser 0.88 0.44 - 1.00 mg/dL   GFR, Estimated >39 >39 mL/min    Comment: (NOTE) Calculated using the CKD-EPI Creatinine Equation (2021) Performed at [facility], [address] [place], [place] [address]   Culture, blood (Routine X 2) w Reflex to ID Panel     Status: None   Collection Time: 07/01/24  5:41 PM   Specimen: BLOOD  Result Value Ref Range   Specimen Description BLOOD BLOOD RIGHT ARM    Special Requests      BOTTLES DRAWN AEROBIC AND ANAEROBIC Blood Culture adequate volume   Culture      NO GROWTH 5 DAYS Performed at [facility], [address], [place], [place] [address]    Report Status 07/06/2024 FINAL   Culture, blood (Routine X 2) w Reflex to ID Panel     Status: None   Collection Time: 07/01/24  5:48 PM   Specimen: BLOOD  Result Value Ref Range   Specimen Description BLOOD BLOOD RIGHT HAND    Special Requests      BOTTLES DRAWN AEROBIC AND ANAEROBIC Blood Culture adequate volume   Culture      NO GROWTH 5 DAYS Performed at [facility],  [address] [place], [place] [address]    Report Status 07/06/2024 FINAL   Glucose, capillary     Status: Abnormal   Collection Time: 07/01/24  9:52 PM  Result Value Ref Range   Glucose-Capillary 128 (H) 70 - 99 mg/dL    Comment: Glucose reference range applies only to samples taken after fasting for at least 8 hours.  Basic metabolic panel     Status: Abnormal   Collection Time: 07/02/24  5:01 AM  Result Value Ref Range   Sodium 134 (L) 135 - 145 mmol/L   Potassium 3.3 (L) 3.5 - 5.1 mmol/L   Chloride 98 98 - 111 mmol/L   CO2 23 22 - 32 mmol/L   Glucose, Bld  104 (H) 70 - 99 mg/dL    Comment: Glucose reference range applies only to samples taken after fasting for at least 8 hours.   BUN 12 6 - 20 mg/dL   Creatinine, Ser 9.18 0.44 - 1.00 mg/dL   Calcium  8.9 8.9 - 10.3 mg/dL   GFR, Estimated >39 >39 mL/min    Comment: (NOTE) Calculated using the CKD-EPI Creatinine Equation (2021)    Anion gap 12 5 - 15    Comment: Performed at [facility], [address] [place], [place] [address]  CBC     Status: Abnormal   Collection Time: 07/02/24  5:01 AM  Result Value Ref Range   WBC 8.8 4.0 - 10.5 K/uL   RBC 4.30 3.87 - 5.11 MIL/uL   Hemoglobin 11.7 (L) 12.0 - 15.0 g/dL   HCT 65.8 (L) 63.9 - 53.9 %   MCV 79.3 (L) 80.0 - 100.0 fL   MCH 27.2 26.0 - 34.0 pg   MCHC 34.3 30.0 - 36.0 g/dL   RDW 84.5 88.4 - 84.4 %   Platelets 208 150 - 400 K/uL   nRBC 0.0 0.0 - 0.2 %    Comment: Performed at [facility], [address] [place], [place] [address]  Glucose, capillary     Status: Abnormal   Collection Time: 07/02/24  7:24 AM  Result Value Ref Range   Glucose-Capillary 105 (H) 70 - 99 mg/dL    Comment: Glucose reference range applies only to samples taken after fasting for at least 8 hours.   Comment 1 Notify RN    Comment 2 Document in Chart   Glucose, capillary     Status: Abnormal   Collection Time: 07/02/24 11:59 AM  Result Value Ref Range   Glucose-Capillary  253 (H) 70 - 99 mg/dL    Comment: Glucose reference range applies only to samples taken after fasting for at least 8 hours.   Comment 1 Notify RN    Comment 2 Document in Chart      PHQ2/9:    07/07/2024    9:48 AM 07/01/2024    8:04 AM 02/11/2024    8:45 AM 02/04/2024    1:23 PM 12/09/2023    7:57 AM  Depression screen PHQ 2/9  Decreased Interest 0 0 0 0 0  Down, Depressed, Hopeless 0 0 0 0 0  PHQ - 2 Score 0 0 0 0 0    phq 9 is negative Assessment & Plan Pyelonephritis Recent hospitalization treated with IV ceftriaxone , now on oral cefpodoxime . Symptoms improved. - Rechecked urine culture to ensure infection clearance. - Continue cefpodoxime  for the remaining two days.  Hypertension associated with type 2 diabetes mellitus Blood pressure stabilized at 132/78 mmHg after temporary discontinuation of valsartan  HCTZ. - Resume valsartan  HCTZ at half the usual dose (160/12.5 mg). - Return for a nurse visit in one week for blood pressure check. - Monitor blood pressure at home if possible.  Hyperlipidemia associated with type 2 diabetes mellitus Managed with rosuvastatin . Recent labs in May showed no need for changes. - Continue rosuvastatin  as prescribed.  Chronic obstructive pulmonary disease, mild Mild COPD with productive cough, managed with Trelegy. - Continue Trelegy as needed.  Peripheral neuropathy due to chemotherapy Managed with gabapentin  for burning and pain. - Continue gabapentin  as needed.  Insomnia, persistent Managed with Ambien  10 mg at night. - Continue Ambien  10 mg at night.  Vitamin D  deficiency Managed with prescription vitamin D  supplementation. - Continue prescription vitamin D  supplementation.  General Health Maintenance Pap smear and mammogram up to date. - Continue routine health maintenance schedule.        [1]  Current Outpatient Medications:    cefpodoxime  (VANTIN ) 200 MG tablet, Take 1 tablet (200 mg total) by mouth 2 (two) times  daily for 6 days., Disp: 12 tablet, Rfl: 0   Cyanocobalamin  (VITAMIN B-12) 1000 MCG SUBL, Place 1 tablet (1,000 mcg total) under the tongue 3 (three) times a week., Disp: 100 tablet, Rfl: 1   cyclobenzaprine  (FLEXERIL ) 10 MG tablet, Take 1 tablet (10 mg total) by mouth at bedtime., Disp: 90 tablet, Rfl: 1   fluticasone  (FLONASE ) 50 MCG/ACT nasal spray, PLACE 2 SPRAYS INTO BOTH NOSTRILS AS NEEDED., Disp: 48 mL, Rfl: 1   gabapentin  (NEURONTIN ) 300 MG capsule, Take 1 capsule (300 mg total) by mouth 3 (three) times daily., Disp: 270 capsule, Rfl: 1   loratadine  (CLARITIN ) 10 MG tablet, Take 1 tablet (10 mg total) by mouth daily as needed (cough, allergies)., Disp: 30 tablet, Rfl: 2   metFORMIN  (GLUCOPHAGE -XR) 750 MG 24 hr tablet, Take 1 tablet (750 mg total) by mouth daily with breakfast., Disp: 90 tablet, Rfl: 1   rosuvastatin  (CRESTOR ) 10 MG tablet, Take 1 tablet (10 mg total) by mouth daily., Disp: 90 tablet, Rfl: 1   [Paused] valsartan -hydrochlorothiazide  (DIOVAN -HCT) 320-12.5 MG tablet, Take 1 tablet by mouth daily., Disp: 90 tablet, Rfl: 1   Vitamin D , Ergocalciferol , (DRISDOL ) 1.25 MG (50000 UNIT) CAPS capsule, Take 1 capsule (50,000 Units total) by mouth every 7 (seven) days., Disp: 12 capsule, Rfl: 1   zolpidem  (AMBIEN ) 10 MG tablet, Take 1 tablet (10 mg total) by mouth at bedtime., Disp: 90 tablet, Rfl: 1   Fluticasone -Umeclidin-Vilant (TRELEGY ELLIPTA ) 100-62.5-25 MCG/ACT AEPB, Inhale 1 puff into the lungs daily. (Patient not taking: Reported on 07/07/2024), Disp: 60 each, Rfl: 5   ondansetron  (ZOFRAN -ODT) 4 MG disintegrating tablet, Take 1 tablet (4 mg total) by mouth every 8 (eight) hours as needed for nausea or vomiting. (Patient not taking: Reported on 07/07/2024), Disp: 20 tablet, Rfl: 0   pantoprazole  (PROTONIX ) 40 MG tablet, TAKE 1 TABLET BY MOUTH EVERY DAY (Patient not taking: Reported on 07/07/2024), Disp: 90 tablet, Rfl: 0 [2]  Allergies Allergen Reactions   Ace Inhibitors Cough

## 2024-07-09 LAB — CULTURE, URINE COMPREHENSIVE
MICRO NUMBER:: 17362392
SPECIMEN QUALITY:: ADEQUATE

## 2024-07-10 ENCOUNTER — Other Ambulatory Visit: Payer: Self-pay | Admitting: Family Medicine

## 2024-07-10 ENCOUNTER — Ambulatory Visit: Payer: Self-pay | Admitting: Family Medicine

## 2024-07-10 MED ORDER — SULFAMETHOXAZOLE-TRIMETHOPRIM 400-80 MG PO TABS: 1.0000 | ORAL_TABLET | Freq: Two times a day (BID) | ORAL | 0 refills | Status: AC

## 2024-08-09 ENCOUNTER — Other Ambulatory Visit: Payer: Self-pay | Admitting: Family Medicine

## 2024-08-09 DIAGNOSIS — M791 Myalgia, unspecified site: Secondary | ICD-10-CM

## 2024-08-10 ENCOUNTER — Other Ambulatory Visit: Payer: Self-pay | Admitting: Family Medicine

## 2024-08-10 DIAGNOSIS — I10 Essential (primary) hypertension: Secondary | ICD-10-CM

## 2024-08-10 NOTE — Telephone Encounter (Signed)
 Requested medication (s) are due for refill today: yes  Requested medication (s) are on the active medication list: yes  Last refill:  02/11/24  Future visit scheduled: yes  Notes to clinic:  Unable to refill per protocol, cannot delegate.      Requested Prescriptions  Pending Prescriptions Disp Refills   cyclobenzaprine  (FLEXERIL ) 10 MG tablet [Pharmacy Med Name: CYCLOBENZAPRINE  10 MG TABLET] 90 tablet 1    Sig: TAKE 1 TABLET BY MOUTH EVERYDAY AT BEDTIME     Not Delegated - Analgesics:  Muscle Relaxants Failed - 08/10/2024  2:37 PM      Failed - This refill cannot be delegated      Passed - Valid encounter within last 6 months    Recent Outpatient Visits           1 month ago Pyelonephritis   [facility] Glenard Mire, MD   1 month ago Left flank pain   [facility] [place], Mire, MD   6 months ago Hypertension associated with type 2 diabetes mellitus [facility])   [facility] Glenard Mire, MD   6 months ago COPD exacerbation [facility])   [facility] Glenard Mire, MD   8 months ago Well adult exam   [facility] Glenard Mire, MD       Future Appointments             In 4 months Glenard, Krichna, MD [facility], [place]

## 2024-08-11 NOTE — Telephone Encounter (Signed)
 Requested Prescriptions  Pending Prescriptions Disp Refills   valsartan -hydrochlorothiazide  (DIOVAN -HCT) 320-12.5 MG tablet [Pharmacy Med Name: VALSARTAN -HCTZ 320-12.5 MG TAB] 90 tablet 1    Sig: TAKE 1 TABLET BY MOUTH EVERY DAY     Cardiovascular: ARB + Diuretic Combos Failed - 08/11/2024  8:02 AM      Failed - K in normal range and within 180 days    Potassium  Date Value Ref Range Status  07/02/2024 3.3 (L) 3.5 - 5.1 mmol/L Final  01/25/2012 3.6 3.5 - 5.1 mmol/L Final         Failed - Na in normal range and within 180 days    Sodium  Date Value Ref Range Status  07/02/2024 134 (L) 135 - 145 mmol/L Final  01/25/2012 139 136 - 145 mmol/L Final         Passed - Cr in normal range and within 180 days    Creat  Date Value Ref Range Status  12/09/2023 0.77 0.50 - 1.03 mg/dL Final   Creatinine, Ser  Date Value Ref Range Status  07/02/2024 0.81 0.44 - 1.00 mg/dL Final   Creatinine, Urine  Date Value Ref Range Status  12/09/2023 31 20 - 275 mg/dL Final         Passed - eGFR is 10 or above and within 180 days    GFR, Est African American  Date Value Ref Range Status  12/14/2020 101 > OR = 60 mL/min/1.109m2 Final   GFR, Est Non African American  Date Value Ref Range Status  12/14/2020 87 > OR = 60 mL/min/1.28m2 Final   GFR, Estimated  Date Value Ref Range Status  07/02/2024 >60 >60 mL/min Final    Comment:    (NOTE) Calculated using the CKD-EPI Creatinine Equation (2021)    eGFR  Date Value Ref Range Status  12/09/2023 89 > OR = 60 mL/min/1.58m2 Final         Passed - Patient is not pregnant      Passed - Last BP in normal range    BP Readings from Last 1 Encounters:  07/07/24 132/78         Passed - Valid encounter within last 6 months    Recent Outpatient Visits           1 month ago Pyelonephritis   [facility] Glenard Mire, MD   1 month ago Left flank pain   [facility] Glenard Mire, MD   6  months ago Hypertension associated with type 2 diabetes mellitus [facility])    [facility] Glenard Mire, MD   6 months ago COPD exacerbation [facility])   [facility] Glenard Mire, MD   8 months ago Well adult exam   [facility] Glenard Mire, MD       Future Appointments             In 4 months Sowles, Krichna, MD [facility], [place]

## 2024-12-09 ENCOUNTER — Encounter: Admitting: Family Medicine
